# Patient Record
Sex: Male | Born: 1945 | Race: White | Hispanic: No | Marital: Married | Smoking: Never smoker
Health system: Southern US, Community
[De-identification: ages and names within clinical notes are randomized; demographics above are authoritative.]

## PROBLEM LIST (undated history)

## (undated) DIAGNOSIS — R351 Nocturia: Secondary | ICD-10-CM

## (undated) DIAGNOSIS — J302 Other seasonal allergic rhinitis: Secondary | ICD-10-CM

## (undated) DIAGNOSIS — K222 Esophageal obstruction: Secondary | ICD-10-CM

## (undated) DIAGNOSIS — M1990 Unspecified osteoarthritis, unspecified site: Secondary | ICD-10-CM

## (undated) DIAGNOSIS — G43909 Migraine, unspecified, not intractable, without status migrainosus: Secondary | ICD-10-CM

## (undated) DIAGNOSIS — Z91018 Allergy to other foods: Secondary | ICD-10-CM

## (undated) DIAGNOSIS — I10 Essential (primary) hypertension: Secondary | ICD-10-CM

## (undated) DIAGNOSIS — C61 Malignant neoplasm of prostate: Secondary | ICD-10-CM

## (undated) HISTORY — DX: Essential (primary) hypertension: I10

## (undated) HISTORY — PX: POLYPECTOMY: SHX149

## (undated) HISTORY — DX: Other seasonal allergic rhinitis: J30.2

## (undated) HISTORY — DX: Migraine, unspecified, not intractable, without status migrainosus: G43.909

## (undated) HISTORY — DX: Unspecified osteoarthritis, unspecified site: M19.90

## (undated) HISTORY — DX: Malignant neoplasm of prostate: C61

## (undated) HISTORY — DX: Esophageal obstruction: K22.2

---
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
## 2001-03-06 ENCOUNTER — Other Ambulatory Visit: Admission: RE | Admit: 2001-03-06 | Discharge: 2001-03-06 | Payer: Self-pay | Admitting: Gastroenterology

## 2001-03-06 ENCOUNTER — Encounter (INDEPENDENT_AMBULATORY_CARE_PROVIDER_SITE_OTHER): Payer: Self-pay

## 2002-01-31 ENCOUNTER — Encounter: Payer: Self-pay | Admitting: Internal Medicine

## 2003-03-27 ENCOUNTER — Ambulatory Visit (HOSPITAL_BASED_OUTPATIENT_CLINIC_OR_DEPARTMENT_OTHER): Admission: RE | Admit: 2003-03-27 | Discharge: 2003-03-27 | Payer: Self-pay | Admitting: Orthopedic Surgery

## 2003-03-27 HISTORY — PX: WRIST ARTHROSCOPY: SUR100

## 2004-11-26 ENCOUNTER — Ambulatory Visit: Payer: Self-pay | Admitting: Internal Medicine

## 2004-12-08 ENCOUNTER — Ambulatory Visit: Payer: Self-pay | Admitting: Internal Medicine

## 2005-01-13 ENCOUNTER — Ambulatory Visit: Payer: Self-pay | Admitting: Internal Medicine

## 2005-02-12 ENCOUNTER — Ambulatory Visit: Payer: Self-pay | Admitting: Internal Medicine

## 2005-02-16 ENCOUNTER — Ambulatory Visit: Payer: Self-pay | Admitting: Internal Medicine

## 2005-04-13 ENCOUNTER — Ambulatory Visit: Payer: Self-pay | Admitting: Gastroenterology

## 2005-05-03 ENCOUNTER — Ambulatory Visit: Payer: Self-pay | Admitting: Gastroenterology

## 2006-05-19 ENCOUNTER — Encounter (INDEPENDENT_AMBULATORY_CARE_PROVIDER_SITE_OTHER): Payer: Self-pay | Admitting: Specialist

## 2006-05-19 ENCOUNTER — Ambulatory Visit (HOSPITAL_COMMUNITY): Admission: RE | Admit: 2006-05-19 | Discharge: 2006-05-19 | Payer: Self-pay | Admitting: General Surgery

## 2006-05-19 HISTORY — PX: HEMORRHOID SURGERY: SHX153

## 2006-07-21 ENCOUNTER — Ambulatory Visit: Payer: Self-pay | Admitting: Internal Medicine

## 2006-12-05 ENCOUNTER — Ambulatory Visit: Payer: Self-pay | Admitting: Internal Medicine

## 2007-12-04 ENCOUNTER — Telehealth (INDEPENDENT_AMBULATORY_CARE_PROVIDER_SITE_OTHER): Payer: Self-pay | Admitting: *Deleted

## 2008-01-18 ENCOUNTER — Telehealth (INDEPENDENT_AMBULATORY_CARE_PROVIDER_SITE_OTHER): Payer: Self-pay | Admitting: *Deleted

## 2008-02-12 ENCOUNTER — Ambulatory Visit: Payer: Self-pay | Admitting: Internal Medicine

## 2008-02-12 DIAGNOSIS — E786 Lipoprotein deficiency: Secondary | ICD-10-CM

## 2008-02-12 DIAGNOSIS — R030 Elevated blood-pressure reading, without diagnosis of hypertension: Secondary | ICD-10-CM

## 2008-02-12 DIAGNOSIS — G43909 Migraine, unspecified, not intractable, without status migrainosus: Secondary | ICD-10-CM | POA: Insufficient documentation

## 2008-02-12 DIAGNOSIS — R51 Headache: Secondary | ICD-10-CM

## 2008-02-19 LAB — CONVERTED CEMR LAB
BUN: 12 mg/dL (ref 6–23)
PSA: 2.48 ng/mL (ref 0.10–4.00)

## 2008-02-20 ENCOUNTER — Encounter (INDEPENDENT_AMBULATORY_CARE_PROVIDER_SITE_OTHER): Payer: Self-pay | Admitting: *Deleted

## 2008-03-19 ENCOUNTER — Ambulatory Visit: Payer: Self-pay | Admitting: Internal Medicine

## 2008-03-19 ENCOUNTER — Encounter (INDEPENDENT_AMBULATORY_CARE_PROVIDER_SITE_OTHER): Payer: Self-pay | Admitting: *Deleted

## 2008-03-19 LAB — CONVERTED CEMR LAB
OCCULT 1: NEGATIVE
OCCULT 2: NEGATIVE
OCCULT 3: NEGATIVE

## 2008-12-02 ENCOUNTER — Ambulatory Visit: Payer: Self-pay | Admitting: Internal Medicine

## 2008-12-02 DIAGNOSIS — Z860100 Personal history of colon polyps, unspecified: Secondary | ICD-10-CM | POA: Insufficient documentation

## 2008-12-02 DIAGNOSIS — K5730 Diverticulosis of large intestine without perforation or abscess without bleeding: Secondary | ICD-10-CM

## 2008-12-03 ENCOUNTER — Ambulatory Visit: Payer: Self-pay | Admitting: Internal Medicine

## 2008-12-07 LAB — CONVERTED CEMR LAB
ALT: 17 units/L (ref 0–53)
Albumin: 3.9 g/dL (ref 3.5–5.2)
Alkaline Phosphatase: 50 units/L (ref 39–117)
Bilirubin, Direct: 0.1 mg/dL (ref 0.0–0.3)
Calcium: 9.1 mg/dL (ref 8.4–10.5)
Chloride: 110 meq/L (ref 96–112)
Cholesterol: 114 mg/dL (ref 0–200)
Creatinine, Ser: 1 mg/dL (ref 0.4–1.5)
Eosinophils Absolute: 0.2 10*3/uL (ref 0.0–0.7)
GFR calc non Af Amer: 81 mL/min
Glucose, Bld: 73 mg/dL (ref 70–99)
HDL: 30 mg/dL — ABNORMAL LOW (ref >39.0)
MCV: 91.2 fL (ref 78.0–100.0)
Monocytes Relative: 8.6 % (ref 3.0–12.0)
Neutro Abs: 3.2 10*3/uL (ref 1.4–7.7)
PSA: 2.07 ng/mL (ref 0.10–4.00)
Platelets: 167 10*3/uL (ref 150–400)
RBC: 4.47 M/uL (ref 4.22–5.81)
Total CHOL/HDL Ratio: 3.8
Triglycerides: 67 mg/dL (ref 0–149)

## 2008-12-09 ENCOUNTER — Encounter (INDEPENDENT_AMBULATORY_CARE_PROVIDER_SITE_OTHER): Payer: Self-pay | Admitting: *Deleted

## 2008-12-16 ENCOUNTER — Ambulatory Visit: Payer: Self-pay | Admitting: Internal Medicine

## 2008-12-16 LAB — CONVERTED CEMR LAB: OCCULT 3: NEGATIVE

## 2009-01-08 ENCOUNTER — Telehealth (INDEPENDENT_AMBULATORY_CARE_PROVIDER_SITE_OTHER): Payer: Self-pay | Admitting: *Deleted

## 2009-12-10 ENCOUNTER — Ambulatory Visit: Payer: Self-pay | Admitting: Internal Medicine

## 2009-12-22 ENCOUNTER — Telehealth (INDEPENDENT_AMBULATORY_CARE_PROVIDER_SITE_OTHER): Payer: Self-pay | Admitting: *Deleted

## 2009-12-27 DIAGNOSIS — D126 Benign neoplasm of colon, unspecified: Secondary | ICD-10-CM

## 2009-12-27 HISTORY — DX: Benign neoplasm of colon, unspecified: D12.6

## 2010-02-20 ENCOUNTER — Ambulatory Visit: Payer: Self-pay | Admitting: Family

## 2010-04-28 ENCOUNTER — Telehealth: Payer: Self-pay | Admitting: Gastroenterology

## 2010-05-14 ENCOUNTER — Encounter (INDEPENDENT_AMBULATORY_CARE_PROVIDER_SITE_OTHER): Payer: Self-pay | Admitting: *Deleted

## 2010-05-18 ENCOUNTER — Ambulatory Visit: Payer: Self-pay | Admitting: Gastroenterology

## 2010-05-29 ENCOUNTER — Ambulatory Visit: Payer: Self-pay | Admitting: Gastroenterology

## 2010-05-29 HISTORY — PX: COLONOSCOPY: SHX174

## 2010-06-01 ENCOUNTER — Encounter: Payer: Self-pay | Admitting: Gastroenterology

## 2010-06-15 ENCOUNTER — Telehealth (INDEPENDENT_AMBULATORY_CARE_PROVIDER_SITE_OTHER): Payer: Self-pay | Admitting: *Deleted

## 2010-09-11 ENCOUNTER — Encounter: Payer: Self-pay | Admitting: Internal Medicine

## 2010-10-19 ENCOUNTER — Telehealth (INDEPENDENT_AMBULATORY_CARE_PROVIDER_SITE_OTHER): Payer: Self-pay | Admitting: *Deleted

## 2010-11-27 ENCOUNTER — Telehealth: Payer: Self-pay | Admitting: Internal Medicine

## 2010-11-27 ENCOUNTER — Ambulatory Visit: Payer: Self-pay | Admitting: Internal Medicine

## 2010-11-27 LAB — CONVERTED CEMR LAB
Bilirubin Urine: NEGATIVE
Nitrite: NEGATIVE
Specific Gravity, Urine: <1.005
Urobilinogen, UA: 0.2
WBC Urine, dipstick: NEGATIVE

## 2010-11-30 ENCOUNTER — Encounter: Payer: Self-pay | Admitting: Internal Medicine

## 2010-12-01 ENCOUNTER — Ambulatory Visit: Payer: Self-pay | Admitting: Internal Medicine

## 2010-12-04 LAB — CONVERTED CEMR LAB
ALT: 22 units/L (ref 0–53)
Basophils Absolute: 0 10*3/uL (ref 0.0–0.1)
Basophils Relative: 0.8 % (ref 0.0–3.0)
Chloride: 109 meq/L (ref 96–112)
Cholesterol: 141 mg/dL (ref 0–200)
Eosinophils Relative: 2.3 % (ref 0.0–5.0)
GFR calc non Af Amer: 88.49 mL/min (ref >60.00)
Glucose, Bld: 87 mg/dL (ref 70–99)
HCT: 42.3 % (ref 39.0–52.0)
HDL: 28.8 mg/dL — ABNORMAL LOW (ref >39.00)
Hemoglobin: 14.6 g/dL (ref 13.0–17.0)
Lymphs Abs: 2 10*3/uL (ref 0.7–4.0)
MCV: 92.4 fL (ref 78.0–100.0)
PSA: 3.49 ng/mL (ref 0.10–4.00)
RDW: 13.4 % (ref 11.5–14.6)
Sodium: 141 meq/L (ref 135–145)
Total Bilirubin: 1 mg/dL (ref 0.3–1.2)
Total CHOL/HDL Ratio: 5
Triglycerides: 99 mg/dL (ref 0.0–149.0)
VLDL: 19.8 mg/dL (ref 0.0–40.0)
WBC: 5.7 10*3/uL (ref 4.5–10.5)

## 2010-12-10 ENCOUNTER — Telehealth (INDEPENDENT_AMBULATORY_CARE_PROVIDER_SITE_OTHER): Payer: Self-pay | Admitting: *Deleted

## 2010-12-24 ENCOUNTER — Telehealth (INDEPENDENT_AMBULATORY_CARE_PROVIDER_SITE_OTHER): Payer: Self-pay | Admitting: *Deleted

## 2010-12-29 ENCOUNTER — Ambulatory Visit
Admission: RE | Admit: 2010-12-29 | Discharge: 2010-12-29 | Payer: Self-pay | Source: Home / Self Care | Attending: Internal Medicine | Admitting: Internal Medicine

## 2011-01-24 LAB — CONVERTED CEMR LAB
Blood in Urine, dipstick: NEGATIVE
Glucose, Urine, Semiquant: NEGATIVE
Specific Gravity, Urine: <1.005
WBC Urine, dipstick: NEGATIVE

## 2011-01-28 NOTE — Progress Notes (Signed)
Summary: New RX request for travel   Phone Note Call from Patient Call back at [phone]   Caller: Patient Summary of Call: Message left on voicemail: [place] 13[place]14 [place], request rx for Dimox. Please call into [place] Initial call taken by: Shonna Chock CMA,  December 24, 2010 3:27 PM  Follow-up for Phone Call        Acetazolamide  XR 500 mg once daily as directed ( check to see how many he'll need for [place]. It should be same # as in 12/2008. Pharmacy should have on record # Rxed for him & his wife then. He'll obviously need only 1/2 that #)) Follow-up by: Marga Melnick MD,  December 25, 2010 5:11 AM  Additional Follow-up for Phone Call Additional follow up Details #1::        I called the patient's wife to inform her rx to be sent in and she indicated # 14 would be good for the dispense number although he may not need daily. Patient will be there for 2 weeks  Additional Follow-up by: Shonna Chock CMA,  December 25, 2010 12:31 PM    New/Updated Medications: ACETAZOLAMIDE 500 MG XR12H-CAP (ACETAZOLAMIDE) once daily as directed Prescriptions: ACETAZOLAMIDE 500 MG XR12H-CAP (ACETAZOLAMIDE) once daily as directed  #14 x 0   Entered by:   Shonna Chock CMA   Authorized by:   Marga Melnick MD   Signed by:   Shonna Chock CMA on 12/25/2010   Method used:   Printed then faxed to ...       [facility] (retail)       [address]       [place], [place]  [address]       Ph: [phone] or [phone]       Fax: [phone]   RxID:   [id number]

## 2011-01-28 NOTE — Progress Notes (Signed)
Summary: REFILL REQUEST  Phone Note Refill Request Call back at [phone] Message from:  Pharmacy on June 15, 2010 10:34 AM  Refills Requested: Medication #1:  TIAZAC 120 MG  CP24 1 by mouth qd   Dosage confirmed as above?Dosage Confirmed   Supply Requested: 3 months   Last Refilled: 02/27/2010 [facility]  Next Appointment Scheduled: NONE Initial call taken by: Lavell Islam,  June 15, 2010 10:34 AM    Prescriptions: TIAZAC 120 MG  CP24 (DILTIAZEM HCL ER BEADS) 1 by mouth qd  #90 x 1   Entered by:   Shonna Chock   Authorized by:   Marga Melnick MD   Signed by:   Shonna Chock on 06/15/2010   Method used:   Faxed to ...       [facility] (retail)       [address]       [place], [place]  [address]       Ph: [phone] or [phone]       Fax: [phone]   RxID:   [id number]

## 2011-01-28 NOTE — Procedures (Signed)
Summary: Colonoscopy  Patient: Brian [name] Note: All result statuses are Final unless otherwise noted.  Tests: (1) Colonoscopy (COL)   COL Colonoscopy           DONE (C)     [facility]     [address]     [place], [place]  [address]           COLONOSCOPY PROCEDURE REPORT           PATIENT:  [name], [name]  MR#:  [id number]     BIRTHDATE:  Jan 15, 1946, 63 yrs. old  GENDER:  male     ENDOSCOPIST:  Judie Petit T. Russella Dar, MD, [facility]           PROCEDURE DATE:  05/29/2010     PROCEDURE:  Colonoscopy with biopsy and snare polypectomy     ASA CLASS:  Class II     INDICATIONS:  1) Elevated Risk Screening, family Hx of     polyps-father with colon polyps at age 20.     MEDICATIONS:   Fentanyl 75 mcg IV, Versed 8 mg IV     DESCRIPTION OF PROCEDURE:   After the risks benefits and     alternatives of the procedure were thoroughly explained, informed     consent was obtained.  Digital rectal exam was performed and     revealed no abnormalities.   The LB PCF-H180AL [id number] endoscope     was introduced through the anus and advanced to the cecum, which     was identified by both the appendix and ileocecal valve, without     limitations.  The quality of the prep was excellent, using     MoviPrep.  The instrument was then slowly withdrawn as the colon     was fully examined.     <<PROCEDUREIMAGES>>     FINDINGS:  A sessile polyp was found in the ascending colon. It     was 5 mm in size. Polyp was snared without cautery. Retrieval was     successful. A sessile polyp was found in the distal transverse     colon. It was 3 mm in size. The polyp was removed using cold     biopsy forceps. Moderate diverticulosis was found in the sigmoid     colon. This was otherwise a normal examination of the colon.     Retroflexed views in the rectum revealed internal hemorrhoids,     small. The time to cecum =  2.33  minutes. The scope was then     withdrawn (time =  13  min) from the patient and the  procedure     completed.           COMPLICATIONS:  None           ENDOSCOPIC IMPRESSION:     1) 5 mm sessile polyp in the ascending colon     2) 3 mm sessile polyp in the distal transverse colon     3) Moderate diverticulosis in the sigmoid colon     4) Internal hemorrhoids           RECOMMENDATIONS:     1) Await pathology results     2) High fiber diet with liberal fluid intake.     3) Repeat Colonoscopy in 5 years pending pathology review.           Venita Lick. Russella Dar, MD, [facility]           CC: Pecola Lawless, MD  n.     REVISED:  05/29/2010 10:32 AM     eSIGNED:   Judie Petit T. Scout Guyett at 05/29/2010 10:32 AM           Consuello Closs, [id number]  Note: An exclamation mark (!) indicates a result that was not dispersed into the flowsheet. Document Creation Date: 05/29/2010 10:32 AM _______________________________________________________________________  (1) Order result status: Final Collection or observation date-time: 05/29/2010 10:16 Requested date-time:  Receipt date-time:  Reported date-time:  Referring Physician:   Ordering Physician: Claudette Head [id number]) Specimen Source:  Source: Launa Grill Order Number: [id number] Lab site:   Appended Document: Colonoscopy     Procedures Next Due Date:    Colonoscopy: 05/2015

## 2011-01-28 NOTE — Progress Notes (Signed)
Summary: Triage   Phone Note Call from Patient Call back at Work Phone [phone]   Caller: Patient Call For: Dr. Russella Dar Reason for Call: Talk to Nurse Summary of Call: pt.'s recall colon is not until 2016. He is sch'd for June 2011 and wants to know if he can keep his appt. and have his procedure this year. Initial call taken by: Karna Christmas,  Apr 28, 2010 12:51 PM  Follow-up for Phone Call        You recently changed his recall from this year to 2016 .  He had already scheduled a colon prior to your reccall review and would like to go ahead with it.  He has no current GI problems or complaints.  He has also discussed with Dr Alwyn Ren and he has recommended he go through with scheduled colon.  I have put the chart back in your office if you need to review further. Patient's wife was recently diagnosed with 2 seperate types of cancer and is very anxious for himself.  Please advise if this is a problem. Follow-up by: Darcey Nora RN, CGRN,  Apr 28, 2010 2:02 PM  Additional Follow-up for Phone Call Additional follow up Details #1::        After reviewing his colonoscopy reports and pathology reports from 04/2005, 04/2002 and 02/2001 and, no adenomatous (precancerous) polyps have been found so the guidelines would recommend a 10 year screening interval. A 5 year interval would be recommended if he had an adenomatous polyp. If he would like to go ahead with a screening colonoscopy sooner this time that is OK with me. Additional Follow-up by: Meryl Dare MD [facility],  Apr 28, 2010 3:32 PM    Additional Follow-up for Phone Call Additional follow up Details #2::    patient would like to proceed with colon as scheduled. Follow-up by: Darcey Nora RN, CGRN,  Apr 28, 2010 3:35 PM

## 2011-01-28 NOTE — Progress Notes (Signed)
Summary: Refill   Phone Note Refill Request Message from:  Fax from Pharmacy on December 10, 2010 12:02 PM  Refills Requested: Medication #1:  TIAZAC 120 MG  CP24 1 by mouth qd [facility] - fax [phone]  Initial call taken by: Okey Regal Spring,  December 10, 2010 12:03 PM    Prescriptions: TIAZAC 120 MG  CP24 (DILTIAZEM HCL ER BEADS) 1 by mouth qd  #90 x 2   Entered and Authorized by:   Shonna Chock CMA   Signed by:   Shonna Chock CMA on 12/10/2010   Method used:   Printed then faxed to ...       [facility] (retail)       [address]       [place], [place]  [address]       Ph: [phone] or [phone]       Fax: [phone]   RxID:   [id number]

## 2011-01-28 NOTE — Letter (Signed)
Summary: Cancer Screening/[facility] Personalized Risk Profile  Cancer Screening/[facility] Personalized Risk Profile   Imported By: Lanelle Bal 12/07/2010 12:07:34  _____________________________________________________________________  External Attachment:    Type:   Image     Comment:   External Document

## 2011-01-28 NOTE — Miscellaneous (Signed)
Summary: [facility] Previsit/prep  Clinical Lists Changes  Medications: Added new medication of MOVIPREP 100 GM  SOLR (PEG-KCL-NACL-NASULF-NA ASC-C) As per prep instructions. - Signed Rx of MOVIPREP 100 GM  SOLR (PEG-KCL-NACL-NASULF-NA ASC-C) As per prep instructions.;  #1 x 0;  Signed;  Entered by: Wyona Almas RN;  Authorized by: Meryl Dare MD [facility];  Method used: Print then Give to Patient Observations: Added new observation of NKA: T (05/18/2010 9:41)    Prescriptions: MOVIPREP 100 GM  SOLR (PEG-KCL-NACL-NASULF-NA ASC-C) As per prep instructions.  #1 x 0   Entered by:   Wyona Almas RN   Authorized by:   Meryl Dare MD [facility]   Signed by:   Wyona Almas RN on 05/18/2010   Method used:   Print then Give to Patient   RxID:   [id number]

## 2011-01-28 NOTE — Letter (Signed)
Summary: [facility] Instructions  Brian [name]  [address] [place], [place] [address]   Phone: [phone]  Fax: [phone]       Brian [name]    1946-10-07    MRN: [id number]        Procedure Day /Date:  05/29/10  Friday     Arrival Time:  9:00am     Procedure Time:  10:00am     Location of Procedure:                    _x _  [facility] (4th Floor)   PREPARATION FOR COLONOSCOPY WITH MOVIPREP   Starting 5 days prior to your procedure  05/24/10 do not eat nuts, seeds, popcorn, corn, beans, peas,  salads, or any raw vegetables.  Do not take any fiber supplements (e.g. Metamucil, Citrucel, and Benefiber).  THE DAY BEFORE YOUR PROCEDURE         DATE:   05/28/10  DAY:   Thursday  1.  Drink clear liquids the entire day-NO SOLID FOOD  2.  Do not drink anything colored red or purple.  Avoid juices with pulp.  No orange juice.  3.  Drink at least 64 oz. (8 glasses) of fluid/clear liquids during the day to prevent dehydration and help the prep work efficiently.  CLEAR LIQUIDS INCLUDE: Water Jello Ice Popsicles Tea (sugar ok, no milk/cream) Powdered fruit flavored drinks Coffee (sugar ok, no milk/cream) Gatorade Juice: apple, white grape, white cranberry  Lemonade Clear bullion, consomm, broth Carbonated beverages (any kind) Strained chicken noodle soup Hard Candy                             4.  In the morning, mix first dose of MoviPrep solution:    Empty 1 Pouch A and 1 Pouch B into the disposable container    Add lukewarm drinking water to the top line of the container. Mix to dissolve    Refrigerate (mixed solution should be used within 24 hrs)  5.  Begin drinking the prep at 5:00 p.m. The MoviPrep container is divided by 4 marks.   Every 15 minutes drink the solution down to the next mark (approximately 8 oz) until the full liter is complete.   6.  Follow completed prep with 16 oz of clear liquid of your choice (Nothing red or purple).   Continue to drink clear liquids until bedtime.  7.  Before going to bed, mix second dose of MoviPrep solution:    Empty 1 Pouch A and 1 Pouch B into the disposable container    Add lukewarm drinking water to the top line of the container. Mix to dissolve    Refrigerate  THE DAY OF YOUR PROCEDURE      DATE:   05/29/10  DAY:  Friday  Beginning at 5:00 a.m. (5 hours before procedure):         1. Every 15 minutes, drink the solution down to the next mark (approx 8 oz) until the full liter is complete.  2. Follow completed prep with 16 oz. of clear liquid of your choice.    3. You may drink clear liquids until 8:00am   (2 HOURS BEFORE PROCEDURE).   MEDICATION INSTRUCTIONS  Unless otherwise instructed, you should take regular prescription medications with a small sip of water   as early as possible the morning of your procedure.  OTHER INSTRUCTIONS  You will need a responsible adult at least 63 years of age to accompany you and drive you home.   This person must remain in the waiting room during your procedure.  Wear loose fitting clothing that is easily removed.  Leave jewelry and other valuables at home.  However, you may wish to bring a book to read or  an iPod/MP3 player to listen to music as you wait for your procedure to start.  Remove all body piercing jewelry and leave at home.  Total time from sign-in until discharge is approximately 2-3 hours.  You should go home directly after your procedure and rest.  You can resume normal activities the  day after your procedure.  The day of your procedure you should not:   Drive   Make legal decisions   Operate machinery   Drink alcohol   Return to work  You will receive specific instructions about eating, activities and medications before you leave.    The above instructions have been reviewed and explained to me by   Wyona Almas RN  May 18, 2010 10:04 AM     I fully understand and can verbalize  these instructions _____________________________ Date _________

## 2011-01-28 NOTE — Letter (Signed)
Summary: Patient Notice- Polyp Results  [facility]  [address] [place], [place] [address]   Phone: [phone]  Fax: [phone]        June 01, 2010 MRN: [id number]    Brian [name] [address] [place], [place]  [address]    Dear [name],  I am pleased to inform you that the colon polyp(s) removed during your recent colonoscopy was (were) found to be benign (no cancer detected) upon pathologic examination.  I recommend you have a repeat colonoscopy examination in 5 years to look for recurrent polyps, as having colon polyps increases your risk for having recurrent polyps or even colon cancer in the future.  Should you develop new or worsening symptoms of abdominal pain, bowel habit changes or bleeding from the rectum or bowels, please schedule an evaluation with either your primary care physician or with me.  Continue treatment plan as outlined the day of your exam.  Please call us if you are having persistent problems or have questions about your condition that have not been fully answered at this time.  Sincerely,  Meryl Dare MD [facility]  This letter has been electronically signed by your physician.  Appended Document: Patient Notice- Polyp Results letter mailed.

## 2011-01-28 NOTE — Letter (Signed)
[facility]   Imported By: Lanelle Bal 12/09/2010 14:37:57  _____________________________________________________________________  External Attachment:    Type:   Image     Comment:   External Document

## 2011-01-28 NOTE — Assessment & Plan Note (Signed)
Summary: SINUS INFECTION/KN   Vital Signs:  Patient profile:   65 year old male Weight:      158.8 pounds BMI:     24.06 Temp:     98.6 degrees F oral Pulse rate:   64 / minute Resp:     14 per minute BP sitting:   130 / 72  (left arm) Cuff size:   regular  Vitals Entered By: Shonna Chock CMA (December 29, 2010 9:23 AM) CC: URI symptoms   CC:  URI symptoms.  History of Present Illness:      This is a 65 year old man who presents with URI symptoms; onset 12/26 as ST.  The patient  now reports nasal congestion, purulent nasal discharge, and productive cough, but denies sore throat and earache.  The patient denies fever, dyspnea, and wheezing.  The patient denies headache & bilateral facial pain, tooth pain, and tender adenopathy.  Rx: Zicam, Neti  pot two times a day .He is on a 12 day course of steroids  from Dr Charlann Boxer.  Current Medications (verified): 1)  Levitra 20 Mg Tabs (Vardenafil Hcl) 2)  Tiazac 120 Mg  Cp24 (Diltiazem Hcl Er Beads) .Marland Kitchen.. 1 By Mouth Qd 3)  Topamax 100 Mg  Tabs (Topiramate) .Marland Kitchen.. 1 By Mouth Qd 4)  Imitrex 100 Mg  Tabs (Sumatriptan Succinate) .... Prn 5)  Vit C 1000mg  .... 1/2 Tablet Twice A Day 6)  Multivitamin .... Take 1 Tablet By Mouth Once A Day 7)  Fish Oil .Marland Kitchen.. 1 Capsule Twice A Day 8)  Glucosamine Chondriotin .... One Tablet Twice A Day 9)  Prednisone 5 Mg Tabs (Prednisone) .... As Directed Per Dr.olin  Allergies (verified): No Known Drug Allergies  Physical Exam  General:  well-nourished,in no acute distress; alert,appropriate and cooperative throughout examination Eyes:  No corneal or conjunctival inflammation noted. EOMI. Perrla.Vision grossly normal. Ears:  External ear exam shows no significant lesions or deformities.  Otoscopic examination reveals clear canals, tympanic membranes are intact bilaterally without bulging, retraction, inflammation or discharge. Hearing is grossly normal bilaterally. Nose:  External nasal examination shows no  deformity or inflammation. Nasal mucosa are  mildly erythema without lesions or exudates. Septal dislocation to R Mouth:  Oral mucosa and oropharynx without lesions or exudates.  Teeth in good repair. Two tiny aphthous lesions of uvula Lungs:  Normal respiratory effort, chest expands symmetrically. Lungs are clear to auscultation, no crackles or wheezes. Heart:  bradycardia.   S4 Cervical Nodes:  No lymphadenopathy noted Axillary Nodes:  No palpable lymphadenopathy   Impression & Recommendations:  Problem # 1:  SINUSITIS- ACUTE-NOS (ICD-461.9)  His updated medication list for this problem includes:    Amoxicillin 500 Mg Caps (Amoxicillin) .Marland Kitchen... 1 three times a day    Fluticasone Propionate 50 Mcg/act Susp (Fluticasone propionate) .Marland Kitchen... 1 spray two times a day as needed after neti pot  Problem # 2:  BRONCHITIS-ACUTE (ICD-466.0)  His updated medication list for this problem includes:    Amoxicillin 500 Mg Caps (Amoxicillin) .Marland Kitchen... 1 three times a day  Complete Medication List: 1)  Levitra 20 Mg Tabs (Vardenafil hcl) 2)  Tiazac 120 Mg Cp24 (Diltiazem hcl er beads) .Marland Kitchen.. 1 by mouth qd 3)  Topamax 100 Mg Tabs (Topiramate) .Marland Kitchen.. 1 by mouth qd 4)  Imitrex 100 Mg Tabs (Sumatriptan succinate) .... Prn 5)  Vitamin C 500 Mg Tabs (Ascorbic acid) .Marland Kitchen.. 1 by mouth two times a day 6)  Multivitamin  .... Take 1 tablet by  mouth once a day 7)  Fish Oil  .Marland Kitchen.. 1 capsule twice a day 8)  Glucosamine Chondriotin  .... One tablet twice a day 9)  Prednisone 5 Mg Tabs (Prednisone) .... As directed per dr.olin 10)  Amoxicillin 500 Mg Caps (Amoxicillin) .Marland Kitchen.. 1 three times a day 11)  Fluticasone Propionate 50 Mcg/act Susp (Fluticasone propionate) .Marland Kitchen.. 1 spray two times a day as needed after neti pot  Patient Instructions: 1)  Drink as much NON dairy  fluid as you can tolerate for the next few days. Saline gargle as needed for sore throat. Prescriptions: FLUTICASONE PROPIONATE 50 MCG/ACT SUSP (FLUTICASONE  PROPIONATE) 1 spray two times a day as needed after Neti pot  #1 x 5   Entered and Authorized by:   Marga Melnick MD   Signed by:   Marga Melnick MD on 12/29/2010   Method used:   Faxed to ...       [facility] (retail)       [address]       [place], [place]  [address]       Ph: [phone] or [phone]       Fax: [phone]   RxID:   [id number] AMOXICILLIN 500 MG CAPS (AMOXICILLIN) 1 three times a day  #30 x 0   Entered and Authorized by:   Marga Melnick MD   Signed by:   Marga Melnick MD on 12/29/2010   Method used:   Faxed to ...       [facility] (retail)       [address]       [place], [place]  [address]       Ph: [phone] or [phone]       Fax: [phone]   RxID:   [id number]    Orders Added: 1)  Est. Patient Level III [485[address]]

## 2011-01-28 NOTE — Letter (Signed)
Summary: Medical & Student Pilot Certificate/[employer]  Medical & Student Pilot Certificate/[employer]   Imported By: Lanelle Bal 12/08/2010 10:55:52  _____________________________________________________________________  External Attachment:    Type:   Image     Comment:   External Document

## 2011-01-28 NOTE — Assessment & Plan Note (Signed)
Summary: FLIGHT PHYSICAL//cpx/KN   Vital Signs:  Patient profile:   65 year old male Height:      68.25 inches (173.35 cm) Weight:      160.38 pounds (72.90 kg) BMI:     24.29 Temp:     98.3 degrees F (36.83 degrees C) oral Resp:     14 per minute BP sitting:   114 / 70  (left arm)  Vitals Entered By: Lucious Groves CMA (November 27, 2010 8:45 AM) CC: Flight CPX./kb Is Patient Diabetic? No Pain Assessment Patient in pain? no       Vision Screening:Left eye w/o correction: 20 / 70 Right Eye w/o correction: 20 / 70 Both eyes w/o correction:  20/ 50 Left eye with correction: 20 / 20 Right eye with correction: 20 / 25 Both eyes with correction: 20 / 20  Color vision testing: normal      Vision Entered By: Lucious Groves CMA (November 27, 2010 8:47 AM)  Hearing Screen  20db HL: Left  500 hz: 20db 1000 hz: 20db 2000 hz: 20db 4000 hz: 20db Right  500 hz: 20db 1000 hz: 20db 2000 hz: 20db 4000 hz: 20db   Hearing Testing Entered By: Lucious Groves CMA (November 27, 2010 9:44 AM) 25db HL: Left  500 hz: 20db 1000 hz: 20db 2000 hz: 20db 4000 hz: 20db Right  500 hz: 20db 1000 hz: 20db 2000 hz: 20db 4000 hz: 20db  40db HL: Left  500 hz: 20db 1000 hz: 20db 2000 hz: 20db 4000 hz: 20db Right  500 hz: 20db 1000 hz: 20db 2000 hz: 20db 4000 hz: 20db    CC:  Flight CPX./kb.  History of Present Illness:        Brian [name] is here for a physical; he is asymptomatic. Hypertension Follow-Up:      The patient reports headaches which are stable & controlled on present medical regimen, but he  denies lightheadedness, urinary frequency, edema, and fatigue.  The patient denies the following associated symptoms: chest pain, chest pressure, exercise intolerance, dyspnea, palpitations, and syncope.  Compliance with medications (by patient report) has been near 100%.  The patient reports that dietary compliance has been excellent.  The patient reports exercising occasionally.   Adjunctive measures currently used by the patient include salt restriction.   BP averages in 120s/70s.  Current Medications (verified): 1)  Levitra 20 Mg Tabs (Vardenafil Hcl) 2)  Tiazac 120 Mg  Cp24 (Diltiazem Hcl Er Beads) .Marland Kitchen.. 1 By Mouth Qd 3)  Topamax 100 Mg  Tabs (Topiramate) .Marland Kitchen.. 1 By Mouth Qd 4)  Imitrex 100 Mg  Tabs (Sumatriptan Succinate) .... Prn 5)  Vit C 1000mg  .... 1/2 Tablet Twice A Day 6)  Multivitamin .... Take 1 Tablet By Mouth Once A Day 7)  Fish Oil .Marland Kitchen.. 1 Capsule Twice A Day 8)  Glucosamine Chondriotin .... 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 Colonic polyps,PMH  of  Diverticulosis, colon 2006  Past Surgical History: Tonsillectomy Hand surgery 03/2003 colonoscopy tics 04/2005 hemorrhoid surgery 04/2006 Colon polypectomy 2002, 2011; Tics & hemorrhoids 2006;  Dr Russella Dar Vasectomy  Family History: mother: diabetes,htn,  CHF maternal grandmother: diabetes father : CHF, vauvular heart disease daughter: thyroid nodule brother :elevated  PSA with negative  biopsy first cousin: prostate cancer  Social History: Never Smoked Alcohol  use-no Heart healthy diet Occupation: Retail banker Married Recurrent mission trips to [place]  Review of Systems  The patient denies anorexia, fever, weight loss, weight gain, vision loss, decreased hearing, hoarseness, prolonged cough, hemoptysis, abdominal pain, melena, hematochezia, severe indigestion/heartburn, hematuria, incontinence, suspicious skin lesions, depression, unusual weight change, abnormal bleeding, enlarged lymph nodes, and angioedema.    Physical Exam  General:  Thin,well-nourished;alert,appropriate and cooperative throughout examination Head:  Normocephalic and atraumatic without obvious  abnormalities. No apparent alopecia  Eyes:  No corneal or conjunctival inflammation noted. EOMI. Perrla. Funduscopic exam benign, without hemorrhages, exudates or papilledema. Field of  Vision grossly normal. Ears:  External ear exam shows no significant lesions or deformities.  Otoscopic examination reveals clear canals, tympanic membranes are intact bilaterally without bulging, retraction, inflammation or discharge. Hearing is grossly normal bilaterally. Nose:  External nasal examination shows no deformity or inflammation. Nasal mucosa are pink and moist without lesions or exudates. septal dislocation Mouth:  Oral mucosa and oropharynx without lesions or exudates.  Teeth in good repair. Neck:  No deformities, masses, or tenderness noted. Lungs:  Normal respiratory effort, chest expands symmetrically. Lungs are clear to auscultation, no crackles or wheezes. Heart:  regular rhythm, no murmur, no gallop, no rub, no JVD, no HJR, and bradycardia.   Abdomen:  Bowel sounds positive,abdomen soft and non-tender without masses, organomegaly or hernias noted. Rectal:  No external abnormalities noted. Normal sphincter tone. No rectal masses or tenderness. Genitalia:  Testes bilaterally descended without nodularity, tenderness or masses. No scrotal masses or lesions. No penis lesions or urethral discharge. Prostate:  Prostate gland firm and smooth,mild   enlargement, w/o nodularity, tenderness, mass, asymmetry or induration. Msk:  No deformity or scoliosis noted of thoracic or lumbar spine.   Pulses:  R and L carotid,radial,dorsalis pedis and posterior tibial pulses are full and equal bilaterally Extremities:  No clubbing, cyanosis, edema, or deformity noted with normal full range of motion of all joints.   Neurologic:  alert & oriented X3, gait normal, DTRs symmetrical and normal, finger-to-nose normal, and Romberg negative.   Skin:  Intact without suspicious lesions or rashes.  Well healed  scars R elbow & L  forearm Cervical Nodes:  No lymphadenopathy noted Axillary Nodes:  No palpable lymphadenopathy Inguinal Nodes:  No significant adenopathy Psych:  memory intact for recent and remote, normally interactive, and good eye contact.     Impression & Recommendations:  Problem # 1:  ROUTINE GENERAL MEDICAL EXAM@HEALTH  CARE FACL (ICD-V70.0)  FAA exam  Orders: EKG w/ Interpretation (93000): stable EKG compared to 2003 UA Dipstick w/o Micro (manual) (81191) EKG w/ Interpretation (93000)  Problem # 2:  HYPERTENSION, ESSENTIAL NOS (ICD-401.9)  His updated medication list for this problem includes:    Tiazac 120 Mg Cp24 (Diltiazem hcl er beads) .Marland Kitchen... 1 by mouth qd  Problem # 3:  COLONIC POLYPS, HX OF (ICD-V12.72)  Problem # 4:  SPECIAL SCREENING MALIGNANT NEOPLASM OF PROSTATE (ICD-V76.44)  Problem # 5:  HEADACHE (ICD-784.0) controlled His updated medication list for this problem includes:    Imitrex 100 Mg Tabs (Sumatriptan succinate) .Marland Kitchen... Prn  Complete Medication List: 1)  Levitra 20 Mg Tabs (Vardenafil hcl) 2)  Tiazac 120 Mg Cp24 (Diltiazem hcl er beads) .Marland Kitchen.. 1 by mouth qd 3)  Topamax 100 Mg Tabs (Topiramate) .Marland Kitchen.. 1 by mouth qd 4)  Imitrex 100 Mg Tabs (Sumatriptan succinate) .... Prn 5)  Vit C 1000mg   .... 1/2 tablet twice a day 6)  Multivitamin  .... Take 1  tablet by mouth once a day 7)  Fish Oil  .Marland Kitchen.. 1 capsule twice a day 8)  Glucosamine Chondriotin  .... One tablet twice a day  Patient Instructions: 1)  Please schedule fasting labs: 2)  BMP ; 3)  Hepatic Panel; 4)  Lipid Panel; 5)  TSH ;: 6)  CBC w/ Diff; 7)  PSA . 8)  Check your Blood Pressure regularly. If it is [phone] ON AVERAGE : you should make an appointment. 9)  It is important that you exercise regularly at least 20 minutes 5 times a week. If you develop chest pain, have severe difficulty breathing, or feel very tired , stop exercising immediately and seek medical attention. 10)  Take an 81 mg coated  Aspirin  every day.   Orders Added: 1)  Est. Patient 40-64 years [99396] 2)  EKG w/ Interpretation [93000] 3)  UA Dipstick w/o Micro (manual) [81002] 4)  EKG w/ Interpretation [93000]     Laboratory Results   Urine Tests  Date/Time Received: Lucious Groves [facility]  November 27, 2010 10:26 AM Date/Time Reported: Lucious Groves CMA  November 27, 2010 10:26 AM  Routine Urinalysis   Color: lt. yellow Appearance: Clear Glucose: negative   (Normal Range: Negative) Bilirubin: negative   (Normal Range: Negative) Ketone: negative   (Normal Range: Negative) Spec. Gravity: <1.005   (Normal Range: 1.003-1.035) Blood: negative   (Normal Range: Negative) pH: 6.5   (Normal Range: 5.0-8.0) Protein: negative   (Normal Range: Negative) Urobilinogen: 0.2   (Normal Range: 0-1) Nitrite: negative   (Normal Range: Negative) Leukocyte Esterace: negative   (Normal Range: Negative)

## 2011-01-28 NOTE — Progress Notes (Signed)
Summary: records  Phone Note Call from Patient   Summary of Call: Patient would like to know how many years back you need of his HA history. Please advise. Initial call taken by: Lucious Groves CMA,  November 27, 2010 12:17 PM  Follow-up for Phone Call        just last appt  Follow-up by: Marga Melnick MD,  November 27, 2010 1:14 PM  Additional Follow-up for Phone Call Additional follow up Details #1::        Left message on machine to call back to office.  Additional Follow-up by: Lucious Groves CMA,  November 27, 2010 4:36 PM

## 2011-01-28 NOTE — Assessment & Plan Note (Signed)
Summary: bad cough,coughing thin yellow tinged sputum//lch   Vital Signs:  Patient profile:   65 year old male Weight:      158 pounds BMI:     23.93 Temp:     98.5 degrees F oral Pulse rate:   64 / minute Pulse rhythm:   regular Resp:     16 per minute BP sitting:   132 / 90  (right arm) Cuff size:   regular CC: room 5  Cough since Sunday night Comments Cough x 6 days wosening; now productive and has sinus drainage.   CC:  room 5  Cough since Sunday night.  History of Present Illness: Brian [name] is a 65 year old male who presents with c/o nasl drainage and cough.  Notes wife has had similar symptoms x 3 weeks.  Started with "ticklish" cough sunday,  increased nasal discharge last night. He slept upright in the recliner.  Notes he has pleuritic discomfort from all the coughing.  He id going on vacation in 11 days and hopes to be better by then.  Nasal discharge is generally clear.  Cough is productive of liquidy phlegm, but unable to expectorate phlegm.  Pt notes temp was 99 the afternoon.  Denies chills or feeling like he has a fever.  Throat a litte sore, denies ear pain.  Patient is a non-smoker.    Allergies (verified): No Known Drug Allergies  Physical Exam  General:  Well-developed,well-nourished,in no acute distress; alert,appropriate and cooperative throughout examination Head:  Normocephalic and atraumatic without obvious abnormalities. No apparent alopecia or balding. Eyes:  PERRLA Ears:  External ear exam shows no significant lesions or deformities.  Otoscopic examination reveals clear canals, tympanic membranes are intact bilaterally without bulging, retraction, inflammation or discharge. Hearing is grossly normal bilaterally. Neck:  No deformities, masses, or tenderness noted. Lungs:  Normal respiratory effort, chest expands symmetrically. Lungs are clear to auscultation, no crackles or wheezes. Heart:  Normal rate and regular rhythm. S1 and S2 normal without gallop,  murmur, click, rub or other extra sounds.   Impression & Recommendations:  Problem # 1:  BRONCHITIS (ICD-490) Will plan treatment with zithromax and tussionex as needed cough  The following medications were removed from the medication list:    Cefuroxime Axetil 500 Mg Tabs (Cefuroxime axetil) .Marland Kitchen... 1 two times a day His updated medication list for this problem includes:    Zithromax Z-pak 250 Mg Tabs (Azithromycin) .Marland Kitchen..Marland Kitchen Two tabs by mouth today, then one tab by mouth daily x 4 more days    Tussionex Pennkinetic Er 8-10 Mg/26ml Lqcr (Chlorpheniramine-hydrocodone) ..... One teaspoon by mouth every 12 hour as needed for cough  Complete Medication List: 1)  Levitra 20 Mg Tabs (Vardenafil hcl) 2)  Tiazac 120 Mg Cp24 (Diltiazem hcl er beads) .Marland Kitchen.. 1 by mouth qd 3)  Topamax 100 Mg Tabs (Topiramate) .Marland Kitchen.. 1 by mouth qd 4)  Imitrex 100 Mg Tabs (Sumatriptan succinate) .... Prn 5)  Vit C 1000mg   .... 1/2 tablet twice a day 6)  Multivitamin  .... Take 1 tablet by mouth once a day 7)  Fish Oil  .Marland Kitchen.. 1 capsule twice a day 8)  Glucosamine Chondriotin  .... One tablet twice a day 9)  Zithromax Z-pak 250 Mg Tabs (Azithromycin) .... Two tabs by mouth today, then one tab by mouth daily x 4 more days 10)  Tussionex Pennkinetic Er 8-10 Mg/74ml Lqcr (Chlorpheniramine-hydrocodone) .... One teaspoon by mouth every 12 hour as needed for cough  Patient Instructions: 1)  Call if fever over 101, worsening cough, or if your symptoms are not resolved in 1 week.   Prescriptions: TUSSIONEX PENNKINETIC ER 8-10 MG/5ML LQCR (CHLORPHENIRAMINE-HYDROCODONE) one teaspoon by mouth every 12 hour as needed for cough  #100 x 0   Entered and Authorized by:   Lemont Fillers FNP   Signed by:   Lemont Fillers FNP on 02/20/2010   Method used:   Print then Give to Patient   RxID:   [id number] ZITHROMAX Z-PAK 250 MG TABS (AZITHROMYCIN) two tabs by mouth today, then one tab by mouth daily x 4 more days  #1 pack x 0    Entered and Authorized by:   Lemont Fillers FNP   Signed by:   Lemont Fillers FNP on 02/20/2010   Method used:   Print then Give to Patient   RxID:   [id number]   Current Allergies (reviewed today): No known allergies

## 2011-01-28 NOTE — Progress Notes (Signed)
Summary: refill  Phone Note Refill Request Message from:  Fax from Pharmacy on October 19, 2010 8:28 AM  Refills Requested: Medication #1:  LEVITRA 20 MG TABS [facility] [address] - fax [phone]  Initial call taken by: Okey Regal Spring,  October 19, 2010 8:28 AM    Prescriptions: LEVITRA 20 MG TABS (VARDENAFIL HCL)   #9 x 3   Entered by:   Shonna Chock CMA   Authorized by:   Marga Melnick MD   Signed by:   Shonna Chock CMA on 10/19/2010   Method used:   Faxed to ...       [facility]  [address] [phone][facility] (retail)       [address]       [place], [place]  [id number]       Ph: [phone] or [phone]       Fax: [phone]   RxID:   [id number]

## 2011-02-18 ENCOUNTER — Ambulatory Visit (INDEPENDENT_AMBULATORY_CARE_PROVIDER_SITE_OTHER): Payer: BC Managed Care – PPO | Admitting: Internal Medicine

## 2011-02-18 ENCOUNTER — Encounter: Payer: Self-pay | Admitting: Internal Medicine

## 2011-02-18 DIAGNOSIS — J321 Chronic frontal sinusitis: Secondary | ICD-10-CM | POA: Insufficient documentation

## 2011-02-23 NOTE — Assessment & Plan Note (Signed)
Summary: congested/cbs   Vital Signs:  Patient profile:   65 year old male Weight:      154 pounds BMI:     23.33 Temp:     98.4 degrees F oral Pulse rate:   64 / minute Resp:     14 per minute BP sitting:   118 / 76  (left arm) Cuff size:   large  Vitals Entered By: Shonna Chock CMA (February 18, 2011 11:42 AM) CC: Congestion(discolored-yellow), cough, and runny nose since last Friday , URI symptoms   CC:  Congestion(discolored-yellow), cough, and runny nose since last Friday , and URI symptoms.  History of Present Illness:    Onset as ST 02/18  upon awakening ; he now purulent nasal discharge from L nostril  and dry cough, but denies earache.  The patient denies fever, dyspnea, and wheezing.  The patient also reports L frontal headache.  The patient denies the following risk factors for Strep sinusitis: unilateral facial pain, tooth pain, and tender adenopathy.  Rx: Neti pot , Zicam  Current Medications (verified): 1)  Levitra 20 Mg Tabs (Vardenafil Hcl) 2)  Tiazac 120 Mg  Cp24 (Diltiazem Hcl Er Beads) .Marland Kitchen.. 1 By Mouth Qd 3)  Topamax 100 Mg  Tabs (Topiramate) .Marland Kitchen.. 1 By Mouth Qd 4)  Imitrex 100 Mg  Tabs (Sumatriptan Succinate) .... Prn 5)  Vitamin C 500 Mg Tabs (Ascorbic Acid) .Marland Kitchen.. 1 By Mouth Two Times A Day 6)  Multivitamin .... Take 1 Tablet By Mouth Once A Day 7)  Fish Oil .Marland Kitchen.. 1 Capsule Twice A Day 8)  Glucosamine Chondriotin .... One Tablet Twice A Day 9)  Fluticasone Propionate 50 Mcg/act Susp (Fluticasone Propionate) .Marland Kitchen.. 1 Spray Two Times A Day As Needed After Neti Pot  Allergies (verified): No Known Drug Allergies  Physical Exam  General:  Thin,well-nourished,in no acute distress; alert,appropriate and cooperative throughout examination Ears:  External ear exam shows no significant lesions or deformities.  Otoscopic examination reveals clear canals, tympanic membranes are intact bilaterally without bulging, retraction, inflammation or discharge. Hearing is grossly  normal bilaterally. Nose:  External nasal examination shows no deformity or inflammation. Nasal mucosa are  dry without lesions or exudates. Septal dislocation Mouth:  Oral mucosa and oropharynx without lesions or exudates.  Teeth in good repair. Lungs:  Normal respiratory effort, chest expands symmetrically. Lungs are clear to auscultation, no crackles or wheezes. Cervical Nodes:  No lymphadenopathy noted Axillary Nodes:  No palpable lymphadenopathy   Impression & Recommendations:  Problem # 1:  SINUSITIS, FRONTAL (ICD-473.1)  The following medications were removed from the medication list:    Amoxicillin 500 Mg Caps (Amoxicillin) .Marland Kitchen... 1 three times a day His updated medication list for this problem includes:    Fluticasone Propionate 50 Mcg/act Susp (Fluticasone propionate) .Marland Kitchen... 1 spray two times a day as needed after neti pot    Smz-tmp Ds 800-160 Mg Tabs (Sulfamethoxazole-trimethoprim) .Marland Kitchen... 1 two times a day with 8 oz of water  Complete Medication List: 1)  Levitra 20 Mg Tabs (Vardenafil hcl) 2)  Tiazac 120 Mg Cp24 (Diltiazem hcl er beads) .Marland Kitchen.. 1 by mouth qd 3)  Topamax 100 Mg Tabs (Topiramate) .Marland Kitchen.. 1 by mouth qd 4)  Imitrex 100 Mg Tabs (Sumatriptan succinate) .... Prn 5)  Vitamin C 500 Mg Tabs (Ascorbic acid) .Marland Kitchen.. 1 by mouth two times a day 6)  Multivitamin  .... Take 1 tablet by mouth once a day 7)  Fish Oil  .Marland Kitchen.. 1 capsule twice a day  8)  Glucosamine Chondriotin  .... One tablet twice a day 9)  Fluticasone Propionate 50 Mcg/act Susp (Fluticasone propionate) .Marland Kitchen.. 1 spray two times a day as needed after neti pot 10)  Smz-tmp Ds 800-160 Mg Tabs (Sulfamethoxazole-trimethoprim) .Marland Kitchen.. 1 two times a day with 8 oz of water  Patient Instructions: 1)  Neti pot once daily - two times a day as needed  for congestion. Prescriptions: SMZ-TMP DS 800-160 MG TABS (SULFAMETHOXAZOLE-TRIMETHOPRIM) 1 two times a day with 8 oz of water  #20 x 0   Entered and Authorized by:   Marga Melnick MD    Signed by:   Marga Melnick MD on 02/18/2011   Method used:   Electronically to        [facility]* (retail)       [address]       [place], [place]  [address]       Ph: [phone] or [phone]       Fax: [phone]   RxID:   [id number]    Orders Added: 1)  Est. Patient Level III [27253]

## 2011-05-04 ENCOUNTER — Ambulatory Visit (INDEPENDENT_AMBULATORY_CARE_PROVIDER_SITE_OTHER): Payer: BC Managed Care – PPO | Admitting: *Deleted

## 2011-05-04 DIAGNOSIS — Z23 Encounter for immunization: Secondary | ICD-10-CM

## 2011-05-14 NOTE — Op Note (Signed)
NAME:  CHIDI, SHIRER NO.:  [id number]   MEDICAL RECORD NO.:  [id number]                   PATIENT TYPE:  AMB   LOCATION:  [facility]                                  FACILITY:  [facility]   PHYSICIAN:  Cindee Salt, M.D.                    DATE OF BIRTH:  1946/12/24   DATE OF PROCEDURE:  03/27/2003  DATE OF DISCHARGE:                                 OPERATIVE REPORT   PREOPERATIVE DIAGNOSIS:  Scapholunate ligament tear, right wrist.   POSTOPERATIVE DIAGNOSIS:  Scapholunate ligament tear, right wrist.   PROCEDURE:  Arthroscopic inspection and debridement of partial tear volar  ulnar ligaments.  Complete scapholunate ligament tear, right wrist.   SURGEON:  Cindee Salt, M.D.   ASSISTANT:  Carolyne Fiscal, RN.   ANESTHESIA:  Axillary block.   INDICATIONS FOR PROCEDURE:  The patient is a 65 year old male who has had  wrist pain and a fall approximately 3-1/2 months ago injuring the right  wrist. He has an opening on his scapholunate ligament on x-ray.  MRI is  positive. He is admitted to inspect the proximal capitate. The scapholunate  ligament to see if partial tear is present and shrinkage can be performed.   DESCRIPTION OF PROCEDURE:  The patient is brought to the operating room and  axillary block carried out without difficulty.  He was prepped and draped  using Duraprep in the supine position with the right arm free. He was placed  in the arthroscopy tower with 10 pounds of traction applied. The joint  inflated through the 3/4 portal taking care to protect the EPL tendon. A  transverse incision was made, deepened with the hemostat, and blunt trocar  was used to enter the joint. The scope was introduced. Volar radial wrist  ligaments were intact. The scapholunate ligament tear was immediately  apparent.  The ulnar ligaments showed some fraying and stretching. A very  significant synovitis was present about the entire wrist. An irrigation  catheter was placed in 6U.  The 4/5 portal was identified using a 22 gauge  needle. This was opened.  A probe was placed. The TFCC was intact with a  good trampoline effect.  An Arthrowand was inserted. A shrinkage of the  ulnar carpal ligaments was performed volarly and a synovectomy performed.  The scope was introduced into the 4/5 portal. The distal radial ulnar joint  was entered palmar to the TFCC and no arthritic changes were identified.  The scope was then reintroduced into the proximal carpal row. The  lunotriquetral joint was intact.  The scapholunate ligament was then probed.  The wrist placed in dorsiflexion under traction, and a complete tear of the  scapholunate ligament was appreciated. The mid carpal joint was then  inspected. The STT joint showed no changes. The proximal capitate and hamate  showed no changes. There was no instability of the lunotriquetral joint. A  complete tear  with a drive through of a probe from the proximal carpal  portal to the mid carpal portal was easily apparent and the scope was able  to be used to visualize the distal radius. It was decided that no shrinkage  was going to be of significant benefit for him. The portals were  closed. A sterile compressive dressing and splint was applied. The patient  tolerated the procedure well and was taken to the recovery room for  observation in satisfactory condition.  He is discharged home to return to  the [facility] [place] in one week on Vicodin and Keflex for further  discussions and potential treatment.                                               Cindee Salt, M.D.    Angelique Blonder  D:  03/27/2003  T:  03/27/2003  Job:  [id number]

## 2011-05-14 NOTE — Op Note (Signed)
Brian [name], Brian [name]              ACCOUNT NO.:  [id number]   MEDICAL RECORD NO.:  [id number]          PATIENT TYPE:  AMB   LOCATION:  DAY                          FACILITY:  [facility]   PHYSICIAN:  Timothy E. Earlene Plater, M.D. DATE OF BIRTH:  [birth year]   DATE OF PROCEDURE:  05/19/2006  DATE OF DISCHARGE:                                 OPERATIVE REPORT   PREOPERATIVE DIAGNOSIS:  Internal hemorrhoids with prolapse.   POSTOPERATIVE DIAGNOSIS:  Internal hemorrhoids with prolapse.   PROCEDURE:  PPH (procedure for prolapsed hemorrhoids).   SURGEON:  Timothy E. Earlene Plater, M.D.   ANESTHESIA:  General.   [name] is otherwise healthy, perfectly fit, normal bowel function.  He  has prolapsing hemorrhoids for years, which have failed conservative  management and, after careful consideration, he wishes to proceed with a  PPH.  He was seen, identified and the permit signed.   He was taken to the operating room and placed supine.  LMA anesthesia  provided.  He was carefully placed in lithotomy and positioned.  Perineal  area was inspected, prepped and draped in the usual fashion.  A total of 20  cc of 0.25% Marcaine with epinephrine mixed 1:25 with Wydase was injected  round about the anal orifice for prolonged anesthesia.  This was massaged in  well.  The anus was dilated and the large operating anoscope inserted.  There were no other complicating factors except for mucosal prolapse  syndrome and redundant mucosa and hemorrhoids.  A pursestring suture of 2-0  Prolene was placed 4 cm proximal to the dentate line, and this was done  carefully, and it appeared good and felt good about the examining finger.  The large operating scope was removed.  The small plastic Ethicon anoscope  was inserted.  The strings were drawn through the center and, again, the  pursestring suture was checked.  Then the PTH stapling device, fully  unwound, was placed in the plastic anoscope.  The head was placed just  proximal to the 2-0 pursestring suture.  It was seated nicely.  The  pursestring suture tied.  The ends of the pursestring suture brought out  alongside the stapler.  The stapler was then positioned carefully and then  gently closed fully, locked, held for 1 minute, fired, held for 1 minute,  and removed.  The area was simply packed with gauze for 3 minutes and then  reinspected.  A tiny bit of blood was seen.  It was packed another 3 minutes  and re-observed.  One small area was oozing and that was sutured with a 4-0  Vicryl.  No other areas were problematic or  bleeding.  The wound was dry.  A packing of Gelfoam and Surgicel was placed,  and the procedure was complete.  Counts were correct.  He tolerated it well  and was removed to recovery room in good condition.   Written and verbal instructions given, including Percocet.  He will be seen  and followed in the office.      Timothy E. Earlene Plater, M.D.  Electronically Signed     TED/MEDQ  D:  05/19/2006  T:  05/19/2006  Job:  [id number]   cc:   Titus Dubin. Alwyn Ren, M.D. [facility]  [id number] [place]  [place] [address]

## 2011-07-30 ENCOUNTER — Other Ambulatory Visit: Payer: Self-pay | Admitting: Internal Medicine

## 2011-07-30 MED ORDER — DILTIAZEM HCL ER BEADS 120 MG PO CP24: 120.0000 mg | ORAL_CAPSULE | Freq: Every day | ORAL | Status: DC

## 2011-07-30 NOTE — Telephone Encounter (Signed)
RX sent to the pharmacy  

## 2011-10-01 ENCOUNTER — Ambulatory Visit (INDEPENDENT_AMBULATORY_CARE_PROVIDER_SITE_OTHER): Payer: BC Managed Care – PPO

## 2011-10-01 DIAGNOSIS — Z23 Encounter for immunization: Secondary | ICD-10-CM

## 2011-10-01 MED ORDER — TETANUS-DIPHTH-ACELL PERTUSSIS 5-2.5-18.5 LF-MCG/0.5 IM SUSP
0.5000 mL | Freq: Once | INTRAMUSCULAR | Status: AC
  Administered 2011-10-01: 0.5 mL via INTRAMUSCULAR

## 2012-02-07 ENCOUNTER — Telehealth: Payer: Self-pay | Admitting: Internal Medicine

## 2012-02-07 NOTE — Telephone Encounter (Signed)
Dr.Hopper please advise 

## 2012-02-07 NOTE — Telephone Encounter (Signed)
[facility] Triage Call Report Triage Record Num: [id number] Operator: Arline Asp Loftin Patient Name: Brian [name] Call Date & Time: 02/07/2012 10:19:24AM Patient Phone: [phone] PCP: Marga Melnick Caller Name: Dannielle Burn Relationship to Patient: Unknown Patient Gender: Male PCP Fax : [phone] Patient DOB: 1946-09-26 Practice Name: [facility] Reason for Call: Caller: Pamela/spouse [phone], PCP is Marga Melnick. Pt has yearly mission trip to [place], [place]. Receives RX for Cipro & Diflucan (from health dept). Needs to know if Dr Alwyn Ren will call in Diamox and Lomotil to take with him. States he does need a "full prescription" for Diamox, just for the first few days of trip, Lomotil to be used if needed. Generic okay. OFFICE PLEASE CALL PAMELA AT [phone] AND ADVISE IF RX FOR DIAMOX AND LOMOTIL HAS BEEN CALLED IN. [facility] AT [phone]. (WILL BE LEAVING FOR TRIP 03/01). Protocol(s) Used: Office Note Recommended Outcome per Protocol: Information Noted and Sent to Office Reason for Outcome: Caller information to office Care Advice:

## 2012-02-08 MED ORDER — ACETAZOLAMIDE 250 MG PO TABS: ORAL_TABLET | ORAL | Status: DC

## 2012-02-08 MED ORDER — DIPHENOXYLATE-ATROPINE 2.5-0.025 MG PO TABS: 1.0000 | ORAL_TABLET | ORAL | Status: AC | PRN

## 2012-02-08 NOTE — Telephone Encounter (Signed)
Addended by: Candie Echevaria L on: 02/08/2012 10:24 AM   Modules accepted: Orders

## 2012-02-08 NOTE — Telephone Encounter (Signed)
Pt wife aware. Rx sent 

## 2012-02-08 NOTE — Telephone Encounter (Signed)
Generic Diamox 250 twice a day started at least one day prior to theascent and continued for 5 days ;dispense 12. Generic Lomotil 2.5 mg ,one as needed for frank diarrhea dispense 12. Same for his wife.

## 2012-02-14 ENCOUNTER — Telehealth: Payer: Self-pay

## 2012-02-14 NOTE — Telephone Encounter (Signed)
Dr.Hopper please advise 

## 2012-02-14 NOTE — Telephone Encounter (Signed)
Msg from patient and he stated he is currently having his headaches managed by [facility] and the headaches are currently under control, but patient is unhappy with the care and would like Dr.Hopper to managed his medication. Patient can be reached at work [phone]. Please advise      KP

## 2012-02-14 NOTE — Telephone Encounter (Signed)
Discuss with patient  

## 2012-02-14 NOTE — Telephone Encounter (Signed)
I would need documentation from the [facility] clinic that  headaches are well controlled and they do not anticipate significant changes in therapy. If the headaches are variable or unstable he should continue at that clinic or seek a second opinion from another neurologic facility.

## 2012-03-08 ENCOUNTER — Telehealth: Payer: Self-pay | Admitting: Internal Medicine

## 2012-03-08 MED ORDER — DILTIAZEM HCL ER BEADS 120 MG PO CP24: 120.0000 mg | ORAL_CAPSULE | Freq: Every day | ORAL | Status: DC

## 2012-03-08 NOTE — Telephone Encounter (Signed)
Refill for:  Diltiazem ER 120Mg  Capsule  Qty 90  Last filled 12.12.2012  Take 1-capsule each day generic for Tiazac 120MG /24HR

## 2012-03-08 NOTE — Telephone Encounter (Signed)
Patient needs to schedule a CPX, med filled for 3 month supply with no refills

## 2012-03-10 NOTE — Telephone Encounter (Signed)
Called patient back & advised medication status & that he would need a physical before any more refills were done. Also advised he needed to make Appt as soon as possible to be sure we can get him in before his meds run out. He said he would call back

## 2012-03-20 ENCOUNTER — Other Ambulatory Visit: Payer: BC Managed Care – PPO

## 2012-03-20 ENCOUNTER — Encounter: Payer: Self-pay | Admitting: Internal Medicine

## 2012-03-20 ENCOUNTER — Telehealth: Payer: Self-pay | Admitting: Internal Medicine

## 2012-03-20 ENCOUNTER — Ambulatory Visit (INDEPENDENT_AMBULATORY_CARE_PROVIDER_SITE_OTHER): Payer: BC Managed Care – PPO | Admitting: Internal Medicine

## 2012-03-20 VITALS — BP 112/78 | HR 51 | Wt 149.6 lb

## 2012-03-20 DIAGNOSIS — Z8546 Personal history of malignant neoplasm of prostate: Secondary | ICD-10-CM | POA: Insufficient documentation

## 2012-03-20 DIAGNOSIS — I10 Essential (primary) hypertension: Secondary | ICD-10-CM

## 2012-03-20 DIAGNOSIS — N400 Enlarged prostate without lower urinary tract symptoms: Secondary | ICD-10-CM

## 2012-03-20 MED ORDER — DILTIAZEM HCL ER BEADS 120 MG PO CP24: 120.0000 mg | ORAL_CAPSULE | Freq: Every day | ORAL | Status: DC

## 2012-03-20 MED ORDER — VARDENAFIL HCL 20 MG PO TABS: 20.0000 mg | ORAL_TABLET | Freq: Every day | ORAL | Status: DC | PRN

## 2012-03-20 NOTE — Telephone Encounter (Signed)
Pt was seen today and states he discussed with Dr. Alwyn Ren having labs done and saw on his paperwork that orders were placed. Pt would like to know if he is supposed to schedule these labs (bmp, psa). Papers do not indicate that he needs to schedule these labs. Please advise.

## 2012-03-20 NOTE — Progress Notes (Signed)
  Subjective:    Patient ID: Brian [name], male    DOB: November 24, 1946, 67 y.o.   MRN: [id number]  HPI CHRONIC HYPERTENSION: Disease Monitoring  Blood pressure range: 103/60-132/74  Chest pain:no   Dyspnea: only @ high level CVE   Claudication:no Medication compliance: yes Medication Side Effects  Lightheadedness: no  Urinary frequency:no   Edema: no    Preventitive Healthcare:  Exercise: gym 2 X/ week  Diet Pattern: no plan  Salt Restriction: no added salt      Review of Systems He denies hematuria,pyuria, dysuria, hesitancy .His PSA has ranged from a low of 2.07 in 12/09 to high of 3.49 and 11/2010. Velocity of rise was 0.71  Over 2009 -11. FH negative for prostate disease Renal function has been consistently normal.     Objective:   Physical Exam Gen.: Healthy and well-nourished in appearance. Alert, appropriate and cooperative throughout exam. Eyes: No corneal or conjunctival inflammation noted. Pupils equal round reactive to light and accommodation. Fundal exam is benign without hemorrhages, exudate, papilledema. Neck: No deformities, masses, or tenderness noted. Thyroid SS. Lungs: Normal respiratory effort; chest expands symmetrically. Lungs are clear to auscultation without rales, wheezes, or increased work of breathing. Heart: Slow rate and regular rhythm. Normal S1 and S2. No gallop, click, or rub. No murmur. Abdomen: Bowel sounds normal; abdomen soft and nontender. No masses, organomegaly or hernias noted. Genitalia/DRE:  Small L varices. Prostate 1.5- 2 + w/o nodules.                                                     Musculoskeletal/extremities: No deformity or scoliosis noted of  the thoracic or lumbar spine. No clubbing, cyanosis, edema, or deformity noted. Tone & strength  normal.Joints normal. Nail health  good. Vascular: Carotid, radial artery, dorsalis pedis and  posterior tibial pulses are full and equal. No bruits present. Neurologic: Alert and oriented x3.  Deep tendon reflexes symmetrical but 1/2 + @ knees.          Skin: Intact without suspicious lesions or rashes. Lymph: No cervical, axillary, or inguinal lymphadenopathy present. Psych: Mood and affect are normal. Normally interactive                                                                                         Assessment & Plan:

## 2012-03-20 NOTE — Assessment & Plan Note (Addendum)
Blood pressure control is excellent. He does have a bradycardia but he exercises regularly and is asymptomatic. No change will be made in medications. BMET

## 2012-03-20 NOTE — Telephone Encounter (Signed)
Patient was suppose to go to the lab after his visit today. Patient to come back in for labs today if possible

## 2012-03-20 NOTE — Patient Instructions (Signed)
Blood Pressure Goal  Ideally is an AVERAGE < 135/85. This AVERAGE should be calculated from @ least 5-7 BP readings taken @ different times of day on different days of week. You should not respond to isolated BP readings , but rather the AVERAGE for that week  

## 2012-03-21 LAB — PSA: PSA: 4.03 ng/mL — ABNORMAL HIGH (ref 0.10–4.00)

## 2012-03-21 LAB — BASIC METABOLIC PANEL
BUN: 17 mg/dL (ref 6–23)
GFR: 96.55 mL/min (ref >60.00)
Potassium: 3.8 mEq/L (ref 3.5–5.1)
Sodium: 139 mEq/L (ref 135–145)

## 2012-06-14 DIAGNOSIS — C61 Malignant neoplasm of prostate: Secondary | ICD-10-CM | POA: Insufficient documentation

## 2012-06-14 HISTORY — PX: BIOPSY PROSTATE: PRO28

## 2012-06-14 HISTORY — DX: Malignant neoplasm of prostate: C61

## 2012-07-03 ENCOUNTER — Encounter: Payer: Self-pay | Admitting: Internal Medicine

## 2012-07-21 ENCOUNTER — Encounter: Payer: Self-pay | Admitting: Radiation Oncology

## 2012-07-21 ENCOUNTER — Encounter: Payer: Self-pay | Admitting: *Deleted

## 2012-07-21 NOTE — Progress Notes (Signed)
Married  2009 PSA 2.07 11/12 PSA 3.49 03/20/12 PSA 4.03  Brother w/elevated PSA, benign biopsy

## 2012-07-24 ENCOUNTER — Ambulatory Visit
Admission: RE | Admit: 2012-07-24 | Discharge: 2012-07-24 | Disposition: A | Discharge status: Home / Self Care | Payer: BC Managed Care – PPO | Source: Ambulatory Visit | Attending: Radiation Oncology | Admitting: Radiation Oncology

## 2012-07-24 ENCOUNTER — Encounter: Payer: Self-pay | Admitting: Radiation Oncology

## 2012-07-24 VITALS — BP 125/81 | HR 55 | Temp 98.1°F | Wt 150.4 lb

## 2012-07-24 DIAGNOSIS — C61 Malignant neoplasm of prostate: Secondary | ICD-10-CM

## 2012-07-24 DIAGNOSIS — Z79899 Other long term (current) drug therapy: Secondary | ICD-10-CM | POA: Insufficient documentation

## 2012-07-24 DIAGNOSIS — I10 Essential (primary) hypertension: Secondary | ICD-10-CM | POA: Insufficient documentation

## 2012-07-24 NOTE — Progress Notes (Signed)
Please see the Nurse Progress Note in the MD Initial Consult Encounter for this patient. 

## 2012-07-24 NOTE — Progress Notes (Signed)
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 gentleman.  He was noted to have an elevated PSA of 4.03 by his primary care physician, Dr. Alwyn [name].  Accordingly, he was referred for evaluation in urology by Dr. Annabell [name] on 05/03/2012,  digital rectal examination was performed at that time revealing induration in the right base medially.  The patient proceeded to transrectal ultrasound with 12 biopsies of the prostate on 06/14/2012.  The prostate volume measured 33.84 cc.  Out of 12 core biopsies, 3 were positive.  The maximum Gleason score was 3+3, and this was seen in 5% of the left lateral base, 10% of the left lateral apex, and 5% of the left apex..  The patient reviewed the biopsy results with his urologist and he has kindly been referred today for discussion of potential radiation treatment options.  PREVIOUS RADIATION THERAPY: No  PAST MEDICAL HISTORY:  has a past medical history of Hypertension; Prostate cancer (06/14/12); Arthritis; Asthma; and Migraine headache.    PAST SURGICAL HISTORY: Past Surgical History  Procedure Date  . Vasectomy   . Tonsillectomy 414    66 years old  . Hemorrhoid surgery   . Biopsy prostate 06/14/2012  . Colonoscopy     polyps removed    FAMILY HISTORY: family history includes Bone cancer in his paternal grandfather; Diabetes in his maternal grandmother and mother; Heart disease in his paternal grandmother; Heart failure in his father; Hypertension in his father and maternal grandmother; and Stroke in his maternal grandfather.  SOCIAL  HISTORY:  reports that he has never smoked. He does not have any smokeless tobacco history on file. He reports that he does not drink alcohol or use illicit drugs.  ALLERGIES: Review of patient's allergies indicates no known allergies.  MEDICATIONS:  Current Outpatient Prescriptions  Medication Sig Dispense Refill  . acetaZOLAMIDE (DIAMOX) 250 MG tablet Take 1 tab twice a day started at least one day prior to theascent and continued for 5 days  12 tablet  0  . diltiazem (TIAZAC) 120 MG 24 hr capsule Take 1 capsule (120 mg total) by mouth daily.  90 capsule  3  . fish oil-omega-3 fatty acids 1000 MG capsule Take 2 g by mouth daily. Takes 500 mg daily      . glucosamine-chondroitin 500-400 MG tablet Take 1 tablet by mouth 2 (two) times daily.      . Multiple Vitamins-Minerals (MACUVITE PO) Take by mouth.      . SUMAtriptan (IMITREX) 100 MG tablet Take 100 mg by mouth every 2 (two) hours as needed.      Marland Kitchen tiZANidine (ZANAFLEX) 4 MG tablet Take 4 mg by mouth every 6 (six) hours as needed.      . topiramate (TOPAMAX) 200 MG tablet Take 200 mg by mouth daily.       . vardenafil (LEVITRA) 20 MG tablet Take 20 mg by mouth daily as needed. Takes 5 mg daily (cuts tablet in 1/4)      . vitamin C (ASCORBIC ACID) 500 MG tablet Take 500 mg by mouth 2 (two) times  daily.      Marland Kitchen DISCONTD: vardenafil (LEVITRA) 20 MG tablet Take 1 tablet (20 mg total) by mouth daily as needed.  10 tablet  3    REVIEW OF SYSTEMS:  A 15 point review of systems is documented in the electronic medical record. This was obtained by the nursing staff. However, I reviewed this with the patient to discuss relevant findings and make appropriate changes.  A comprehensive review of systems was negative.  the patient filled out a urinary function questionnaire providing overall score of 8 suggesting moderate urinary outflow chart of symptoms. He also he filled out in erectile function questionnaire indicating that he is almost always capable of  achieving an erection capable of completion of intercourse   PHYSICAL EXAM:  weight is 150 lb 6.4 oz (68.221 kg). His temperature is 98.1 F (36.7 C). His blood pressure is 125/81 and his pulse is 55.   The patient is in no acute distress today. Is alert and oriented. Please note the digital rectal exam findings described above. He was originally noted to have a nodule the prostate on the contralateral side from his positive biopsies.  LABORATORY DATA:  Lab Results  Component Value Date   WBC 5.7 12/01/2010   HGB 14.6 12/01/2010   HCT 42.3 12/01/2010   MCV 92.4 12/01/2010   PLT 183.0 12/01/2010   Lab Results  Component Value Date   NA 139 03/20/2012   K 3.8 03/20/2012   CL 108 03/20/2012   CO2 25 03/20/2012   Lab Results  Component Value Date   ALT 22 12/01/2010   AST 21 12/01/2010   ALKPHOS 68 12/01/2010   BILITOT 1.0 12/01/2010     RADIOGRAPHY: No results found.    IMPRESSION:  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 He falls into a favorable risk group and he is eligible for a variety of potential treatment options. His options include watchful waiting, radical prostatectomy, external beam radiation treatment, and prostate seed implant.   PLAN:Today I reviewed the findings and workup thus far.  We discussed the natural history of prostate cancer.  We reviewed the the implications of T-stage, Gleason's Score, and PSA on decision-making and outcomes in prostate cancer.  We discussed radiation treatment in the management of prostate cancer with regard to the logistics and delivery of external beam radiation treatment as well as the logistics and delivery of prostate brachytherapy.  We compared and contrasted each of these approaches and also compared these against prostatectomy.  The patient expressed significant interest in prostate brachytherapy.  I filled out a patient counseling form for him with  relevant treatment diagrams and we retained a copy for our records.   The patient would like to proceed with prostate brachytherapy.  However, he would like to delay his seed implant until early November after his daughter's upcoming wedding.  I will share my findings with Dr. Annabell [name] and move forward with scheduling the procedure in the near future.     I enjoyed meeting with him today, and will look forward to participating in the care of this very nice gentleman.   I spent 60 minutes minutes face to face with the patient and more than 50% of that time was spent in counseling and/or coordination of care.   ------------------------------------------------  Artist Pais. Kathrynn Running, M.D.

## 2012-07-24 NOTE — Progress Notes (Signed)
New consultation for prostate cancer. Gleason 6 Prostate volume 33 cc's. Leaning towards seed implant. IPSS 8

## 2012-09-04 ENCOUNTER — Telehealth: Payer: Self-pay | Admitting: *Deleted

## 2012-09-04 NOTE — Telephone Encounter (Signed)
CALLED PATIENT TO ASK QUESTION, LVM FOR A RETURN CALL 

## 2012-09-05 ENCOUNTER — Telehealth: Payer: Self-pay | Admitting: Internal Medicine

## 2012-09-05 NOTE — Telephone Encounter (Signed)
Left message on voicemail for patient to return call when available, unable to release records through [facility]

## 2012-09-05 NOTE — Telephone Encounter (Signed)
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

## 2012-09-06 ENCOUNTER — Telehealth: Payer: Self-pay | Admitting: *Deleted

## 2012-09-06 NOTE — Telephone Encounter (Signed)
Spoke with patient's wife, verified fax. Papers sent

## 2012-09-06 NOTE — Telephone Encounter (Signed)
Called patient to inform of pre-seed appt. For 09-08-12 at 1:00 pm, spoke with patient's wife and they are aware of this appt.

## 2012-09-07 ENCOUNTER — Other Ambulatory Visit: Payer: Self-pay | Admitting: Urology

## 2012-09-08 ENCOUNTER — Ambulatory Visit (HOSPITAL_BASED_OUTPATIENT_CLINIC_OR_DEPARTMENT_OTHER)
Admission: RE | Admit: 2012-09-08 | Discharge: 2012-09-08 | Disposition: A | Discharge status: Home / Self Care | Payer: BC Managed Care – PPO | Source: Ambulatory Visit | Attending: Urology | Admitting: Urology

## 2012-09-08 ENCOUNTER — Other Ambulatory Visit: Payer: Self-pay

## 2012-09-08 ENCOUNTER — Ambulatory Visit
Admission: RE | Admit: 2012-09-08 | Discharge: 2012-09-08 | Disposition: A | Discharge status: Home / Self Care | Payer: BC Managed Care – PPO | Source: Ambulatory Visit | Attending: Radiation Oncology | Admitting: Radiation Oncology

## 2012-09-08 ENCOUNTER — Encounter (HOSPITAL_BASED_OUTPATIENT_CLINIC_OR_DEPARTMENT_OTHER)
Admission: RE | Admit: 2012-09-08 | Discharge: 2012-09-08 | Disposition: A | Discharge status: Home / Self Care | Payer: BC Managed Care – PPO | Source: Ambulatory Visit | Attending: Urology | Admitting: Urology

## 2012-09-08 ENCOUNTER — Encounter: Payer: Self-pay | Admitting: Radiation Oncology

## 2012-09-08 DIAGNOSIS — I10 Essential (primary) hypertension: Secondary | ICD-10-CM

## 2012-09-08 DIAGNOSIS — C61 Malignant neoplasm of prostate: Secondary | ICD-10-CM

## 2012-09-08 DIAGNOSIS — N400 Enlarged prostate without lower urinary tract symptoms: Secondary | ICD-10-CM

## 2012-09-08 NOTE — Assessment & Plan Note (Signed)
Planning for prostate seed implant

## 2012-09-08 NOTE — Progress Notes (Signed)
  Radiation Oncology         [phone] ________________________________  Name: ULYSSE SIEMEN MRN: [id number]  Date: 09/08/2012  DOB: 12-13-1946  SIMULATION AND TREATMENT PLANNING NOTE PUBIC ARCH STUDY  [id number] Alwyn Ren, MD  Anner Crete, MD  DIAGNOSIS: 66 year old gentleman with stage T1c vs. T2a adenocarcinoma prostate with Gleason score 3+3 PSA of 4.03 - Favorable Risk Planning for prostate seed implant   COMPLEX SIMULATION:  The patient presented today for evaluation for possible prostate seed implant. He was brought to the radiation planning suite and placed supine on the CT couch. A 3-dimensional image study set was obtained in upload to the planning computer. There, on each axial slice, I contoured the prostate gland. Then, using three-dimensional radiation planning tools I reconstructed the prostate in view of the structures from the transperineal needle pathway to assess for possible pubic arch interference. In doing so, I did not appreciate any pubic arch interference. Also, the patient's prostate volume was estimated based on the drawn structure. The volume was 38 cc.  Given the pubic arch appearance and prostate volume, patient remains a good candidate to proceed with prostate seed implant. Today, he freely provided informed written consent to proceed.    PLAN: The patient will undergo prostate seed implant to 145 Gy with I-125 seeds   ________________________________  Artist Pais. Kathrynn Running, M.D.

## 2012-10-18 ENCOUNTER — Telehealth: Payer: Self-pay | Admitting: *Deleted

## 2012-10-18 NOTE — Telephone Encounter (Signed)
Called patient to remind of appt. For 10-19-12, lvm for a return call 

## 2012-10-19 LAB — COMPREHENSIVE METABOLIC PANEL
ALT: 12 U/L (ref 0–53)
Albumin: 4 g/dL (ref 3.5–5.2)
Alkaline Phosphatase: 71 U/L (ref 39–117)
BUN: 12 mg/dL (ref 6–23)
Chloride: 110 mEq/L (ref 96–112)
GFR calc Af Amer: >90 mL/min (ref >90)
Glucose, Bld: 85 mg/dL (ref 70–99)
Potassium: 3.9 mEq/L (ref 3.5–5.1)
Sodium: 142 mEq/L (ref 135–145)
Total Bilirubin: 0.6 mg/dL (ref 0.3–1.2)

## 2012-10-19 LAB — CBC
HCT: 39.5 % (ref 39.0–52.0)
Hemoglobin: 13.5 g/dL (ref 13.0–17.0)
WBC: 4.6 10*3/uL (ref 4.0–10.5)

## 2012-10-19 LAB — PROTIME-INR: Prothrombin Time: 13.8 seconds (ref 11.6–15.2)

## 2012-10-19 LAB — APTT: aPTT: 30 seconds (ref 24–37)

## 2012-10-23 ENCOUNTER — Encounter (HOSPITAL_BASED_OUTPATIENT_CLINIC_OR_DEPARTMENT_OTHER): Payer: Self-pay | Admitting: *Deleted

## 2012-10-23 NOTE — Progress Notes (Signed)
NPO AFTER MN. ARRIVES AT [id number]. CURRENT LAB WORK, EKG  AND CXR IN EPIC AND CHART. WILL TAKE TIAZ AM OF SURG W/ SIP OF WATER AND DO FLEET ENEMA.

## 2012-10-25 ENCOUNTER — Telehealth: Payer: Self-pay | Admitting: *Deleted

## 2012-10-25 NOTE — Telephone Encounter (Signed)
CALLED PATIENT TO REMIND OF PROCEDURE FOR 10-26-12, LVM FOR A RETURN CALL

## 2012-10-25 NOTE — H&P (Signed)
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 Prostate Cancer 185 3. Prostate Hard Area Or Nodule 600.10 4. PSA,Elevated 790.93  History of Present Illness  [name] returns today to discuss his prostate biopsy results.  He has a very low risk prostate cancer with 3 cores in the left prostate with 5-10% Gleason 6 disease.   He had induration at the right base but that area only had HGPIN.  His prostate volume is 33cc.  An IPSS today is 9 and  a SHIM is 19.   A Kattan nomogram predicts a 22% chance of indolent disease, 85% chance of organ confined disease, a 17% chance of ECE, a 1% chance of SVI and a 1.3% chance of LNI.   Past Medical History Problems  1. History of  Arthritis V13.4 2. History of  Asthma 493.90 3. History of  Migraine Headache 346.90  Surgical History Problems  1. History of  Surgery Of Male Genitalia Vasectomy V25.2 2. History of  Tonsillectomy  Current Meds 1. Cosamin DS TABS; Therapy: (Recorded:10Jul2013) to 2. Diltiazem HCl ER Coated Beads 120 MG Oral Capsule Extended Release 24 Hour; Therapy:  25Mar2013 to 3. Fish Oil CAPS; Therapy: (Recorded:08May2013) to 4. Levitra 20 MG Oral Tablet; Therapy: (Recorded:08May2013) to 5. Macuvite TABS; Therapy: (Recorded:08May2013) to 6. SUMAtriptan Succinate 100 MG Oral Tablet; Therapy: 15Oct2012 to 7. TiZANidine HCl 4 MG Oral Tablet; Therapy: 20May2013 to 8. Topiramate 200 MG Oral Tablet; Therapy: 18Feb2013 to 9. Vivotif Berna Vaccine CPDR; Therapy: 10Jan2013 to  Allergies Medication  1. No Known Drug Allergies  Family History Problems  1. Paternal grandfather's history of  Bone Cancer 2. Paternal grandfather's history of  Death In The Family Father Died age 50-Complications due to surgery 3. Maternal history of  Diabetes Mellitus V18.0 4. Maternal grandmother's history of  Diabetes Mellitus V18.0 5. Paternal grandfather's history of  Family Health Status Number Of Children 2 daughters 6. Paternal  history of  Heart Disease V17.49 7. Paternal grandmother's history of  Heart Disease V17.49  Social History Problems  1. Marital History - Currently Married 2. Occupation: Self employed Denied  3. History of  Alcohol Use 4. History of  Tobacco Use 305.1  Results/Data  The following images/tracing/specimen were independently visualized:  I calculated a Kattan nomogram as noted above.  The following clinical lab reports were reviewed:  Path report reviewed.  IPSS: The IPSS today is 9   SHIM: The SHIM score today is 19 .    Assessment Assessed  1. Prostate Cancer 185   He has very low risk Gleason 6, T2a Nx Mx prostate cancer.   Plan Prostate Cancer (185)  1. Radiation Oncology Referral Referral  Referral  Requested for: 10Jul2013   I discussed his options including active surveillance, radical prostatectomy, EXRT, seeds, cryo and HIFU. With his disease he is a good candidate for most of the options but I feel that AS, RALP and Seeds are his best options. He is most interested in a seed implant and I will set him up to see Dr. Margaretmary Bayley for further consideration. He will let me know how he would like to proceed.  I have given him his path report and copies of my handout on prostate cancer and post treatment ED treatment options.   Discussion/Summary  The patient was counseled about the natural history of prostate cancer and the standard treatment options that are available for prostate cancer. It was explained to him how his age and life expectancy, clinical  stage, Gleason score, and PSA affect his prognosis, the decision to proceed with additional staging studies, as well as how that information influences recommended treatment strategies. We discussed the roles for active surveillance, radiation therapy, surgical therapy, androgen deprivation, as well as ablative therapy options for the treatment of prostate cancer as appropriate to his individual cancer situation. We discussed  the risks and benefits of these options with regard to their impact on cancer control and also in terms of potential adverse events, complications, and impact on quiality of life particularly related to urinary, bowel, and sexual function. The patient was encouraged to ask questions throughout the discussion today and all questions were answered to his stated satisfaction. In addition, the patient was provided with and/or directed to appropriate resources and literature for further education about prostate cancer and treatment options.   We discussed surgical therapy for prostate cancer including the different available surgical approaches. We discussed, in detail, the risks and expectations of surgery with regard to cancer control, urinary control, and erectile function as well as the expected postoperative recovery process. Additional risks of surgery including but not limited to bleeding, infection, hernia formation, nerve damage, lymphocele formation, bowel/rectal injury potentially necessitating colostomy, damage to the urinary tract resulting in urine leakage, urethral stricture, and the cardiopulmonary risks such as myocardial infarction, stroke, death, venothromboembolism, etc. were explained. The risk of open surgical conversion for robotic/laparoscopic prostatectomy was also discussed.  A total of 50 minutes were spent in the overall care of the patient today with 50 minutes in direct face to face consultation.   CC: Dr. Marga Melnick.

## 2012-10-26 ENCOUNTER — Encounter (HOSPITAL_BASED_OUTPATIENT_CLINIC_OR_DEPARTMENT_OTHER): Payer: Self-pay | Admitting: Anesthesiology

## 2012-10-26 ENCOUNTER — Encounter (HOSPITAL_BASED_OUTPATIENT_CLINIC_OR_DEPARTMENT_OTHER): Payer: Self-pay | Admitting: *Deleted

## 2012-10-26 ENCOUNTER — Ambulatory Visit (HOSPITAL_BASED_OUTPATIENT_CLINIC_OR_DEPARTMENT_OTHER): Payer: BC Managed Care – PPO | Admitting: Anesthesiology

## 2012-10-26 ENCOUNTER — Ambulatory Visit (HOSPITAL_BASED_OUTPATIENT_CLINIC_OR_DEPARTMENT_OTHER)
Admission: RE | Admit: 2012-10-26 | Discharge: 2012-10-26 | Disposition: A | Discharge status: Home / Self Care | Payer: BC Managed Care – PPO | Source: Ambulatory Visit | Attending: Urology | Admitting: Urology

## 2012-10-26 ENCOUNTER — Ambulatory Visit (HOSPITAL_COMMUNITY): Payer: BC Managed Care – PPO

## 2012-10-26 ENCOUNTER — Encounter (HOSPITAL_BASED_OUTPATIENT_CLINIC_OR_DEPARTMENT_OTHER)
Admission: RE | Disposition: A | Discharge status: Home / Self Care | Payer: Self-pay | Source: Ambulatory Visit | Attending: Urology

## 2012-10-26 DIAGNOSIS — Z79899 Other long term (current) drug therapy: Secondary | ICD-10-CM | POA: Insufficient documentation

## 2012-10-26 DIAGNOSIS — C61 Malignant neoplasm of prostate: Secondary | ICD-10-CM | POA: Insufficient documentation

## 2012-10-26 DIAGNOSIS — N529 Male erectile dysfunction, unspecified: Secondary | ICD-10-CM | POA: Insufficient documentation

## 2012-10-26 HISTORY — DX: Allergy to other foods: Z91.018

## 2012-10-26 HISTORY — PX: RADIOACTIVE SEED IMPLANT: SHX5150

## 2012-10-26 HISTORY — DX: Nocturia: R35.1

## 2012-10-26 HISTORY — PX: CYSTOSCOPY: SHX5120

## 2012-10-26 SURGERY — INSERTION, RADIATION SOURCE, PROSTATE
Anesthesia: General | Site: Prostate | Wound class: Clean Contaminated

## 2012-10-26 MED ORDER — IOHEXOL 350 MG/ML SOLN
INTRAVENOUS | Status: DC | PRN
  Administered 2012-10-26: 3 mL

## 2012-10-26 MED ORDER — HYDROCODONE-ACETAMINOPHEN 5-325 MG PO TABS
1.0000 | ORAL_TABLET | Freq: Four times a day (QID) | ORAL | Status: AC | PRN
  Administered 2012-10-26: 1 via ORAL
  Filled 2012-10-26: qty 1

## 2012-10-26 MED ORDER — MEPERIDINE HCL 25 MG/ML IJ SOLN
6.2500 mg | INTRAMUSCULAR | Status: DC | PRN
  Filled 2012-10-26: qty 1

## 2012-10-26 MED ORDER — FLEET ENEMA 7-19 GM/118ML RE ENEM
1.0000 | ENEMA | Freq: Once | RECTAL | Status: DC
  Filled 2012-10-26: qty 1

## 2012-10-26 MED ORDER — PROMETHAZINE HCL 25 MG/ML IJ SOLN
6.2500 mg | INTRAMUSCULAR | Status: DC | PRN
  Filled 2012-10-26: qty 1

## 2012-10-26 MED ORDER — LACTATED RINGERS IV SOLN
INTRAVENOUS | Status: DC
  Filled 2012-10-26: qty 1000

## 2012-10-26 MED ORDER — FENTANYL CITRATE 0.05 MG/ML IJ SOLN
25.0000 ug | INTRAMUSCULAR | Status: DC | PRN
  Filled 2012-10-26: qty 1

## 2012-10-26 MED ORDER — ACETAMINOPHEN 325 MG PO TABS
650.0000 mg | ORAL_TABLET | ORAL | Status: DC | PRN
  Filled 2012-10-26: qty 2

## 2012-10-26 MED ORDER — FENTANYL CITRATE 0.05 MG/ML IJ SOLN
INTRAMUSCULAR | Status: DC | PRN
  Administered 2012-10-26 (×3): 25 ug via INTRAVENOUS
  Administered 2012-10-26: 50 ug via INTRAVENOUS
  Administered 2012-10-26: 25 ug via INTRAVENOUS

## 2012-10-26 MED ORDER — GLYCOPYRROLATE 0.2 MG/ML IJ SOLN
INTRAMUSCULAR | Status: DC | PRN
  Administered 2012-10-26: 0.2 mg via INTRAVENOUS

## 2012-10-26 MED ORDER — LACTATED RINGERS IV SOLN
INTRAVENOUS | Status: DC
  Administered 2012-10-26 (×3): via INTRAVENOUS
  Filled 2012-10-26: qty 1000

## 2012-10-26 MED ORDER — OXYCODONE HCL 5 MG PO TABS
5.0000 mg | ORAL_TABLET | ORAL | Status: DC | PRN
  Filled 2012-10-26: qty 2

## 2012-10-26 MED ORDER — CIPROFLOXACIN IN D5W 400 MG/200ML IV SOLN
400.0000 mg | INTRAVENOUS | Status: AC
  Administered 2012-10-26: 400 mg via INTRAVENOUS
  Filled 2012-10-26: qty 200

## 2012-10-26 MED ORDER — PROPOFOL 10 MG/ML IV BOLUS
INTRAVENOUS | Status: DC | PRN
  Administered 2012-10-26: 220 mg via INTRAVENOUS

## 2012-10-26 MED ORDER — SODIUM CHLORIDE 0.9 % IJ SOLN
3.0000 mL | Freq: Two times a day (BID) | INTRAMUSCULAR | Status: DC
  Filled 2012-10-26: qty 3

## 2012-10-26 MED ORDER — DEXAMETHASONE SODIUM PHOSPHATE 4 MG/ML IJ SOLN
INTRAMUSCULAR | Status: DC | PRN
  Administered 2012-10-26: 10 mg via INTRAVENOUS

## 2012-10-26 MED ORDER — CIPROFLOXACIN HCL 500 MG PO TABS: 500.0000 mg | ORAL_TABLET | Freq: Two times a day (BID) | ORAL | Status: DC

## 2012-10-26 MED ORDER — LIDOCAINE HCL (CARDIAC) 20 MG/ML IV SOLN
INTRAVENOUS | Status: DC | PRN
  Administered 2012-10-26: 85 mg via INTRAVENOUS

## 2012-10-26 MED ORDER — ONDANSETRON HCL 4 MG/2ML IJ SOLN
INTRAMUSCULAR | Status: DC | PRN
  Administered 2012-10-26: 4 mg via INTRAVENOUS

## 2012-10-26 MED ORDER — HYDROCODONE-ACETAMINOPHEN 5-325 MG PO TABS
1.0000 | ORAL_TABLET | Freq: Four times a day (QID) | ORAL | Status: DC | PRN
Start: 2012-10-26 — End: 2012-12-04

## 2012-10-26 MED ORDER — ACETAMINOPHEN 650 MG RE SUPP
650.0000 mg | RECTAL | Status: DC | PRN
  Filled 2012-10-26: qty 1

## 2012-10-26 MED ORDER — SODIUM CHLORIDE 0.9 % IJ SOLN
3.0000 mL | INTRAMUSCULAR | Status: DC | PRN
  Filled 2012-10-26: qty 3

## 2012-10-26 MED ORDER — SODIUM CHLORIDE 0.9 % IV SOLN
250.0000 mL | INTRAVENOUS | Status: DC | PRN
  Filled 2012-10-26: qty 250

## 2012-10-26 MED ORDER — ONDANSETRON HCL 4 MG/2ML IJ SOLN
4.0000 mg | Freq: Four times a day (QID) | INTRAMUSCULAR | Status: DC | PRN
  Filled 2012-10-26: qty 2

## 2012-10-26 SURGICAL SUPPLY — 23 items
BAG URINE DRAINAGE (UROLOGICAL SUPPLIES) ×3 IMPLANT
CATH FOLEY 2WAY SLVR  5CC 16FR (CATHETERS) ×2
CATH FOLEY 2WAY SLVR 5CC 16FR (CATHETERS) ×4 IMPLANT
CATH ROBINSON RED A/P 20FR (CATHETERS) ×3 IMPLANT
CLOTH BEACON ORANGE TIMEOUT ST (SAFETY) ×3 IMPLANT
COVER MAYO STAND STRL (DRAPES) ×3 IMPLANT
COVER TABLE BACK 60X90 (DRAPES) ×3 IMPLANT
DRSG TEGADERM 4X4.75 (GAUZE/BANDAGES/DRESSINGS) ×3 IMPLANT
DRSG TEGADERM 8X12 (GAUZE/BANDAGES/DRESSINGS) ×3 IMPLANT
GLOVE BIO SURGEON STRL SZ7.5 (GLOVE) ×12 IMPLANT
GLOVE BIOGEL M STRL SZ7.5 (GLOVE) ×6 IMPLANT
GLOVE ECLIPSE 8.0 STRL XLNG CF (GLOVE) IMPLANT
GLOVE SURG SS PI 8.0 STRL IVOR (GLOVE) ×6 IMPLANT
GOWN STRL REIN XL XLG (GOWN DISPOSABLE) ×6 IMPLANT
GOWN XL W/COTTON TOWEL STD (GOWNS) ×3 IMPLANT
HOLDER FOLEY CATH W/STRAP (MISCELLANEOUS) ×3 IMPLANT
IV WATER IRR. 1000ML (IV SOLUTION) ×3 IMPLANT
PACK CYSTOSCOPY (CUSTOM PROCEDURE TRAY) ×3 IMPLANT
Radioactive seeds implanted to prostate ×237 IMPLANT
SYRINGE 10CC LL (SYRINGE) ×3 IMPLANT
UNDERPAD 30X30 INCONTINENT (UNDERPADS AND DIAPERS) ×9 IMPLANT
WATER STERILE IRR 3000ML UROMA (IV SOLUTION) ×3 IMPLANT
WATER STERILE IRR 500ML POUR (IV SOLUTION) ×3 IMPLANT

## 2012-10-26 NOTE — Anesthesia Preprocedure Evaluation (Signed)
Anesthesia Evaluation  Patient identified by MRN, date of birth, ID band Patient awake    Reviewed: Allergy & Precautions, H&P , NPO status , Patient's Chart, lab work & pertinent test results  Airway Mallampati: II TM Distance: >3 FB Neck ROM: full    Dental No notable dental hx.    Pulmonary neg pulmonary ROS,  breath sounds clear to auscultation  Pulmonary exam normal       Cardiovascular Exercise Tolerance: Good hypertension, Pt. on medications negative cardio ROS  Rhythm:regular Rate:Normal     Neuro/Psych negative neurological ROS  negative psych ROS   GI/Hepatic negative GI ROS, Neg liver ROS,   Endo/Other  negative endocrine ROS  Renal/GU negative Renal ROS  negative genitourinary   Musculoskeletal   Abdominal   Peds  Hematology negative hematology ROS (+)   Anesthesia Other Findings   Reproductive/Obstetrics negative OB ROS                           Anesthesia Physical Anesthesia Plan  ASA: II  Anesthesia Plan: General LMA   Post-op Pain Management:    Induction:   Airway Management Planned:   Additional Equipment:   Intra-op Plan:   Post-operative Plan:   Informed Consent: I have reviewed the patients History and Physical, chart, labs and discussed the procedure including the risks, benefits and alternatives for the proposed anesthesia with the patient or authorized representative who has indicated his/her understanding and acceptance.   Dental Advisory Given  Plan Discussed with: CRNA  Anesthesia Plan Comments:         Anesthesia Quick Evaluation

## 2012-10-26 NOTE — Interval H&P Note (Signed)
History and Physical Interval Note:  10/26/2012 7:21 AM  Brian [name]  has presented today for surgery, with the diagnosis of PROSTATE CANCER  The various methods of treatment have been discussed with the patient and family. After consideration of risks, benefits and other options for treatment, the patient has consented to  Procedure(s) (LRB) with comments: RADIOACTIVE SEED IMPLANT (N/A) - RAD TECH OK PER ANNE DR PORTABLE  as a surgical intervention .  The patient's history has been reviewed, patient examined, no change in status, stable for surgery.  I have reviewed the patient's chart and labs.  Questions were answered to the patient's satisfaction.     Brian [name] J  He has elected to proceed with brachytherapy.  He has no complaints today.  PE: Gen WD, WN in NAD Lungs: CTA CV: RRR

## 2012-10-26 NOTE — Anesthesia Postprocedure Evaluation (Signed)
  Anesthesia Post-op Note  Patient: Brian [name]  Procedure(s) Performed: Procedure(s) (LRB): RADIOACTIVE SEED IMPLANT (N/A) CYSTOSCOPY FLEXIBLE (N/A)  Patient Location: PACU  Anesthesia Type: General  Level of Consciousness: awake and alert   Airway and Oxygen Therapy: Patient Spontanous Breathing  Post-op Pain: mild  Post-op Assessment: Post-op Vital signs reviewed, Patient's Cardiovascular Status Stable, Respiratory Function Stable, Patent Airway and No signs of Nausea or vomiting  Post-op Vital Signs: stable  Complications: No apparent anesthesia complications

## 2012-10-26 NOTE — Anesthesia Procedure Notes (Signed)
Procedure Name: LMA Insertion Date/Time: 10/26/2012 7:53 AM Performed by: Fran Lowes Pre-anesthesia Checklist: Patient identified, Emergency Drugs available, Suction available and Patient being monitored Patient Re-evaluated:Patient Re-evaluated prior to inductionOxygen Delivery Method: Circle System Utilized Preoxygenation: Pre-oxygenation with 100% oxygen Intubation Type: IV induction Ventilation: Mask ventilation without difficulty LMA: LMA inserted LMA Size: 4.0 Number of attempts: 1 Airway Equipment and Method: bite block Placement Confirmation: positive ETCO2 Tube secured with: Tape Dental Injury: Teeth and Oropharynx as per pre-operative assessment

## 2012-10-26 NOTE — Brief Op Note (Signed)
10/26/2012  9:28 AM  PATIENT:  [name]  66 y.o. male  PRE-OPERATIVE DIAGNOSIS:  PROSTATE CANCER  POST-OPERATIVE DIAGNOSIS:  PROSTATE CANCER  PROCEDURE:  Procedure(s) (LRB) with comments: RADIOACTIVE SEED IMPLANT (N/A) CYSTOSCOPY FLEXIBLE (N/A)  SURGEON:  Surgeon(s) and Role:    * Anner Crete, MD - Primary    * Oneita Hurt, MD - Assisting  PHYSICIAN ASSISTANT:   ASSISTANTS: none   ANESTHESIA:   general  EBL:  Total I/O In: 1000 [I.V.:1000] Out: -   BLOOD ADMINISTERED:none  DRAINS: Urinary Catheter (Foley)   LOCAL MEDICATIONS USED:  NONE  SPECIMEN:  Source of Specimen:  iodine seeds x 4  DISPOSITION OF SPECIMEN:  to radiation oncology for disposal  COUNTS:  YES  TOURNIQUET:  * No tourniquets in log *  DICTATION: .Other Dictation: Dictation Number [id number]  PLAN OF CARE: Discharge to home after PACU  PATIENT DISPOSITION:  PACU - hemodynamically stable.   Delay start of Pharmacological VTE agent (>24hrs) due to surgical blood loss or risk of bleeding: not applicable

## 2012-10-26 NOTE — Transfer of Care (Signed)
Immediate Anesthesia Transfer of Care Note  Patient: Brian [name]  Procedure(s) Performed: Procedure(s) (LRB): RADIOACTIVE SEED IMPLANT (N/A) CYSTOSCOPY FLEXIBLE (N/A)  Patient Location: Patient transported to PACU with oxygen via face mask at 4 Liters / Min  Anesthesia Type: General  Level of Consciousness: awake and alert   Airway & Oxygen Therapy: Patient Spontanous Breathing and Patient connected to face mask oxygen  Post-op Assessment: Report given to PACU RN and Post -op Vital signs reviewed and stable  Post vital signs: Reviewed and stable  Dentition: Teeth and oropharynx remain in pre-op condition  Complications: No apparent anesthesia complications

## 2012-10-27 ENCOUNTER — Encounter (HOSPITAL_BASED_OUTPATIENT_CLINIC_OR_DEPARTMENT_OTHER): Payer: Self-pay | Admitting: Urology

## 2012-10-27 NOTE — Op Note (Signed)
NAMEVENNIE, Brian NO.:  [id number]  MEDICAL RECORD NO.:  [id number]  LOCATION:                               FACILITY:  [facility]  PHYSICIAN:  Excell Seltzer. Annabell Howells, M.D.    DATE OF BIRTH:  Jan 16, 1946  DATE OF PROCEDURE:  10/26/2012 DATE OF DISCHARGE:                              OPERATIVE REPORT   PROCEDURE:  Prostate brachytherapy and cystoscopy with removal of retained seeds.  PREOPERATIVE DIAGNOSIS:  T1c Gleason 6 adenocarcinoma of the prostate.  POSTOPERATIVE DIAGNOSIS:  T1c Gleason 6 adenocarcinoma of the prostate.  SURGEON:  Excell Seltzer. Annabell Howells, M.D.  RADIATION ONCOLOGIST:  Oneita Hurt, M.D.  ANESTHESIA:  General.  DRAINS:  A 16-French Foley catheter.  Specimen for seeds that had gone into the bladder.  BLOOD LOSS:  Minimal.  COMPLICATIONS:  None.  INDICATIONS:  [name] is a 66 year old white male who was evaluated for prostate induration and elevated PSA.  He was found to have a Gleason 6 adenocarcinoma in 3 cores on the side contralateral to the area of induration.  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  He has elected seed implantation for therapy.  His prostate volume is 33 mL.  FINDINGS OF PROCEDURE:  He was given Cipro.  He was taken to the operating room where general anesthetic was induced.  He was placed in lithotomy position.  The genitalia was prepped with Betadine solution. A Foley catheter was inserted.  Balloon was filled with 10 mL of dilute contrast and the scrotum was then reflected cephalad with an OpSite.  A red rubber rectal catheter was placed and secured.  The ultrasound probe was assembled and inserted, and secured to the fixed base.  The perineum was prepped and the sterile needle grid was placed on the ultrasound carrier.  Stabilization needles were placed and secured.  The nuclear trying device was then used to create an ultrasound based 3D model of the prostate.   This was used for intraoperative real-time radiation planning.  Once the radiation plan was complete, I returned to the procedure and placed the needles according to the plan.  The nuclear trying device then deployed the seeds.  A total of 24 active needles were used for 79 seeds.  Once all the seeds were in position, the ultrasound probe was removed and fluoroscopy was obtained per routine.  The Foley catheter was removed and the genitalia was reprepped with Betadine solution. Cystoscopy was initially performed with a 16-French flexible scope. Examination revealed a submucosal seed in the mid-prostatic urethra with some bleeding from the prostatic urethra.  Inspection of the bladder revealed a few clots, but also some seeds within the bladder.  The ureteral orifices were unremarkable.  The bladder wall was otherwise unremarkable.  At this point, a 22-French rigid cystoscope was passed with a 12-degree lens.  The small clots were evacuated and the four seeds were retrieved and returned to Radiation Oncology.  At this point, a 16-French Foley catheter was inserted.  The balloon was filled with sterile fluid.  The perineum was cleansed.  The dressing was applied after removal of the red rubber rectal catheter.  The patient was taken down from lithotomy position.  His anesthetic was reversed. He was moved to recovery room in stable condition.  There were no complications.     Excell Seltzer. Annabell Howells, M.D.     [name]/MEDQ  D:  10/26/2012  T:  10/27/2012  Job:  [id number]

## 2012-10-29 NOTE — Op Note (Signed)
  Radiation Oncology         [phone] ________________________________  Name: RAYDIN BIELINSKI MRN: [id number]  Date: 09/07/2012  DOB: [birth year]       Prostate Seed Implant  CC:Marga Melnick, MD  No ref. provider found  DIAGNOSIS: 66 year old gentleman with stage T1c adenocarcinoma prostate with Gleason score 3+3 PSA of 4.03 - Favorable Risk  PROCEDURE: Insertion of radioactive I-125 seeds into the prostate gland.  RADIATION DOSE: 145 Gy, definitive therapy.  TECHNIQUE: CHESNEY KLIMASZEWSKI was brought to the operating room with the urologist. He was placed in the dorsolithotomy position. He was catheterized and a rectal tube was inserted. The perineum was shaved, prepped and draped. The ultrasound probe was then introduced into the rectum to see the prostate gland.  TREATMENT DEVICE: A needle grid was attached to the ultrasound probe stand and anchor needles were placed.  COMPLEX ISODOSE CALCULATION: The prostate was imaged in 3D using a sagittal sweep of the prostate probe. These images were transferred to the planning computer. There, the prostate, urethra and rectum were defined on each axial reconstructed image. Then, the software created an optimized plan and a few seed positions were adjusted. Then the accepted plan was uploaded to the seed Selectron afterloading unit.  SPECIAL TREATMENT PROCEDURE/SUPERVISION AND HANDLING: The Nucletron FIRST system was used to place the needles under sagittal guidance. A total of 24 needles were used to deposit 79 seeds in the prostate gland. The individual seed activity was 0.464 mCi for a total implant activity of 36.656 mCi.  COMPLEX SIMULATION: At the end of the procedure, an anterior radiograph of the pelvis was obtained to document seed positioning and count.   Cystoscopy was performed to check the urethra and bladder.  5 Seeds were subsequently found and removed from the bladder leading to a final count of 74 implanted  seeds  MICRODOSIMETRY: At the end of the procedure, the patient was emitting 0.53 mrem/hr at 1 meter. Accordingly, he was considered safe for hospital discharge.  PLAN: The patient will return to the radiation oncology clinic for post implant CT dosimetry in three weeks.   ________________________________  Artist Pais Kathrynn Running, M.D.

## 2012-11-15 ENCOUNTER — Telehealth: Payer: Self-pay | Admitting: *Deleted

## 2012-11-15 DIAGNOSIS — M1990 Unspecified osteoarthritis, unspecified site: Secondary | ICD-10-CM | POA: Insufficient documentation

## 2012-11-15 DIAGNOSIS — Z91018 Allergy to other foods: Secondary | ICD-10-CM | POA: Insufficient documentation

## 2012-11-15 DIAGNOSIS — I10 Essential (primary) hypertension: Secondary | ICD-10-CM | POA: Insufficient documentation

## 2012-11-15 DIAGNOSIS — G43909 Migraine, unspecified, not intractable, without status migrainosus: Secondary | ICD-10-CM | POA: Insufficient documentation

## 2012-11-15 NOTE — Telephone Encounter (Signed)
CALLED PATIENT TO REMIND OF APPTS. FOR 11-16-12, LVM FOR A RETURN CALL 

## 2012-11-16 ENCOUNTER — Ambulatory Visit
Admission: RE | Admit: 2012-11-16 | Discharge: 2012-11-16 | Disposition: A | Discharge status: Home / Self Care | Payer: BC Managed Care – PPO | Source: Ambulatory Visit | Attending: Radiation Oncology | Admitting: Radiation Oncology

## 2012-11-16 ENCOUNTER — Encounter: Payer: Self-pay | Admitting: Radiation Oncology

## 2012-11-16 VITALS — BP 123/72 | HR 55 | Temp 98.1°F | Resp 20 | Wt 151.0 lb

## 2012-11-16 DIAGNOSIS — C61 Malignant neoplasm of prostate: Secondary | ICD-10-CM

## 2012-11-16 DIAGNOSIS — Z79899 Other long term (current) drug therapy: Secondary | ICD-10-CM | POA: Insufficient documentation

## 2012-11-16 DIAGNOSIS — I10 Essential (primary) hypertension: Secondary | ICD-10-CM | POA: Insufficient documentation

## 2012-11-16 NOTE — Progress Notes (Signed)
  Radiation Oncology         [phone] ________________________________  Name: Brian [name] MRN: [id number]  Date: 11/16/2012  DOB: June 25, 1946  COMPLEX SIMULATION NOTE  NARRATIVE:  The patient was brought to the CT Simulation planning suite today following prostate seed implantation approximately one month ago.  Identity was confirmed.  All relevant records and images related to the planned course of therapy were reviewed.  Then, the patient was set-up supine.  CT images were obtained.  The CT images were loaded into the planning software.  Then the prostate and rectum were contoured.  Treatment planning then occurred.  The implanted iodine 125 seeds were identified by the physics staff for projection of radiation distribution  I have requested : 3D Simulation  I have requested a DVH of the following structures: Prostate and rectum.    ________________________________  Artist Pais Kathrynn Running, M.D.

## 2012-11-16 NOTE — Progress Notes (Signed)
Pt states he had dysuria 3-4 days s/p seed implant, none since. He states he is voiding more frequently but is forcing more liquids, has urgency if he postpones. Nocturia x 3. Denies fatigue, loss of appetite, other urinary or bowel issues.

## 2012-11-16 NOTE — Progress Notes (Signed)
Radiation Oncology         [phone] ________________________________  Name: Brian [name] MRN: [id number]  Date: 11/16/2012  DOB: 1946/03/25  Follow-Up Visit Note  CC: Marga Melnick, MD  Pecola Lawless, MD  Diagnosis:   66 year old gentleman with stage T1c adenocarcinoma prostate with Gleason score 3+3 PSA of 4.03 - Favorable Risk  Interval Since Last Radiation:  1 months  Narrative:  The patient returns today for routine follow-up.  He is complaining of increased urinary frequency and urinary hesitation symptoms. He filled out a questionnaire regarding urinary function today providing and overall IPSS score of 13 characterizing his symptoms as moderate.  His pre-implant score was 8. He denies any bowel symptoms.  ALLERGIES:   has no known allergies.  Meds: Current Outpatient Prescriptions  Medication Sig Dispense Refill  . acetaZOLAMIDE (DIAMOX) 250 MG tablet Take 1 tab twice a day started at least one day prior to theascent and continued for 5 days  12 tablet  0  . diltiazem (TIAZAC) 120 MG 24 hr capsule Take 120 mg by mouth every morning.      . fish oil-omega-3 fatty acids 1000 MG capsule Take 2 g by mouth daily. Takes 500 mg daily      . glucosamine-chondroitin 500-400 MG tablet Take 1 tablet by mouth 2 (two) times daily.      . Multiple Vitamins-Minerals (MACUVITE PO) Take by mouth.      Marland Kitchen PRESCRIPTION MEDICATION Inject into the muscle every 30 (thirty) days. ALLERGY INJECTION'S MONTHLY      . SUMAtriptan (IMITREX) 100 MG tablet Take 100 mg by mouth every 2 (two) hours as needed.      Marland Kitchen tiZANidine (ZANAFLEX) 4 MG tablet Take 4 mg by mouth every 6 (six) hours as needed.      . topiramate (TOPAMAX) 200 MG tablet Take 200 mg by mouth daily.       . vardenafil (LEVITRA) 20 MG tablet Take 20 mg by mouth daily as needed. Takes 5 mg daily (cuts tablet in 1/4)      . vitamin C (ASCORBIC ACID) 500 MG tablet Take 500 mg by mouth 2 (two) times daily.      Marland Kitchen  HYDROcodone-acetaminophen (NORCO) 5-325 MG per tablet Take 1 tablet by mouth every 6 (six) hours as needed for pain.  20 tablet  0    Physical Findings: The patient is in no acute distress. Patient is alert and oriented.  weight is 151 lb (68.493 kg). His oral temperature is 98.1 F (36.7 C). His blood pressure is 123/72 and his pulse is 55. His respiration is 20. Marland Kitchen  No significant changes.  Lab Findings: Lab Results  Component Value Date   WBC 4.6 10/19/2012   HGB 13.5 10/19/2012   HCT 39.5 10/19/2012   MCV 87.6 10/19/2012   PLT 151 10/19/2012    Radiographic Findings:  Patient underwent CT imaging in our clinic for post implant dosimetry. The CT appears to demonstrate an adequate distribution of radioactive seeds throughout the prostate gland. There no seeds in her near the rectum. I suspect the final radiation plan and dosimetry will show appropriate coverage of the prostate gland.   Impression: The patient is recovering from the effects of radiation. His urinary symptoms should gradually improve over the next 4-6 months. We talked about this today. He is encouraged by his improvement already and is otherwise please with his outcome.   Plan: Today, I spent time talking to the patient about his  prostate seed implant and resolving urinary symptoms. Which for long-term followup for prostate cancer following seed implant. He understands that ongoing PSA determinations and digital rectal exams will help perform surveillance to rule out disease recurrence. He understands what to expect with his PSA measures. Patient was also educated today about some of the long-term effects would radiation including the Small risk for rectal bleeding and possibly erectile dysfunction. Talked about some of the general management approaches to these potential complications. However, I did encourage the patient to contact her office or return at any point if he has questions or concerns related to his previous  radiation and prostate cancer.   _____________________________________  Artist Pais. Kathrynn Running, M.D.

## 2012-11-22 ENCOUNTER — Telehealth: Payer: Self-pay | Admitting: Radiation Oncology

## 2012-11-22 NOTE — Telephone Encounter (Signed)
[employer] claim forms mailed to pt today.

## 2012-12-04 ENCOUNTER — Encounter: Payer: Self-pay | Admitting: Internal Medicine

## 2012-12-04 ENCOUNTER — Ambulatory Visit (INDEPENDENT_AMBULATORY_CARE_PROVIDER_SITE_OTHER): Payer: BC Managed Care – PPO | Admitting: Internal Medicine

## 2012-12-04 VITALS — BP 120/78 | HR 56 | Temp 98.0°F | Resp 12 | Ht 69.0 in | Wt 151.6 lb

## 2012-12-04 DIAGNOSIS — I10 Essential (primary) hypertension: Secondary | ICD-10-CM

## 2012-12-04 DIAGNOSIS — Z0000 Encounter for general adult medical examination without abnormal findings: Secondary | ICD-10-CM

## 2012-12-04 LAB — LIPID PANEL
HDL: 38.6 mg/dL — ABNORMAL LOW (ref >39.00)
Total CHOL/HDL Ratio: 4

## 2012-12-04 NOTE — Progress Notes (Signed)
  Subjective:    Patient ID: Brian [name], male    DOB: 13-Apr-1946, 66 y.o.   MRN: [id number]  HPI  Brian [name]  is here for a physical; he denies acute issues .      Review of Systems  HYPERTENSION: Disease Monitoring  Blood pressure range: 96/52- 124/80; lowest in eve post meal  Chest pain: no   Dyspnea:no   Claudication: no  Medication compliance:yes Medication Side Effects  Lightheadedness: no  Urinary frequency: since seed implants  Edema: no   Preventitive Healthcare:  Exercise:gym 2X/ week   Diet Pattern: modified for headache prophylaxis  Salt Restriction: no added salt  Extensive labs 10/19/12 were reviewed. All were normal except for a GFR of 87. TSH lipids were not done       Objective:   Physical Exam Gen.: Thin but healthy and well-nourished in appearance. Alert, appropriate and cooperative throughout exam. Head: Normocephalic without obvious abnormalities;  no alopecia  Eyes: No corneal or conjunctival inflammation noted. Pupils equal round reactive to light and accommodation. Fundal exam is benign without hemorrhages, exudate, papilledema. Extraocular motion intact. Vision grossly normal with lenses. Ears: External  ear exam reveals no significant lesions or deformities. Canals clear .TMs normal. Hearing is grossly normal bilaterally. Nose: External nasal exam reveals no deformity or inflammation. Nasal mucosa are pink and moist. No lesions or exudates noted.   Mouth: Oral mucosa and oropharynx reveal no lesions or exudates. Teeth in good repair. Neck: No deformities, masses, or tenderness noted. Range of motion & Thyroid normal. Lungs: Normal respiratory effort; chest expands symmetrically. Lungs are clear to auscultation without rales, wheezes, or increased work of breathing. Heart: Normal rate and rhythm. Normal S1 and S2. No gallop, or rub. Intermittent click @ apex; no murmur. Abdomen: Bowel sounds normal; abdomen soft and nontender. No masses, organomegaly  or hernias noted. Genitalia: Dr Annabell Howells, Urology Musculoskeletal/extremities: No deformity or scoliosis noted of  the thoracic or lumbar spine. No clubbing, cyanosis, edema, or deformity noted. Range of motion  normal .Tone & strength  normal.Joints normal. Nail health  good. Vascular: Carotid, radial artery, dorsalis pedis and  posterior tibial pulses are full and equal. No bruits present. Neurologic: Alert and oriented x3. Deep tendon reflexes symmetrical and normal.          Skin: Intact without suspicious lesions or rashes. Lymph: No cervical, axillary lymphadenopathy present. Psych: Mood and affect are normal. Normally interactive                                                                                         Assessment & Plan:  #1 comprehensive physical exam; no acute findings  #2 diltiazem will be discontinued and he will monitor blood pressure and keep a headache diary. The diltiazem is low-dose and may not be needed at this time Plan: see Orders

## 2012-12-04 NOTE — Patient Instructions (Addendum)
Preventive Health Care: Exercise at least 30-45 minutes a day,  3-4 days a week.  Eat a low-fat diet with lots of fruits and vegetables, up to 7-9 servings per day.  Consume less than 40 grams of sugar per day from foods & drinks with High Fructose Corn Sugar as #1,2,3 or # 4 on label. Stop Diltiazem & monitor BP & headaches. Blood Pressure Goal  Ideally is an AVERAGE < 135/85. This AVERAGE should be calculated from @ least 5-7 BP readings taken @ different times of day on different days of week. You should not respond to isolated BP readings , but rather the AVERAGE for that week

## 2013-01-03 ENCOUNTER — Encounter: Payer: Self-pay | Admitting: Radiation Oncology

## 2013-01-03 NOTE — Progress Notes (Signed)
  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 He underwent CT scan to delineate the three-dimensional structures of the pelvis and demonstrate the radiation distribution.  Results:   Prostate Coverage - The dose of radiation delivered to the 90% or more of the prostate gland (D90) was 104% of the prescription dose. This exceeds our goal of greater than 90%. Rectal Sparing - The volume of rectal tissue receiving the prescription dose or higher was 0.0 cc. This falls under our thresholds tolerance of 1.0 cc.  Impression: The prostate seed implant appears to show adequate target coverage and appropriate rectal sparing.  Plan:  The patient will continue to follow with urology for ongoing PSA determinations. I would anticipate a high likelihood for local tumor control with minimal risk for rectal morbidity.   Artist Pais Kathrynn Running, M.D.

## 2013-01-12 ENCOUNTER — Telehealth: Payer: Self-pay | Admitting: *Deleted

## 2013-01-12 NOTE — Telephone Encounter (Signed)
Great readings; no meds unless BP averages > 140/90

## 2013-01-12 NOTE — Telephone Encounter (Signed)
Patient called triage to give results of average B/P readings since being taken off B/P meds at Dec CPE visit. Average reading 115/66 with the two highest readings being 124/77 and 126/73.

## 2013-02-21 ENCOUNTER — Encounter: Payer: Self-pay | Admitting: Internal Medicine

## 2013-03-21 ENCOUNTER — Encounter: Payer: Self-pay | Admitting: Internal Medicine

## 2013-03-21 ENCOUNTER — Ambulatory Visit (INDEPENDENT_AMBULATORY_CARE_PROVIDER_SITE_OTHER): Payer: BC Managed Care – PPO | Admitting: Internal Medicine

## 2013-03-21 VITALS — BP 118/80 | HR 55 | Temp 98.3°F | Wt 149.0 lb

## 2013-03-21 DIAGNOSIS — J0110 Acute frontal sinusitis, unspecified: Secondary | ICD-10-CM

## 2013-03-21 MED ORDER — CEFUROXIME AXETIL 500 MG PO TABS: 500.0000 mg | ORAL_TABLET | Freq: Two times a day (BID) | ORAL | Status: DC

## 2013-03-21 MED ORDER — FLUTICASONE PROPIONATE 50 MCG/ACT NA SUSP: 1.0000 | Freq: Two times a day (BID) | NASAL | Status: DC | PRN

## 2013-03-21 NOTE — Progress Notes (Signed)
  Subjective:    Patient ID: Brian [name], male    DOB: [birth year], 67 y.o.   MRN: [id number]  HPI   Symptoms began 03/19/13 as a sore throat; this responded to Zicam. He is also on maintenance vitamin C.  He had been exposed to a  grandchild who had nasal congestion.  He had some associated rhinitis. There was an exacerbation of his symptoms with frontal headache after flying. As of 3/25 he has began to produce yellow/bloody nasal secretions.  He has some ear pressure in flight but this cleared with valsalva maneuver. There was no associated ear pain or discharge.    Review of Systems He denies fever, chills, or sweats. He has had no associated chest congestion, sputum production, shortness of breath, wheezing.     Objective:   Physical Exam General appearance:Thin but in good health ;well nourished; no acute distress or increased work of breathing is present.  No  lymphadenopathy about the head, neck, or axilla noted.   Eyes: No conjunctival inflammation or lid edema is present.   Ears:  External ear exam shows no significant lesions or deformities.  Otoscopic examination reveals clear canals, tympanic membranes are intact bilaterally without bulging, retraction, inflammation or discharge.  Nose:  External nasal examination shows no deformity or inflammation. Nasal mucosa are pink and moist without lesions or exudates. No septal dislocation or deviation.No obstruction to airflow.   Oral exam: Dental hygiene is good; lips and gums are healthy appearing.There is no oropharyngeal erythema or exudate noted.   Neck:  No deformities,  masses, or tenderness noted.     Heart:  Normal rate and regular rhythm. S1 and S2 normal without gallop, murmur, click, rub or other extra sounds. S4 with slurring  Lungs:Chest clear to auscultation; no wheezes, rhonchi,rales ,or rubs present.No increased work of breathing.    Extremities:  No cyanosis, edema, or clubbing  noted    Skin: Warm & dry           Assessment & Plan:  #1 rhinosinusitis without significant bronchitis  Plan: Nasal hygiene interventions discussed. See prescription medications

## 2013-03-21 NOTE — Patient Instructions (Addendum)

## 2013-04-02 ENCOUNTER — Encounter: Payer: Self-pay | Admitting: Internal Medicine

## 2013-07-10 ENCOUNTER — Encounter: Payer: Self-pay | Admitting: *Deleted

## 2013-11-01 ENCOUNTER — Other Ambulatory Visit: Payer: Self-pay

## 2013-12-06 ENCOUNTER — Encounter: Payer: Self-pay | Admitting: Internal Medicine

## 2013-12-13 ENCOUNTER — Telehealth: Payer: Self-pay

## 2013-12-13 NOTE — Addendum Note (Signed)
Addended by: Wende Mott on: 12/13/2013 02:53 PM   Modules accepted: Orders

## 2013-12-13 NOTE — Telephone Encounter (Addendum)
Medication List and allergies:  Reviewed and updated  90 day supply/mail order: na Local prescriptions: [facility]  Immunizations due: PNA  A/P:   No changes to FH or PSH or personal hx Flu vaccine--09/26/2013 Tdap--09/2011 Shingles--04/2011 CCS--05/2010--Dr Stark--benign polyps--next due 2016 PSA--02/2012--sees Dr Annabell Howells Urology--last OV 08/2013  To Discuss with Provider: Not at this time

## 2013-12-13 NOTE — Telephone Encounter (Addendum)
Left message for call back  identifiable     

## 2013-12-14 ENCOUNTER — Ambulatory Visit (INDEPENDENT_AMBULATORY_CARE_PROVIDER_SITE_OTHER): Payer: Medicare Other | Admitting: Internal Medicine

## 2013-12-14 ENCOUNTER — Encounter: Payer: Self-pay | Admitting: Internal Medicine

## 2013-12-14 VITALS — BP 138/79 | HR 59 | Temp 98.5°F | Ht 68.25 in | Wt 156.4 lb

## 2013-12-14 DIAGNOSIS — I10 Essential (primary) hypertension: Secondary | ICD-10-CM

## 2013-12-14 DIAGNOSIS — T7589XS Other specified effects of external causes, sequela: Secondary | ICD-10-CM

## 2013-12-14 DIAGNOSIS — Z0000 Encounter for general adult medical examination without abnormal findings: Secondary | ICD-10-CM

## 2013-12-14 DIAGNOSIS — Z860100 Personal history of colon polyps, unspecified: Secondary | ICD-10-CM

## 2013-12-14 DIAGNOSIS — Z23 Encounter for immunization: Secondary | ICD-10-CM

## 2013-12-14 DIAGNOSIS — E786 Lipoprotein deficiency: Secondary | ICD-10-CM

## 2013-12-14 DIAGNOSIS — T781XXS Other adverse food reactions, not elsewhere classified, sequela: Secondary | ICD-10-CM

## 2013-12-14 DIAGNOSIS — T788XXS Other adverse effects, not elsewhere classified, sequela: Secondary | ICD-10-CM

## 2013-12-14 LAB — CBC WITH DIFFERENTIAL/PLATELET
Basophils Relative: 0.4 % (ref 0.0–3.0)
HCT: 45.5 % (ref 39.0–52.0)
Hemoglobin: 15.2 g/dL (ref 13.0–17.0)
Lymphocytes Relative: 32.7 % (ref 12.0–46.0)
MCHC: 33.4 g/dL (ref 30.0–36.0)
Monocytes Relative: 8.7 % (ref 3.0–12.0)
Neutro Abs: 3 10*3/uL (ref 1.4–7.7)
RBC: 5.09 Mil/uL (ref 4.22–5.81)

## 2013-12-14 LAB — BASIC METABOLIC PANEL
CO2: 29 mEq/L (ref 19–32)
Calcium: 9.4 mg/dL (ref 8.4–10.5)
Potassium: 4.3 mEq/L (ref 3.5–5.1)
Sodium: 141 mEq/L (ref 135–145)

## 2013-12-14 LAB — HEPATIC FUNCTION PANEL
AST: 23 U/L (ref 0–37)
Albumin: 4.6 g/dL (ref 3.5–5.2)
Alkaline Phosphatase: 74 U/L (ref 39–117)
Total Protein: 6.9 g/dL (ref 6.0–8.3)

## 2013-12-14 LAB — TSH: TSH: 2.94 u[IU]/mL (ref 0.35–5.50)

## 2013-12-14 MED ORDER — VARDENAFIL HCL 20 MG PO TABS: 20.0000 mg | ORAL_TABLET | Freq: Every day | ORAL | Status: DC | PRN

## 2013-12-14 NOTE — Progress Notes (Signed)
Subjective:    Patient ID: Brian [name], male    DOB: October 10, 1946, 67 y.o.   MRN: [id number]  HPI  Medicare Wellness Visit: Psychosocial and medical history were reviewed as required by Medicare (history related to abuse, antisocial behavior , firearm risk). Social history: Caffeine:no  , Alcohol:no  , Tobacco use:no Exercise: Gym 2 days / week; trainer 1 day a week. Personal safety/fall risk:no Limitations of activities of daily living:no Seatbelt/ smoke alarm use:yes Healthcare Power of Attorney/Living Will status: in place Ophthalmologic exam status:current Hearing evaluation status: current Orientation: Oriented X 3 Memory and recall: good Spelling or math testing: good Depression/anxiety assessment: no Foreign travel history: [place] 10/14 Immunization status for influenza/pneumonia/ shingles /tetanus: PNA today Transfusion history:no Preventive health care maintenance status: Colonoscopy as per protocol/standard care:current Dental care:every 6 mos Chart reviewed and updated. Active issues reviewed and addressed as documented below.    Review of Systems  Blood pressure range   @ home 120-125/65-68. Off anti hypertemsive medication since 12/13; he is on no added salt diet. No lightheadedness ,significant headaches, epistaxis, chest pain, palpitations, exertional dyspnea, claudication, paroxysmal nocturnal dyspnea, or edema absent. Topamax D/Ced as headaches improved off NSAIDS.      Objective:   Physical Exam Gen.: Thin but healthy and well-nourished in appearance. Alert, appropriate and cooperative throughout exam.Appears younger than stated age  Head: Normocephalic without obvious abnormalities  Eyes: No corneal or conjunctival inflammation noted. Pupils equal round reactive to light and accommodation. Extraocular motion intact.  Ears: External  ear exam reveals no significant lesions or deformities. Canals clear .TMs normal.  Nose: External nasal exam  reveals no deformity or inflammation. Nasal mucosa are pink and moist. No lesions or exudates noted. Septum  Slightly dislocated  Mouth: Oral mucosa and oropharynx reveal no lesions or exudates. Teeth in good repair. Neck: No deformities, masses, or tenderness noted. Range of motion good. Thyroid small. Lungs: Normal respiratory effort; chest expands symmetrically. Lungs are clear to auscultation without rales, wheezes, or increased work of breathing. Heart: Normal rate and rhythm. Normal S1 and S2. No gallop or rub. Mitral click(soft) ; no murmur. Abdomen: Bowel sounds normal; abdomen soft and nontender. No masses, organomegaly or hernias noted. Genitalia:  as per Dr Annabell Howells                                  Musculoskeletal/extremities: No deformity or scoliosis noted of  the thoracic or lumbar spine.   No clubbing, cyanosis, edema, or significant extremity  deformity noted. Range of motion normal .Tone & strength normal. Hand joints normal . Fingernail  health good. Able to lie down & sit up w/o help. Negative SLR bilaterally Vascular: Carotid, radial artery, dorsalis pedis and  posterior tibial pulses are full and equal. No bruits present. Neurologic: Alert and oriented x3. Deep tendon reflexes symmetrical and normal.        Skin: Intact without suspicious lesions or rashes. Lymph: No cervical, axillary lymphadenopathy present. Psych: Mood and affect are normal. Normally interactive  Assessment & Plan:  #1 Medicare Wellness Exam; criteria met ; data entered #2 Problem List/Diagnoses reviewed Plan:  Assessments made/ Orders entered  

## 2013-12-14 NOTE — Progress Notes (Signed)
Pre visit review using our clinic review tool, if applicable. No additional management support is needed unless otherwise documented below in the visit note. 

## 2013-12-14 NOTE — Patient Instructions (Signed)

## 2014-01-15 ENCOUNTER — Encounter: Payer: Self-pay | Admitting: Internal Medicine

## 2014-01-15 ENCOUNTER — Telehealth: Payer: Self-pay | Admitting: Gastroenterology

## 2014-01-15 NOTE — Telephone Encounter (Signed)
Wife wanted to verify the type of polyp the patient had in 2011.  She is advised it was a tubular adenoma and he is due to colon 05/2015.  She will call back for any additional questions or concerns

## 2014-05-27 ENCOUNTER — Other Ambulatory Visit: Payer: Self-pay | Admitting: Internal Medicine

## 2014-05-27 ENCOUNTER — Encounter: Payer: Self-pay | Admitting: Internal Medicine

## 2014-05-27 MED ORDER — VARDENAFIL HCL 20 MG PO TABS: 20.0000 mg | ORAL_TABLET | Freq: Every day | ORAL | Status: DC | PRN

## 2014-05-28 NOTE — Telephone Encounter (Signed)
Message copied by Izola Price on Tue May 28, 2014  2:49 PM ------      Message from: Hendricks Limes      Created: Mon May 27, 2014  6:44 PM        Please fax a prescription to [facility]; please see his my chart message ------

## 2014-05-28 NOTE — Telephone Encounter (Signed)
How many milligrams and how many refills?

## 2014-05-30 ENCOUNTER — Telehealth: Payer: Self-pay

## 2014-05-30 NOTE — Telephone Encounter (Signed)
Levitra script has been faxed to [facility] [phone]

## 2014-09-10 ENCOUNTER — Encounter: Payer: Self-pay | Admitting: Gastroenterology

## 2014-09-19 ENCOUNTER — Encounter: Payer: Self-pay | Admitting: Nurse Practitioner

## 2014-09-19 ENCOUNTER — Ambulatory Visit (INDEPENDENT_AMBULATORY_CARE_PROVIDER_SITE_OTHER): Payer: Medicare Other | Admitting: Nurse Practitioner

## 2014-09-19 VITALS — BP 118/78 | HR 52 | Temp 98.2°F | Ht 68.25 in | Wt 156.5 lb

## 2014-09-19 DIAGNOSIS — Z0000 Encounter for general adult medical examination without abnormal findings: Secondary | ICD-10-CM

## 2014-09-19 NOTE — Progress Notes (Signed)
Pre visit review using our clinic review tool, if applicable. No additional management support is needed unless otherwise documented below in the visit note. 

## 2014-09-19 NOTE — Patient Instructions (Signed)
Follow up with Dr. Linna Darner as scheduled Get influenza vaccine Call clinic with questions or concerns.

## 2014-09-19 NOTE — Progress Notes (Signed)
Subjective:    Brian [name] is a 68 y.o. male who presents for Medicare Annual/Subsequent preventive examination.   Preventive Screening-Counseling & Management  Tobacco History  Smoking status  . Never Smoker   Smokeless tobacco  . Never Used    Problems Prior to Visit 1.   Current Problems (verified) Patient Active Problem List   Diagnosis Date Noted  . Multiple food allergies   . 68 year old gentleman with stage T1c vs. T2a adenocarcinoma prostate with Gleason score 3+3 PSA of 4.03 - Favorable Risk 06/14/2012    Class: Acute  . Malignant neoplasm of prostate 03/20/2012  . DIVERTICULOSIS, COLON 12/02/2008  . COLONIC POLYPS, HX OF 12/02/2008  . LOW HDL 02/12/2008  . Transient elevated blood pressure 02/12/2008  . Headache 02/12/2008    Medications Prior to Visit Current Outpatient Prescriptions on File Prior to Visit  Medication Sig Dispense Refill  . EPIPEN 2-PAK 0.3 MG/0.3ML SOAJ injection Inject 1 mg into the muscle as needed.      . fish oil-omega-3 fatty acids 1000 MG capsule Take 1 g by mouth daily. Takes 500 mg daily      . fluticasone (FLONASE) 50 MCG/ACT nasal spray Place 1 spray into the nose 2 (two) times daily as needed for rhinitis.  16 g  2  . glucosamine-chondroitin 500-400 MG tablet Take 1 tablet by mouth daily.       . Multiple Vitamins-Minerals (MACUVITE PO) Take by mouth.      Marland Kitchen PRESCRIPTION MEDICATION Inject into the muscle every 30 (thirty) days. ALLERGY INJECTION'S MONTHLY      . SUMAtriptan (IMITREX) 100 MG tablet Take 100 mg by mouth every 2 (two) hours as needed.      . vardenafil (LEVITRA) 20 MG tablet Take 1 tablet (20 mg total) by mouth daily as needed. Takes 5 mg daily (cuts tablet in 1/4)  10 tablet  2  . vitamin C (ASCORBIC ACID) 500 MG tablet Take 500 mg by mouth daily.        No current facility-administered medications on file prior to visit.    Current Medications (verified) Current Outpatient Prescriptions  Medication Sig  Dispense Refill  . EPIPEN 2-PAK 0.3 MG/0.3ML SOAJ injection Inject 1 mg into the muscle as needed.      . fish oil-omega-3 fatty acids 1000 MG capsule Take 1 g by mouth daily. Takes 500 mg daily      . fluticasone (FLONASE) 50 MCG/ACT nasal spray Place 1 spray into the nose 2 (two) times daily as needed for rhinitis.  16 g  2  . glucosamine-chondroitin 500-400 MG tablet Take 1 tablet by mouth daily.       . Multiple Vitamins-Minerals (MACUVITE PO) Take by mouth.      Marland Kitchen PRESCRIPTION MEDICATION Inject into the muscle every 30 (thirty) days. ALLERGY INJECTION'S MONTHLY      . SUMAtriptan (IMITREX) 100 MG tablet Take 100 mg by mouth every 2 (two) hours as needed.      . vardenafil (LEVITRA) 20 MG tablet Take 1 tablet (20 mg total) by mouth daily as needed. Takes 5 mg daily (cuts tablet in 1/4)  10 tablet  2  . vitamin C (ASCORBIC ACID) 500 MG tablet Take 500 mg by mouth daily.        No current facility-administered medications for this visit.     Allergies (verified) Review of patient's allergies indicates no known allergies.   PAST HISTORY  Family History Family History  Problem Relation Age of  Onset  . Diabetes Mother   . Hypertension Mother   . Hypertension Father   . Heart failure Father   . Hypertension Maternal Grandmother   . Diabetes Maternal Grandmother   . Stroke Maternal Grandfather     > 55  . Bone cancer Paternal Grandfather   . Heart disease Paternal Grandmother     Social History History  Substance Use Topics  . Smoking status: Never Smoker   . Smokeless tobacco: Never Used  . Alcohol Use: No    Are there smokers in your home (other than you)?  No  Risk Factors Current exercise habits: Gym/ health club routine includes cardio and mod to heavy weightlifting.  Dietary issues discussed: eat well balanced diet   Cardiac risk factors: advanced age (older than 71 for men, 40 for women), hypertension and male gender.  Depression Screen (Note: if answer to  either of the following is "Yes", a more complete depression screening is indicated)   Q1: Over the past two weeks, have you felt down, depressed or hopeless? No  Q2: Over the past two weeks, have you felt little interest or pleasure in doing things? No  Have you lost interest or pleasure in daily life? No  Do you often feel hopeless? No  Do you cry easily over simple problems? No  Activities of Daily Living In your present state of health, do you have any difficulty performing the following activities?:  Driving? No Managing money?  No Feeding yourself? No Getting from bed to chair? No Climbing a flight of stairs? No Preparing food and eating?: No Bathing or showering? No Getting dressed: No Getting to the toilet? No Using the toilet:No Moving around from place to place: No In the past year have you fallen or had a near fall?:No   Are you sexually active?  Yes  Do you have more than one partner?  No  Hearing Difficulties: No Do you often ask people to speak up or repeat themselves? No Do you experience ringing or noises in your ears? No Do you have difficulty understanding soft or whispered voices? No   Do you feel that you have a problem with memory? No  Do you often misplace items? No  Do you feel safe at home?  Yes  Cognitive Testing  Alert? Yes  Normal Appearance?Yes  Oriented to person? Yes  Place? Yes   Time? Yes  Recall of three objects?  Yes  Can perform simple calculations? Yes  Displays appropriate judgment?Yes  Can read the correct time from a watch face?Yes   Advanced Directives have been discussed with the patient? Yes   List the Names of Other Physician/Practitioners you currently use: 1.    Indicate any recent Medical Services you may have received from other than [facility] providers in the past year (date may be approximate).  Immunization History  Administered Date(s) Administered  . Influenza Whole 10/27/2012, 09/26/2013  . Pneumococcal  Polysaccharide-23 12/14/2013  . Tdap 10/01/2011  . Zoster 05/04/2011    Screening Tests Health Maintenance  Topic Date Due  . Influenza Vaccine  07/27/2014  . Colonoscopy  05/30/2015  . Tetanus/tdap  09/30/2021  . Pneumococcal Polysaccharide Vaccine Age 58 And Over  Completed  . Zostavax  Completed    All answers were reviewed with the patient and necessary referrals were made:  Carmelina Paddock, NP   09/19/2014   History reviewed: allergies, current medications, past family history, past medical history, past social history, past surgical history and  problem list  Review of Systems not indicated    Objective:     Vision by Snellen chart: right eye:20/20, left eye:eye exam 6/15 Blood pressure 118/78, pulse 52, temperature 98.2 F (36.8 C), temperature source Oral, height 5' 8.25" (1.734 m), weight 156 lb 8 oz (70.988 kg), SpO2 97.00%. Body mass index is 23.61 kg/(m^2).  Not indicted      Assessment:     Patient presents for yearly preventative medicine examination. Medicare questionnaire was completed  All immunizations and health maintenance protocols were reviewed with the patient and needed orders were placed.  Appropriate screening laboratory values were ordered for the patient including screening of hyperlipidemia, renal function and hepatic function. If indicated by BPH, a PSA was ordered.  Medication reconciliation,  past medical history, social history, problem list and allergies were reviewed in detail with the patient  Goals were established with regard to weight loss, exercise, and  diet in compliance with medications  End of life planning was discussed.        Plan:     During the course of the visit the patient was educated and counseled about appropriate screening and preventive services including:    Influenza vaccine  Advanced directives: has an advanced directive - a copy HAS NOT been provided.  Diet review for nutrition referral? Yes  ____  Not Indicated _x___   Patient Instructions (the written plan) was given to the patient.  Medicare Attestation I have personally reviewed: The patient's medical and social history Their use of alcohol, tobacco or illicit drugs Their current medications and supplements The patient's functional ability including ADLs,fall risks, home safety risks, cognitive, and hearing and visual impairment Diet and physical activities Evidence for depression or mood disorders  The patient's weight, height, BMI, and visual acuity have been recorded in the chart.  I have made referrals, counseling, and provided education to the patient based on review of the above and I have provided the patient with a written personalized care plan for preventive services.   Follow up with Dr. Linna Darner as scheduled Will get influenza HD vaccine at [facility] Department Call clinic with questions or concerns Continue current diet and exercise.    Steva Colder A, NP   09/19/2014

## 2014-11-12 ENCOUNTER — Other Ambulatory Visit (INDEPENDENT_AMBULATORY_CARE_PROVIDER_SITE_OTHER): Payer: Medicare Other

## 2014-11-12 ENCOUNTER — Ambulatory Visit (INDEPENDENT_AMBULATORY_CARE_PROVIDER_SITE_OTHER): Payer: Medicare Other | Admitting: Internal Medicine

## 2014-11-12 ENCOUNTER — Encounter: Payer: Self-pay | Admitting: Internal Medicine

## 2014-11-12 VITALS — BP 120/80 | HR 58 | Temp 98.2°F | Resp 12 | Ht 68.0 in | Wt 153.0 lb

## 2014-11-12 DIAGNOSIS — C61 Malignant neoplasm of prostate: Secondary | ICD-10-CM

## 2014-11-12 DIAGNOSIS — Z86010 Personal history of colonic polyps: Secondary | ICD-10-CM

## 2014-11-12 DIAGNOSIS — E786 Lipoprotein deficiency: Secondary | ICD-10-CM

## 2014-11-12 DIAGNOSIS — Z79899 Other long term (current) drug therapy: Secondary | ICD-10-CM

## 2014-11-12 DIAGNOSIS — R030 Elevated blood-pressure reading, without diagnosis of hypertension: Secondary | ICD-10-CM | POA: Diagnosis not present

## 2014-11-12 DIAGNOSIS — Z0000 Encounter for general adult medical examination without abnormal findings: Secondary | ICD-10-CM

## 2014-11-12 LAB — LIPID PANEL
CHOLESTEROL: 157 mg/dL (ref 0–200)
HDL: 39.9 mg/dL (ref >39.00)
LDL CALC: 96 mg/dL (ref 0–99)
NonHDL: 117.1
Total CHOL/HDL Ratio: 4
Triglycerides: 107 mg/dL (ref 0.0–149.0)
VLDL: 21.4 mg/dL (ref 0.0–40.0)

## 2014-11-12 LAB — CBC WITH DIFFERENTIAL/PLATELET
Basophils Absolute: 0.1 10*3/uL (ref 0.0–0.1)
Basophils Relative: 0.9 % (ref 0.0–3.0)
EOS PCT: 2.6 % (ref 0.0–5.0)
Eosinophils Absolute: 0.2 10*3/uL (ref 0.0–0.7)
HCT: 46.1 % (ref 39.0–52.0)
Hemoglobin: 15 g/dL (ref 13.0–17.0)
Lymphocytes Relative: 26 % (ref 12.0–46.0)
Lymphs Abs: 1.9 10*3/uL (ref 0.7–4.0)
MCHC: 32.6 g/dL (ref 30.0–36.0)
MCV: 88.7 fl (ref 78.0–100.0)
MONO ABS: 0.5 10*3/uL (ref 0.1–1.0)
Monocytes Relative: 6.2 % (ref 3.0–12.0)
NEUTROS PCT: 64.3 % (ref 43.0–77.0)
Neutro Abs: 4.8 10*3/uL (ref 1.4–7.7)
PLATELETS: 210 10*3/uL (ref 150.0–400.0)
RBC: 5.2 Mil/uL (ref 4.22–5.81)
RDW: 13.5 % (ref 11.5–15.5)
WBC: 7.4 10*3/uL (ref 4.0–10.5)

## 2014-11-12 LAB — HEPATIC FUNCTION PANEL
ALT: 21 U/L (ref 0–53)
AST: 24 U/L (ref 0–37)
Albumin: 4.7 g/dL (ref 3.5–5.2)
Alkaline Phosphatase: 68 U/L (ref 39–117)
BILIRUBIN TOTAL: 1.1 mg/dL (ref 0.2–1.2)
Bilirubin, Direct: 0.2 mg/dL (ref 0.0–0.3)
Total Protein: 7 g/dL (ref 6.0–8.3)

## 2014-11-12 LAB — BASIC METABOLIC PANEL
BUN: 13 mg/dL (ref 6–23)
CALCIUM: 9.4 mg/dL (ref 8.4–10.5)
CO2: 24 mEq/L (ref 19–32)
CREATININE: 0.9 mg/dL (ref 0.4–1.5)
Chloride: 108 mEq/L (ref 96–112)
GFR: 87.4 mL/min (ref >60.00)
Glucose, Bld: 82 mg/dL (ref 70–99)
Potassium: 4.4 mEq/L (ref 3.5–5.1)
Sodium: 138 mEq/L (ref 135–145)

## 2014-11-12 NOTE — Assessment & Plan Note (Signed)
Blood pressure goals reviewed. BMET 

## 2014-11-12 NOTE — Assessment & Plan Note (Signed)
CBC

## 2014-11-12 NOTE — Patient Instructions (Signed)

## 2014-11-12 NOTE — Assessment & Plan Note (Signed)
CBC & dif Colonoscopy ? 2016

## 2014-11-12 NOTE — Progress Notes (Signed)
Pre visit review using our clinic review tool, if applicable. No additional management support is needed unless otherwise documented below in the visit note. 

## 2014-11-12 NOTE — Assessment & Plan Note (Signed)
Lipid panel 

## 2014-11-12 NOTE — Progress Notes (Signed)
Subjective:    Patient ID: Brian [name], male    DOB: [birth year], 68 y.o.   MRN: [id number]  HPI Medicare Wellness Visit: Psychosocial and medical history were reviewed as required by Medicare (history related to abuse, antisocial behavior , firearm risk). Social history: Caffeine:none  , Alcohol: no , Tobacco XBM:WUXLK Exercise:90 min 2X/ week Personal safety/fall risk:no Limitations of activities of daily living:no Seatbelt/ smoke alarm use:yes Healthcare Power of Attorney/Living Will status: UTD & on file Ophthalmologic exam status:UTD Hearing evaluation status:UTD Orientation: Oriented X 3 Memory and recall: good Math testing: good Depression/anxiety assessment: no Foreign travel history:[place] 10/15 Immunization status for influenza/pneumonia/ shingles /tetanus: ? Prevnar needed Transfusion history:no Preventive health care maintenance status: Colonoscopy as per protocol/standard care:due 2016 Dental care:every 6 mos Chart reviewed and updated. Active issues reviewed and addressed as documented below.  He is a heart healthy diet; exercise program as described above. He has no associated cardiopulmonary symptoms.  He has had a low HDL in the past.  He has had elevated blood pressure intermittently but no sustained hypertension or diagnosis of such. Blood pressure at home ranges 118-120/70-76.  He did have a sessile polyp in 2011; colonoscopy should be repeated next year unless GI states otherwise.  He is actively followed by his urologist; PSA was 0.5 recently. He had seed implants in 2013.    Review of Systems     Chest pain, palpitations, tachycardia, exertional dyspnea, paroxysmal nocturnal dyspnea, claudication or edema are absent.  Unexplained weight loss, abdominal pain, significant dyspepsia, dysphagia, melena, rectal bleeding, or persistently small caliber stools are denied.        Objective:   Physical Exam Gen.: Healthy and well-nourished in  appearance. Alert, appropriate and cooperative throughout exam. Appears younger than stated age  Head: Normocephalic without obvious abnormalities; no alopecia  Eyes: No corneal or conjunctival inflammation noted. Pupils equal round reactive to light and accommodation. Extraocular motion intact. Ptosis Ears: External  ear exam reveals no significant lesions or deformities. Canals clear .TMs normal. Hearing is grossly normal bilaterally. Nose: External nasal exam reveals no deformity or inflammation. Nasal mucosa are pink and moist. No lesions or exudates noted.  Septal dislocation to R Mouth: Oral mucosa and oropharynx reveal no lesions or exudates. Teeth in good repair. Neck: No deformities, masses, or tenderness noted. Range of motion & Thyroid normal. Lungs: Normal respiratory effort; chest expands symmetrically. Lungs are clear to auscultation without rales, wheezes, or increased work of breathing. Heart: Normal rate and rhythm. Normal S1 and S2. No gallop, click, or rub. No murmur.S4 Abdomen: Bowel sounds normal; abdomen soft and nontender. No masses, organomegaly or hernias noted. Genitalia: as per Urology                                 Musculoskeletal/extremities: No deformity or scoliosis noted of  the thoracic or lumbar spine.  No clubbing, cyanosis, edema, or significant extremity  deformity noted. Range of motion normal .Tone & strength normal. Hand joints normal Able to lie down & sit up w/o help. Negative SLR bilaterally Vascular: Carotid, radial artery, dorsalis pedis and  posterior tibial pulses are full and equal. No bruits present. Neurologic: Alert and oriented x3. Deep tendon reflexes symmetrical and normal.  Gait normal .      Skin: Intact without suspicious lesions or rashes. Lymph: No cervical, axillary lymphadenopathy present. Psych: Mood and affect are normal. Normally interactive  Assessment & Plan:  See Current Assessment & Plan in Problem List under specific DiagnosisThe labs will be reviewed and risks and options assessed. Written recommendations will be provided by mail or directly through My Chart.Further evaluation or change in medical therapy will be directed by those results.

## 2014-11-13 LAB — TSH: TSH: 3.83 u[IU]/mL (ref 0.35–4.50)

## 2015-01-28 ENCOUNTER — Ambulatory Visit (INDEPENDENT_AMBULATORY_CARE_PROVIDER_SITE_OTHER): Payer: Medicare Other

## 2015-01-28 DIAGNOSIS — Z23 Encounter for immunization: Secondary | ICD-10-CM

## 2015-04-01 ENCOUNTER — Other Ambulatory Visit: Payer: Self-pay

## 2015-04-01 MED ORDER — FLUTICASONE PROPIONATE 50 MCG/ACT NA SUSP: 1.0000 | Freq: Two times a day (BID) | NASAL | Status: DC | PRN

## 2015-05-02 ENCOUNTER — Encounter: Payer: Self-pay | Admitting: Gastroenterology

## 2015-06-10 ENCOUNTER — Ambulatory Visit (AMBULATORY_SURGERY_CENTER): Payer: Self-pay | Admitting: *Deleted

## 2015-06-10 VITALS — Ht 68.0 in | Wt 157.8 lb

## 2015-06-10 DIAGNOSIS — Z86010 Personal history of colonic polyps: Secondary | ICD-10-CM

## 2015-06-10 MED ORDER — NA SULFATE-K SULFATE-MG SULF 17.5-3.13-1.6 GM/177ML PO SOLN: 1.0000 | Freq: Once | ORAL | Status: DC

## 2015-06-10 NOTE — Progress Notes (Signed)
No egg or soy allergy No issues with past sedation No diet pills No home 02 use emmi video to e mail  

## 2015-06-24 ENCOUNTER — Ambulatory Visit (AMBULATORY_SURGERY_CENTER): Payer: Medicare Other | Admitting: Gastroenterology

## 2015-06-24 ENCOUNTER — Encounter: Payer: Self-pay | Admitting: Gastroenterology

## 2015-06-24 VITALS — BP 111/77 | HR 45 | Temp 96.6°F | Resp 21 | Ht 68.0 in | Wt 157.0 lb

## 2015-06-24 DIAGNOSIS — D122 Benign neoplasm of ascending colon: Secondary | ICD-10-CM

## 2015-06-24 DIAGNOSIS — Z860100 Personal history of colon polyps, unspecified: Secondary | ICD-10-CM

## 2015-06-24 DIAGNOSIS — D126 Benign neoplasm of colon, unspecified: Secondary | ICD-10-CM | POA: Diagnosis not present

## 2015-06-24 DIAGNOSIS — Z8371 Family history of colonic polyps: Secondary | ICD-10-CM

## 2015-06-24 DIAGNOSIS — Z83719 Family history of colon polyps, unspecified: Secondary | ICD-10-CM

## 2015-06-24 MED ORDER — SODIUM CHLORIDE 0.9 % IV SOLN: 500.0000 mL | INTRAVENOUS | Status: DC

## 2015-06-24 NOTE — Progress Notes (Signed)
Report to PACU, RN, vss, BBS= Clear.  

## 2015-06-24 NOTE — Patient Instructions (Signed)
YOU HAD AN ENDOSCOPIC PROCEDURE TODAY AT [facility]:   Refer to the procedure report that was given to you for any specific questions about what was found during the examination.  If the procedure report does not answer your questions, please call your gastroenterologist to clarify.  If you requested that your care partner not be given the details of your procedure findings, then the procedure report has been included in a sealed envelope for you to review at your convenience later.  YOU SHOULD EXPECT: Some feelings of bloating in the abdomen. Passage of more gas than usual.  Walking can help get rid of the air that was put into your GI tract during the procedure and reduce the bloating. If you had a lower endoscopy (such as a colonoscopy or flexible sigmoidoscopy) you may notice spotting of blood in your stool or on the toilet paper. If you underwent a bowel prep for your procedure, you may not have a normal bowel movement for a few days.  Please Note:  You might notice some irritation and congestion in your nose or some drainage.  This is from the oxygen used during your procedure.  There is no need for concern and it should clear up in a day or so.  SYMPTOMS TO REPORT IMMEDIATELY:   Following lower endoscopy (colonoscopy or flexible sigmoidoscopy):  Excessive amounts of blood in the stool  Significant tenderness or worsening of abdominal pains  Swelling of the abdomen that is new, acute  Fever of 100F or higher   For urgent or emergent issues, a gastroenterologist can be reached at any hour by calling [phone].   DIET: Your first meal following the procedure should be a small meal and then it is ok to progress to your normal diet. Heavy or fried foods are harder to digest and may make you feel nauseous or bloated.  Likewise, meals heavy in dairy and vegetables can increase bloating.  Drink plenty of fluids but you should avoid alcoholic beverages for 24  hours.  ACTIVITY:  You should plan to take it easy for the rest of today and you should NOT DRIVE or use heavy machinery until tomorrow (because of the sedation medicines used during the test).    FOLLOW UP: Our staff will call the number listed on your records the next business day following your procedure to check on you and address any questions or concerns that you may have regarding the information given to you following your procedure. If we do not reach you, we will leave a message.  However, if you are feeling well and you are not experiencing any problems, there is no need to return our call.  We will assume that you have returned to your regular daily activities without incident.  If any biopsies were taken you will be contacted by phone or by letter within the next 1-3 weeks.  Please call us at [phone] if you have not heard about the biopsies in 3 weeks.    SIGNATURES/CONFIDENTIALITY: You and/or your care partner have signed paperwork which will be entered into your electronic medical record.  These signatures attest to the fact that that the information above on your After Visit Summary has been reviewed and is understood.  Full responsibility of the confidentiality of this discharge information lies with you and/or your care-partner.  Colonoscopy/ Diverticulosis  Await pathology report Repeat Colonoscopy in 5 years

## 2015-06-24 NOTE — Progress Notes (Signed)
Called to room to assist during endoscopic procedure.  Patient ID and intended procedure confirmed with present staff. Received instructions for my participation in the procedure from the performing physician.  

## 2015-06-24 NOTE — Op Note (Signed)
[facility]  [address] [place], [address]   COLONOSCOPY PROCEDURE REPORT PATIENT: Brian [name], Brian [name]  MR#: [id number] BIRTHDATE: September 03, 1946 , 68  yrs. old GENDER: male ENDOSCOPIST: Ladene Artist, MD, [facility] PROCEDURE DATE:  06/24/2015 PROCEDURE:   Colonoscopy, surveillance and Colonoscopy with biopsy First Screening Colonoscopy - Avg.  risk and is 50 yrs.  old or older - No.  Prior Negative Screening - Now for repeat screening. N/A  History of Adenoma - Now for follow-up colonoscopy & has been > or = to 3 yrs.  Yes hx of adenoma.  Has been 3 or more years since last colonoscopy.  Polyps removed today? Yes ASA CLASS:   Class II INDICATIONS:Surveillance due to prior colonic neoplasia, PH Colon Adenoma, and FH Colon Adenoma. MEDICATIONS: Monitored anesthesia care and Propofol 200 mg IV DESCRIPTION OF PROCEDURE:   After the risks benefits and alternatives of the procedure were thoroughly explained, informed consent was obtained.  The digital rectal exam revealed no abnormalities of the rectum.   The LB [id number]  endoscope was introduced through the anus and advanced to the cecum, which was identified by both the appendix and ileocecal valve. No adverse events experienced.   The quality of the prep was excellent. (Suprep was used)  The instrument was then slowly withdrawn as the colon was fully examined. Estimated blood loss is zero unless otherwise noted in this procedure report.    COLON FINDINGS: A sessile polyp measuring 5 mm in size was found in the ascending colon.  A polypectomy was performed with cold forceps.  The resection was complete, the polyp tissue was completely retrieved and sent to histology.   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   There was moderate diverticulosis noted in the sigmoid colon and descending colon with associated colonic spasm and muscular hypertrophy.   The examination  was otherwise normal. Retroflexed views revealed internal Grade I hemorrhoids. The time to cecum = 6.4 Withdrawal time = 10.3   The scope was withdrawn and the procedure completed. COMPLICATIONS: There were no immediate complications.  ENDOSCOPIC IMPRESSION: 1.   Sessile polyp in the ascending colon; polypectomy performed with cold forceps 2.   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   Moderate diverticulosis noted in the sigmoid colon and descending colon 4.   Grade l internal hemorrhoids  RECOMMENDATIONS: 1.  Await pathology results 2.  High fiber diet with liberal fluid intake. 3.  Repeat Colonoscopy in 5 years.  eSigned:  Ladene Artist, MD, [facility] 06/24/2015 2:17 PM

## 2015-06-25 ENCOUNTER — Telehealth: Payer: Self-pay

## 2015-06-25 NOTE — Telephone Encounter (Signed)
  Follow up Call-  Call back number 06/24/2015  Post procedure Call Back phone  # [phone]  Permission to leave phone message No     Patient questions:  Do you have a fever, pain , or abdominal swelling? No. Pain Score  0 *  Have you tolerated food without any problems? Yes.    Have you been able to return to your normal activities? Yes.    Do you have any questions about your discharge instructions: Diet   No. Medications  No. Follow up visit  No.  Do you have questions or concerns about your Care? No.  Actions: * If pain score is 4 or above: No action needed, pain <4.   No problems per the pt. maw

## 2015-07-06 ENCOUNTER — Encounter: Payer: Self-pay | Admitting: Gastroenterology

## 2015-07-22 ENCOUNTER — Telehealth: Payer: Self-pay | Admitting: Emergency Medicine

## 2015-07-22 NOTE — Telephone Encounter (Signed)
LVM for pt to call back to inform him of [place] note.   To comply with [place] data request he needs to see me with BP readings & his cuff.  Minimal Blood Pressure Goal= AVERAGE < 140/90; Ideal is an AVERAGE < 135/85. This AVERAGE should be calculated from @ least 5-7 BP readings taken @ different times of day on different days of week. You should not respond to isolated BP readings , but rather the AVERAGE for that week .Please bring your blood pressure cuff to office visits to verify that it is reliable.It can also be checked against the blood pressure device at the pharmacy. Finger or wrist cuffs are not dependable; an arm cuff is.   Pt needs OV to discuss BP readings and verify his home cuff is reliable.

## 2015-07-31 ENCOUNTER — Telehealth: Payer: Self-pay | Admitting: Internal Medicine

## 2015-07-31 NOTE — Telephone Encounter (Signed)
Patient is returning your call.  

## 2015-07-31 NOTE — Telephone Encounter (Signed)
Spoke with pt about BP reading. Informed pt to record BPs up until appt time. Appt is scheduled for Aug 22nd.

## 2015-08-18 ENCOUNTER — Other Ambulatory Visit (INDEPENDENT_AMBULATORY_CARE_PROVIDER_SITE_OTHER): Payer: Medicare Other

## 2015-08-18 ENCOUNTER — Encounter: Payer: Self-pay | Admitting: Internal Medicine

## 2015-08-18 ENCOUNTER — Ambulatory Visit (INDEPENDENT_AMBULATORY_CARE_PROVIDER_SITE_OTHER): Payer: Medicare Other | Admitting: Internal Medicine

## 2015-08-18 VITALS — BP 130/80 | HR 58 | Temp 97.8°F | Resp 16 | Wt 157.0 lb

## 2015-08-18 DIAGNOSIS — R030 Elevated blood-pressure reading, without diagnosis of hypertension: Secondary | ICD-10-CM | POA: Diagnosis not present

## 2015-08-18 LAB — BASIC METABOLIC PANEL
BUN: 13 mg/dL (ref 6–23)
CHLORIDE: 105 meq/L (ref 96–112)
CO2: 26 meq/L (ref 19–32)
CREATININE: 0.91 mg/dL (ref 0.40–1.50)
Calcium: 9.5 mg/dL (ref 8.4–10.5)
GFR: 88.3 mL/min (ref >60.00)
Glucose, Bld: 88 mg/dL (ref 70–99)
Potassium: 4.1 mEq/L (ref 3.5–5.1)
SODIUM: 140 meq/L (ref 135–145)

## 2015-08-18 NOTE — Progress Notes (Signed)
   Subjective:    Patient ID: Brian [name], male    DOB: [birth year], 69 y.o.   MRN: [id number]  HPI He is on no blood pressure medicines at this time. Blood pressure ranges 120/67-139/92 with an average of 130/79.  He is on a heart healthy diet. He exercises in the gym for 60 minutes once a week and for 15 minutes 4 mornings a week without associated cardiopulmonary symptoms.     Review of Systems   Chest pain, palpitations, tachycardia, exertional dyspnea, paroxysmal nocturnal dyspnea, claudication or edema are absent.        Objective:   Physical Exam  General appearance :adequately nourished; in no distress.  Eyes: No conjunctival inflammation or scleral icterus is present.  Heart:  Normal rate and regular rhythm. S1 and S2 normal without gallop, murmur, click, rub or other extra sounds    Lungs:Chest clear to auscultation; no wheezes, rhonchi,rales ,or rubs present.No increased work of breathing.   Abdomen: bowel sounds normal, soft and non-tender without masses, organomegaly or hernias noted.  No guarding or rebound. No flank tenderness to percussion.  Vascular : all pulses equal ; no bruits present.  Skin:Warm & dry.  Intact without suspicious lesions or rashes ; no tenting or jaundice   Lymphatic: No lymphadenopathy is noted about the head, neck, axilla, or inguinal areas.   Neuro: Strength, tone & DTRs normal.       Assessment & Plan:  #1 elevated blood pressure without diagnosis hypertension. At this time blood pressure appears to be well controlled.  Plan: see after visit summary

## 2015-08-18 NOTE — Patient Instructions (Signed)

## 2015-08-18 NOTE — Progress Notes (Signed)
Pre visit review using our clinic review tool, if applicable. No additional management support is needed unless otherwise documented below in the visit note. 

## 2015-11-12 ENCOUNTER — Telehealth: Payer: Self-pay | Admitting: Internal Medicine

## 2015-11-12 ENCOUNTER — Other Ambulatory Visit: Payer: Self-pay | Admitting: Internal Medicine

## 2015-11-12 DIAGNOSIS — Z860100 Personal history of colon polyps, unspecified: Secondary | ICD-10-CM

## 2015-11-12 DIAGNOSIS — E786 Lipoprotein deficiency: Secondary | ICD-10-CM

## 2015-11-12 DIAGNOSIS — R030 Elevated blood-pressure reading, without diagnosis of hypertension: Secondary | ICD-10-CM

## 2015-11-12 DIAGNOSIS — K5730 Diverticulosis of large intestine without perforation or abscess without bleeding: Secondary | ICD-10-CM

## 2015-11-12 NOTE — Telephone Encounter (Signed)
done

## 2015-11-12 NOTE — Telephone Encounter (Signed)
Patient has a CPE on 11/23 at 4pm. Currently no lab orders entered. Since it is up against thanksgiving, requesting that lab orders be placed for testing before hand,.

## 2015-11-17 ENCOUNTER — Encounter: Payer: Self-pay | Admitting: Internal Medicine

## 2015-11-18 ENCOUNTER — Encounter: Payer: Medicare Other | Admitting: Internal Medicine

## 2015-11-19 ENCOUNTER — Encounter: Payer: Self-pay | Admitting: Internal Medicine

## 2015-11-19 ENCOUNTER — Ambulatory Visit (INDEPENDENT_AMBULATORY_CARE_PROVIDER_SITE_OTHER): Payer: Medicare Other | Admitting: Internal Medicine

## 2015-11-19 ENCOUNTER — Other Ambulatory Visit (INDEPENDENT_AMBULATORY_CARE_PROVIDER_SITE_OTHER): Payer: Medicare Other

## 2015-11-19 VITALS — BP 148/90 | HR 59 | Temp 97.8°F | Ht 68.0 in | Wt 157.5 lb

## 2015-11-19 DIAGNOSIS — Z1159 Encounter for screening for other viral diseases: Secondary | ICD-10-CM

## 2015-11-19 DIAGNOSIS — E786 Lipoprotein deficiency: Secondary | ICD-10-CM

## 2015-11-19 DIAGNOSIS — K5730 Diverticulosis of large intestine without perforation or abscess without bleeding: Secondary | ICD-10-CM | POA: Diagnosis not present

## 2015-11-19 DIAGNOSIS — Z86010 Personal history of colonic polyps: Secondary | ICD-10-CM

## 2015-11-19 DIAGNOSIS — R030 Elevated blood-pressure reading, without diagnosis of hypertension: Secondary | ICD-10-CM

## 2015-11-19 DIAGNOSIS — H8110 Benign paroxysmal vertigo, unspecified ear: Secondary | ICD-10-CM

## 2015-11-19 LAB — CBC WITH DIFFERENTIAL/PLATELET
BASOS PCT: 0.7 % (ref 0.0–3.0)
Basophils Absolute: 0 10*3/uL (ref 0.0–0.1)
EOS PCT: 3.2 % (ref 0.0–5.0)
Eosinophils Absolute: 0.2 10*3/uL (ref 0.0–0.7)
HCT: 45.2 % (ref 39.0–52.0)
Hemoglobin: 15 g/dL (ref 13.0–17.0)
LYMPHS ABS: 1.6 10*3/uL (ref 0.7–4.0)
Lymphocytes Relative: 30.5 % (ref 12.0–46.0)
MCHC: 33.2 g/dL (ref 30.0–36.0)
MCV: 88 fl (ref 78.0–100.0)
MONO ABS: 0.5 10*3/uL (ref 0.1–1.0)
Monocytes Relative: 8.6 % (ref 3.0–12.0)
NEUTROS ABS: 3 10*3/uL (ref 1.4–7.7)
NEUTROS PCT: 57 % (ref 43.0–77.0)
Platelets: 216 10*3/uL (ref 150.0–400.0)
RBC: 5.14 Mil/uL (ref 4.22–5.81)
RDW: 13.1 % (ref 11.5–15.5)
WBC: 5.3 10*3/uL (ref 4.0–10.5)

## 2015-11-19 LAB — TSH: TSH: 4.24 u[IU]/mL (ref 0.35–4.50)

## 2015-11-19 LAB — BASIC METABOLIC PANEL
BUN: 16 mg/dL (ref 6–23)
CALCIUM: 9.4 mg/dL (ref 8.4–10.5)
CO2: 27 mEq/L (ref 19–32)
Chloride: 108 mEq/L (ref 96–112)
Creatinine, Ser: 0.88 mg/dL (ref 0.40–1.50)
GFR: 91.71 mL/min (ref >60.00)
GLUCOSE: 87 mg/dL (ref 70–99)
Potassium: 4.6 mEq/L (ref 3.5–5.1)
SODIUM: 143 meq/L (ref 135–145)

## 2015-11-19 LAB — LIPID PANEL
CHOLESTEROL: 148 mg/dL (ref 0–200)
HDL: 41.5 mg/dL (ref >39.00)
LDL CALC: 95 mg/dL (ref 0–99)
NonHDL: 106.97
TRIGLYCERIDES: 61 mg/dL (ref 0.0–149.0)
Total CHOL/HDL Ratio: 4
VLDL: 12.2 mg/dL (ref 0.0–40.0)

## 2015-11-19 LAB — HEPATIC FUNCTION PANEL
ALBUMIN: 4.3 g/dL (ref 3.5–5.2)
ALT: 17 U/L (ref 0–53)
AST: 20 U/L (ref 0–37)
Alkaline Phosphatase: 78 U/L (ref 39–117)
BILIRUBIN TOTAL: 0.9 mg/dL (ref 0.2–1.2)
Bilirubin, Direct: 0.2 mg/dL (ref 0.0–0.3)
Total Protein: 6.8 g/dL (ref 6.0–8.3)

## 2015-11-19 NOTE — Progress Notes (Signed)
   Subjective:    Patient ID: RENDELL RABB, male    DOB: 1946/08/28, 69 y.o.   MRN: [id number]  HPI The patient is here to assess status of active health conditions.  PMH, FH, & Social History reviewed & updated.No change in [place] as recorded.  He denies any adverse effects with any of his medications. He is on a heart healthy, no added salt diet. 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 Dr. Jeffie Pollock monitors his PSA. It remains less than 1.  Occasionally he'll have symptoms of benign positional vertigo when he lies back on a rolling cart working under his automobile.  Review of Systems  Chest pain, palpitations, tachycardia, exertional dyspnea, paroxysmal nocturnal dyspnea, claudication or edema are absent. No unexplained weight loss, abdominal pain, significant dyspepsia, dysphagia, melena, rectal bleeding, or persistently small caliber stools. Dysuria, pyuria, hematuria, frequency, or polyuria are denied. Change in hair, skin, nails denied. No bowel changes of constipation or diarrhea. No intolerance to heat or cold.     Objective:   Physical Exam  Pertinent or positive findings include: Ptosis OD > OS.Heart rate slow; S2 accentuated.Slight crepitus knees. Knee DTRs 0-1/2+. GU as per Dr Jeffie Pollock.  General appearance :adequately nourished; in no distress.  Eyes: No conjunctival inflammation or scleral icterus is present.  Oral exam:  Lips and gums are healthy appearing.There is no oropharyngeal erythema or exudate noted. Dental hygiene is good.  Heart:  regular rhythm. S1 normal without gallop, murmur, click, rub or other extra sounds    Lungs:Chest clear to auscultation; no wheezes, rhonchi,rales ,or rubs present.No increased work of breathing.   Abdomen: bowel sounds normal, soft and non-tender without masses,  organomegaly or hernias noted.  No guarding or rebound. No flank tenderness to percussion.  Vascular : all pulses equal ; no bruits present.  Skin:Warm & dry.  Intact without suspicious lesions or rashes ; no tenting or jaundice   Lymphatic: No lymphadenopathy is noted about the head, neck, axilla.   Neuro: Strength, tone  normal.     Assessment & Plan:  #1 See Current Assessment & Plan in Problem List under specific Diagnosis #2 BPV;see AVS

## 2015-11-19 NOTE — Patient Instructions (Addendum)
Go to Web M[employer]D. for information on benign positional vertigo (BPV) . Physical therapy exercises can treat that if this progresses.

## 2015-11-19 NOTE — Progress Notes (Signed)
Pre visit review using our clinic review tool, if applicable. No additional management support is needed unless otherwise documented below in the visit note. 

## 2015-11-20 LAB — HEPATITIS C ANTIBODY: HCV Ab: NEGATIVE

## 2015-11-20 NOTE — Assessment & Plan Note (Addendum)
Lipid panel reviewed & discussed

## 2015-11-20 NOTE — Assessment & Plan Note (Signed)
Blood pressure goals reviewed. BMET reviewed 

## 2015-11-20 NOTE — Assessment & Plan Note (Signed)
Go to Web M.D. for information on benign positional vertigo (BPV) . Physical therapy referral if progressive.

## 2015-11-20 NOTE — Assessment & Plan Note (Signed)
CBC

## 2016-09-07 ENCOUNTER — Encounter: Payer: Self-pay | Admitting: Internal Medicine

## 2016-09-15 ENCOUNTER — Telehealth: Payer: Self-pay | Admitting: Family Medicine

## 2016-09-15 NOTE — Telephone Encounter (Signed)
No  Go to [email or web address]  Or [employer] website for information

## 2016-09-15 NOTE — Telephone Encounter (Signed)
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 Stated this is a change that was made in May of this year. Please call pt to follow up. Thanks.

## 2016-11-23 ENCOUNTER — Other Ambulatory Visit (INDEPENDENT_AMBULATORY_CARE_PROVIDER_SITE_OTHER): Payer: Medicare Other

## 2016-11-23 ENCOUNTER — Encounter: Payer: Self-pay | Admitting: Internal Medicine

## 2016-11-23 ENCOUNTER — Ambulatory Visit (INDEPENDENT_AMBULATORY_CARE_PROVIDER_SITE_OTHER): Payer: Medicare Other | Admitting: Internal Medicine

## 2016-11-23 VITALS — BP 144/84 | HR 57 | Temp 97.8°F | Resp 16 | Ht 68.0 in | Wt 157.0 lb

## 2016-11-23 DIAGNOSIS — Z0000 Encounter for general adult medical examination without abnormal findings: Secondary | ICD-10-CM

## 2016-11-23 DIAGNOSIS — J3089 Other allergic rhinitis: Secondary | ICD-10-CM

## 2016-11-23 DIAGNOSIS — Z8546 Personal history of malignant neoplasm of prostate: Secondary | ICD-10-CM

## 2016-11-23 DIAGNOSIS — R030 Elevated blood-pressure reading, without diagnosis of hypertension: Secondary | ICD-10-CM | POA: Diagnosis not present

## 2016-11-23 DIAGNOSIS — J309 Allergic rhinitis, unspecified: Secondary | ICD-10-CM | POA: Insufficient documentation

## 2016-11-23 DIAGNOSIS — H8110 Benign paroxysmal vertigo, unspecified ear: Secondary | ICD-10-CM

## 2016-11-23 DIAGNOSIS — R51 Headache: Secondary | ICD-10-CM | POA: Diagnosis not present

## 2016-11-23 DIAGNOSIS — R519 Headache, unspecified: Secondary | ICD-10-CM

## 2016-11-23 LAB — CBC WITH DIFFERENTIAL/PLATELET
BASOS ABS: 0.1 10*3/uL (ref 0.0–0.1)
Basophils Relative: 0.8 % (ref 0.0–3.0)
EOS ABS: 0.2 10*3/uL (ref 0.0–0.7)
Eosinophils Relative: 2.4 % (ref 0.0–5.0)
HEMATOCRIT: 45.2 % (ref 39.0–52.0)
Hemoglobin: 15.4 g/dL (ref 13.0–17.0)
LYMPHS ABS: 1.6 10*3/uL (ref 0.7–4.0)
LYMPHS PCT: 22 % (ref 12.0–46.0)
MCHC: 34 g/dL (ref 30.0–36.0)
MCV: 86.7 fl (ref 78.0–100.0)
MONOS PCT: 9.2 % (ref 3.0–12.0)
Monocytes Absolute: 0.7 10*3/uL (ref 0.1–1.0)
NEUTROS PCT: 65.6 % (ref 43.0–77.0)
Neutro Abs: 4.7 10*3/uL (ref 1.4–7.7)
Platelets: 233 10*3/uL (ref 150.0–400.0)
RBC: 5.22 Mil/uL (ref 4.22–5.81)
RDW: 13.1 % (ref 11.5–15.5)
WBC: 7.2 10*3/uL (ref 4.0–10.5)

## 2016-11-23 LAB — LIPID PANEL
CHOL/HDL RATIO: 4
Cholesterol: 160 mg/dL (ref 0–200)
HDL: 38.2 mg/dL — AB (ref >39.00)
LDL Cholesterol: 102 mg/dL — ABNORMAL HIGH (ref 0–99)
NonHDL: 121.31
Triglycerides: 97 mg/dL (ref 0.0–149.0)
VLDL: 19.4 mg/dL (ref 0.0–40.0)

## 2016-11-23 LAB — COMPREHENSIVE METABOLIC PANEL
ALK PHOS: 78 U/L (ref 39–117)
ALT: 16 U/L (ref 0–53)
AST: 17 U/L (ref 0–37)
Albumin: 4.5 g/dL (ref 3.5–5.2)
BILIRUBIN TOTAL: 1 mg/dL (ref 0.2–1.2)
BUN: 14 mg/dL (ref 6–23)
CO2: 28 meq/L (ref 19–32)
CREATININE: 0.87 mg/dL (ref 0.40–1.50)
Calcium: 9.4 mg/dL (ref 8.4–10.5)
Chloride: 106 mEq/L (ref 96–112)
GFR: 92.65 mL/min (ref >60.00)
GLUCOSE: 87 mg/dL (ref 70–99)
Potassium: 4.3 mEq/L (ref 3.5–5.1)
Sodium: 141 mEq/L (ref 135–145)
TOTAL PROTEIN: 6.9 g/dL (ref 6.0–8.3)

## 2016-11-23 LAB — TSH: TSH: 3.06 u[IU]/mL (ref 0.35–4.50)

## 2016-11-23 NOTE — Assessment & Plan Note (Signed)
Gets allergy injections Has epi-pen for food allergies Uses flonase prn

## 2016-11-23 NOTE — Assessment & Plan Note (Addendum)
BP average at home is 129/79.3- well controlled Continue to monitor at home  EKG today cmp

## 2016-11-23 NOTE — Assessment & Plan Note (Addendum)
1/4 imitrex about 2/week Follows with Dr Domingo Cocking

## 2016-11-23 NOTE — Progress Notes (Signed)
Subjective:    Patient ID: Brian [name], male    DOB: [birth year], 70 y.o.   MRN: [id number]  HPI Here for medicare wellness exam and annual physical exam.   I have personally reviewed and have noted 1.The patient's medical and social history 2.Their use of alcohol, tobacco or illicit drugs 3.Their current medications and supplements 4.The patient's functional ability including ADL's, fall risks, home safety risks and                 hearing or visual impairment. 5.Diet and physical activities 6.Evidence for depression or mood disorders 7.Care team reviewed - urology: Dr Jeffie Pollock, Dr Domingo Cocking Sarita Haver, Dr Fredderick Phenix - allergies   Are there smokers in your home (other than you)? No  Risk Factors Exercise: regular exercise - goes to gym and goes to a trainer once a week Dietary issues discussed:  Cutting back on sweets, overall healthy, balanced  Cardiac risk factors: advanced age, hypertension, hyperlipidemia  Depression Screen  Have you felt down, depressed or hopeless? No  Have you felt little interest or pleasure in doing things?  No  Activities of Daily Living In your present state of health, do you have any difficulty performing the following activities?:  Driving? No Managing money?  No Feeding yourself? No Getting from bed to chair? No Climbing a flight of stairs? No Preparing food and eating?: No Bathing or showering? No Getting dressed: No Getting to/using the toilet? No Moving around from place to place: No In the past year have you fallen or had a near fall?: No   Are you sexually active?  yes  Do you have more than one partner?  No  Hearing Difficulties: No Do you often ask people to speak up or repeat themselves? No Do you experience ringing or noises in your ears? Yes, rare Do you have difficulty understanding soft or whispered voices? No Vision:              Any change in vision:  no          Up to date with eye exam:  yes Memory:  Do you feel that you have a problem with memory? No  Do you often misplace items? No  Do you feel safe at home?  Yes  Cognitive Testing  Alert, Orientated? Yes  Normal Appearance? Yes  Recall of three objects?  Yes  Can perform simple calculations? Yes  Displays appropriate judgment? Yes  Can read the correct time from a watch face? Yes   Advanced Directives have been discussed with the patient? Yes - in place   Medications and allergies reviewed with patient and updated if appropriate.  Patient Active Problem List   Diagnosis Date Noted  . BPV (benign positional vertigo) 11/19/2015  . Multiple food allergies   . 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 Malignant neoplasm of prostate ([facility]) 03/20/2012  . Diverticulosis of large intestine 12/02/2008  . History of colonic polyps 12/02/2008  . Low HDL (under 40) 02/12/2008  . Elevated blood pressure reading without diagnosis of hypertension 02/12/2008  . Headache(784.0) 02/12/2008    Current Outpatient Prescriptions on File Prior to Visit  Medication Sig Dispense Refill  . EPIPEN 2-PAK 0.3 MG/0.3ML SOAJ injection Inject 1 mg into the muscle as needed.    . fish oil-omega-3 fatty acids 1000 MG capsule Take 1 g by  mouth daily. Takes 500 mg daily    . fluticasone (FLONASE) 50 MCG/ACT nasal spray Place 1 spray into both nostrils 2 (two) times daily as needed for rhinitis. 16 g 5  . glucosamine-chondroitin 500-400 MG tablet Take 1 tablet by mouth daily.     . Multiple Vitamins-Minerals (MACUVITE PO) Take by mouth.    Marland Kitchen PRESCRIPTION MEDICATION Inject into the muscle every 30 (thirty) days. ALLERGY INJECTION'S MONTHLY    . SUMAtriptan (IMITREX) 100 MG tablet Take 100 mg by mouth every 2 (two) hours as needed.    . vardenafil (LEVITRA) 20 MG tablet Take 1 tablet (20 mg total) by  mouth daily as needed. Takes 5 mg daily (cuts tablet in 1/4) 10 tablet 2  . vitamin C (ASCORBIC ACID) 500 MG tablet Take 500 mg by mouth daily.      No current facility-administered medications on file prior to visit.     Past Medical History:  Diagnosis Date  . Arthritis LOW BACK AND KNEES  . Hypertension   . Migraine headache    Dr Domingo Cocking  . Multiple food allergies   . Nocturia   . Prostate cancer ([facility]) 06/14/12   Gleason 3+3=6, volume 33.84 cc  . Seasonal allergies    on immunotherapy    Past Surgical History:  Procedure Laterality Date  . BIOPSY PROSTATE  06/14/2012   [facility]  . COLONOSCOPY  05/29/2010    "A sessile polyp was found in the ascending colon.  It was 5 mm in size.   A sessile polyp was found the the distal transverse colon.  It was 3 mm in size.   Moderate diverticulosis was found in the sigmoid colon."   . CYSTOSCOPY  10/26/2012   Procedure: CYSTOSCOPY FLEXIBLE;  Surgeon: Malka So, MD;  Location: [facility];  Service: Urology;  Laterality: N/A;  . HEMORRHOID SURGERY  05-19-2006   [place]  . POLYPECTOMY    . RADIOACTIVE SEED IMPLANT  10/26/2012   Procedure: RADIOACTIVE SEED IMPLANT;  Surgeon: Malka So, MD;  Location: [facility];  Service: Urology;  Laterality: N/A;  . TONSILLECTOMY  1935   70 years old  . WRIST ARTHROSCOPY  03-27-2003   DEBRIDEMENT VOLAR ULNAR  & SCAPHOLUNATE LIGAMENT TEARS OF RIGHT WRIST    Social History   Social History  . Marital status: Married    Spouse name: N/A  . Number of children: N/A  . Years of education: N/A   Occupational History  . OWNER [employer]   Social History Main Topics  . Smoking status: Never Smoker  . Smokeless tobacco: Never Used  . Alcohol use No  . Drug use: No  . Sexual activity: Yes   Other Topics Concern  . Not on file   Social History Narrative   Married over 54 years      2 daughters      03/20/12 PSA 4.03     Family History  Problem Relation Age of Onset  . Diabetes Mother   . Hypertension Mother   . Hypertension Father   . Heart failure Father   . Colon polyps Father   . Hypertension Maternal Grandmother   . Diabetes Maternal Grandmother   . Stroke Maternal Grandfather     > 55  . Bone cancer Paternal Grandfather   . Colon cancer Neg Hx   . Esophageal cancer Neg Hx   . Rectal cancer Neg Hx   .  Stomach cancer Neg Hx     Review of Systems  Constitutional: Negative for appetite change, chills, fatigue, fever and unexpected weight change.  HENT: Negative for hearing loss and tinnitus.   Eyes: Negative for visual disturbance.  Respiratory: Negative for cough, shortness of breath and wheezing.   Cardiovascular: Negative for chest pain, palpitations and leg swelling.  Gastrointestinal: Negative for abdominal pain, blood in stool, constipation, diarrhea and nausea.       No gerd  Genitourinary: Negative for dysuria and hematuria.  Musculoskeletal: Positive for back pain (lower back intermittent). Negative for arthralgias.  Skin: Negative for color change and rash.  Neurological: Positive for dizziness (occ) and headaches. Negative for light-headedness.  Psychiatric/Behavioral: Negative for dysphoric mood. The patient is not nervous/anxious.        Objective:   Vitals:   11/23/16 0806  BP: (!) 144/84  Pulse: (!) 57  Resp: 16  Temp: 97.8 F (36.6 C)   Filed Weights   11/23/16 0806  Weight: 157 lb (71.2 kg)   Body mass index is 23.87 kg/m.   Physical Exam Constitutional: He appears well-developed and well-nourished. No distress.  HENT:  Head: Normocephalic and atraumatic.  Right Ear: External ear normal.  Left Ear: External ear normal.  Mouth/Throat: Oropharynx is clear and moist.  Normal ear canals and TM b/l  Eyes: Conjunctivae and EOM are normal.  Neck: Neck supple. No tracheal deviation present. No thyromegaly present.  No carotid bruit  Cardiovascular: Normal  rate, regular rhythm, normal heart sounds and intact distal pulses.   No murmur heard. Pulmonary/Chest: Effort normal and breath sounds normal. No respiratory distress. He has no wheezes. He has no rales.  Abdominal: Soft. Bowel sounds are normal. He exhibits no distension. There is no tenderness.  Genitourinary:  deferred to urology Musculoskeletal: He exhibits no edema.  Lymphadenopathy:   He has no cervical adenopathy.  Skin: Skin is warm and dry. He is not diaphoretic.  Psychiatric: He has a normal mood and affect. His behavior is normal.         Assessment & Plan:   Wellness Exam: Immunizations flu vaccine today, others up to date Colonoscopy  Up to date  Eye exam  Up to date  Hearing loss  none Memory concerns/difficulties  none Independent of ADLs  - fully Stressed the importance of regular exercise, which he does   Patient received copy of preventative screening tests/immunizations recommended for the next 5-10 years.    Physical exam: Screening blood work  ordered Immunizations flu vaccine today, others up to date Colonoscopy  Up to date  Eye exams  Up to date  EKG - today due to elevated BP here - controlled at home, last EKG normal in 2013 Exercise - regular Weight - normal BMI Skin  - no concerns Substance abuse  none  See Problem List for Assessment and Plan of chronic medical problems.   F/u annually - CPE/wellness

## 2016-11-23 NOTE — Assessment & Plan Note (Signed)
Follows with Dr Jeffie Pollock No evidence of recurrence

## 2016-11-23 NOTE — Progress Notes (Signed)
Pre visit review using our clinic review tool, if applicable. No additional management support is needed unless otherwise documented below in the visit note. 

## 2016-11-23 NOTE — Patient Instructions (Addendum)
Brian [name] , Thank you for taking time to come for your Medicare Wellness Visit. I appreciate your ongoing commitment to your health goals. Please review the following plan we discussed and let me know if I can assist you in the future.   These are the goals we discussed: Goals    None      This is a list of the screening recommended for you and due dates:  Health Maintenance  Topic Date Due  . Flu Shot  07/27/2016  . Colon Cancer Screening  06/23/2020  . Tetanus Vaccine  09/30/2021  . Shingles Vaccine  Completed  .  Hepatitis C: One time screening is recommended by Center for Disease Control  (CDC) for  adults born from [birth year] through 1963.   Completed  . Pneumonia vaccines  Completed     Test(s) ordered today. Your results will be released to [facility] (or called to you) after review, usually within 72hours after test completion. If any changes need to be made, you will be notified at that same time.  All other Health Maintenance issues reviewed.   All recommended immunizations and age-appropriate screenings are up-to-date or discussed.  No immunizations administered today.   Medications reviewed and updated.   No changes recommended at this time.   Please followup in one year for a physical   Health Maintenance, Male A healthy lifestyle and preventative care can promote health and wellness.  Maintain regular health, dental, and eye exams.  Eat a healthy diet. Foods like vegetables, fruits, whole grains, low-fat dairy products, and lean protein foods contain the nutrients you need and are low in calories. Decrease your intake of foods high in solid fats, added sugars, and salt. Get information about a proper diet from your health care provider, if necessary.  Regular physical exercise is one of the most important things you can do for your health. Most adults should get at least 150 minutes of moderate-intensity exercise (any activity that increases your heart rate and causes  you to sweat) each week. In addition, most adults need muscle-strengthening exercises on 2 or more days a week.   Maintain a healthy weight. The body mass index (BMI) is a screening tool to identify possible weight problems. It provides an estimate of body fat based on height and weight. Your health care provider can find your BMI and can help you achieve or maintain a healthy weight. For males 20 years and older:  A BMI below 18.5 is considered underweight.  A BMI of 18.5 to 24.9 is normal.  A BMI of 25 to 29.9 is considered overweight.  A BMI of 30 and above is considered obese.  Maintain normal blood lipids and cholesterol by exercising and minimizing your intake of saturated fat. Eat a balanced diet with plenty of fruits and vegetables. Blood tests for lipids and cholesterol should begin at age 68 and be repeated every 5 years. If your lipid or cholesterol levels are high, you are over age 42, or you are at high risk for heart disease, you may need your cholesterol levels checked more frequently.Ongoing high lipid and cholesterol levels should be treated with medicines if diet and exercise are not working.  If you smoke, find out from your health care provider how to quit. If you do not use tobacco, do not start.  Lung cancer screening is recommended for adults aged 62-80 years who are at high risk for developing lung cancer because of a history of smoking. A yearly low-dose  CT scan of the lungs is recommended for people who have at least a 30-pack-year history of smoking and are current smokers or have quit within the past 15 years. A pack year of smoking is smoking an average of 1 pack of cigarettes a day for 1 year (for example, a 30-pack-year history of smoking could mean smoking 1 pack a day for 30 years or 2 packs a day for 15 years). Yearly screening should continue until the smoker has stopped smoking for at least 15 years. Yearly screening should be stopped for people who develop a  health problem that would prevent them from having lung cancer treatment.  If you choose to drink alcohol, do not have more than 2 drinks per day. One drink is considered to be 12 oz (360 mL) of beer, 5 oz (150 mL) of wine, or 1.5 oz (45 mL) of liquor.  Avoid the use of street drugs. Do not share needles with anyone. Ask for help if you need support or instructions about stopping the use of drugs.  High blood pressure causes heart disease and increases the risk of stroke. High blood pressure is more likely to develop in:  People who have blood pressure in the end of the normal range (100-139/85-89 mm Hg).  People who are overweight or obese.  People who are African American.  If you are 28-82 years of age, have your blood pressure checked every 3-5 years. If you are 66 years of age or older, have your blood pressure checked every year. You should have your blood pressure measured twice-once when you are at a hospital or clinic, and once when you are not at a hospital or clinic. Record the average of the two measurements. To check your blood pressure when you are not at a hospital or clinic, you can use:  An automated blood pressure machine at a pharmacy.  A home blood pressure monitor.  If you are 27-21 years old, ask your health care provider if you should take aspirin to prevent heart disease.  Diabetes screening involves taking a blood sample to check your fasting blood sugar level. This should be done once every 3 years after age 21 if you are at a normal weight and without risk factors for diabetes. Testing should be considered at a younger age or be carried out more frequently if you are overweight and have at least 1 risk factor for diabetes.  Colorectal cancer can be detected and often prevented. Most routine colorectal cancer screening begins at the age of 36 and continues through age 38. However, your health care provider may recommend screening at an earlier age if you have risk  factors for colon cancer. On a yearly basis, your health care provider may provide home test kits to check for hidden blood in the stool. A small camera at the end of a tube may be used to directly examine the colon (sigmoidoscopy or colonoscopy) to detect the earliest forms of colorectal cancer. Talk to your health care provider about this at age 24 when routine screening begins. A direct exam of the colon should be repeated every 5-10 years through age 109, unless early forms of precancerous polyps or small growths are found.  People who are at an increased risk for hepatitis B should be screened for this virus. You are considered at high risk for hepatitis B if:  You were born in a country where hepatitis B occurs often. Talk with your health care provider about which countries  are considered high risk.  Your parents were born in a high-risk country and you have not received a shot to protect against hepatitis B (hepatitis B vaccine).  You have HIV or AIDS.  You use needles to inject street drugs.  You live with, or have sex with, someone who has hepatitis B.  You are a man who has sex with other men (MSM).  You get hemodialysis treatment.  You take certain medicines for conditions like cancer, organ transplantation, and autoimmune conditions.  Hepatitis C blood testing is recommended for all people born from [birth year] through 1965 and any individual with known risk factors for hepatitis C.  Healthy men should no longer receive prostate-specific antigen (PSA) blood tests as part of routine cancer screening. Talk to your health care provider about prostate cancer screening.  Testicular cancer screening is not recommended for adolescents or adult males who have no symptoms. Screening includes self-exam, a health care provider exam, and other screening tests. Consult with your health care provider about any symptoms you have or any concerns you have about testicular cancer.  Practice safe sex.  Use condoms and avoid high-risk sexual practices to reduce the spread of sexually transmitted infections (STIs).  You should be screened for STIs, including gonorrhea and chlamydia if:  You are sexually active and are younger than 24 years.  You are older than 24 years, and your health care provider tells you that you are at risk for this type of infection.  Your sexual activity has changed since you were last screened, and you are at an increased risk for chlamydia or gonorrhea. Ask your health care provider if you are at risk.  If you are at risk of being infected with HIV, it is recommended that you take a prescription medicine daily to prevent HIV infection. This is called pre-exposure prophylaxis (PrEP). You are considered at risk if:  You are a man who has sex with other men (MSM).  You are a heterosexual man who is sexually active with multiple partners.  You take drugs by injection.  You are sexually active with a partner who has HIV.  Talk with your health care provider about whether you are at high risk of being infected with HIV. If you choose to begin PrEP, you should first be tested for HIV. You should then be tested every 3 months for as long as you are taking PrEP.  Use sunscreen. Apply sunscreen liberally and repeatedly throughout the day. You should seek shade when your shadow is shorter than you. Protect yourself by wearing long sleeves, pants, a wide-brimmed hat, and sunglasses year round whenever you are outdoors.  Tell your health care provider of new moles or changes in moles, especially if there is a change in shape or color. Also, tell your health care provider if a mole is larger than the size of a pencil eraser.  A one-time screening for abdominal aortic aneurysm (AAA) and surgical repair of large AAAs by ultrasound is recommended for men aged 101-75 years who are current or former smokers.  Stay current with your vaccines (immunizations). This information is not  intended to replace advice given to you by your health care provider. Make sure you discuss any questions you have with your health care provider. Document Released: 06/10/2008 Document Revised: 01/03/2015 Document Reviewed: 09/16/2015 Elsevier Interactive Patient Education  2017 [employer].

## 2016-11-23 NOTE — Assessment & Plan Note (Signed)
Intermittent, mild Does not take medication

## 2017-03-31 ENCOUNTER — Ambulatory Visit (INDEPENDENT_AMBULATORY_CARE_PROVIDER_SITE_OTHER): Payer: Self-pay | Admitting: Family Medicine

## 2017-03-31 VITALS — BP 148/86 | HR 51 | Ht 68.5 in | Wt 158.2 lb

## 2017-03-31 DIAGNOSIS — Z0289 Encounter for other administrative examinations: Secondary | ICD-10-CM

## 2017-03-31 NOTE — Progress Notes (Signed)
BP (!) 148/86   Pulse (!) 51   Ht 5' 8.5" (1.74 m)   Wt 158 lb 3.2 oz (71.8 kg)   BMI 23.70 kg/m    Subjective:    Patient ID: Brian [name], male    DOB: 05-Aug-1946, 71 y.o.   MRN: [id number]  HPI: Brian [name] is a 71 y.o. male  Chief Complaint  Patient presents with  . 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 Forms filled out chart reviewed patient qualified  Relevant past medical, surgical, family and social history reviewed and updated as indicated. Interim medical history since our last visit reviewed. Allergies and medications reviewed and updated.  Review of Systems  Constitutional: Negative.   HENT: Negative.   Eyes: Negative.   Respiratory: Negative.   Cardiovascular: Negative.   Gastrointestinal: Negative.   Endocrine: Negative.   Genitourinary: Negative.   Musculoskeletal: Negative.   Skin: Negative.   Allergic/Immunologic: Negative.   Neurological: Negative.   Hematological: Negative.   Psychiatric/Behavioral: Negative.     Per HPI unless specifically indicated above     Objective:    BP (!) 148/86   Pulse (!) 51   Ht 5' 8.5" (1.74 m)   Wt 158 lb 3.2 oz (71.8 kg)   BMI 23.70 kg/m   Wt Readings from Last 3 Encounters:  03/31/17 158 lb 3.2 oz (71.8 kg)  11/23/16 157 lb (71.2 kg)  11/19/15 157 lb 8 oz (71.4 kg)    Physical Exam  Constitutional: He is oriented to person, place, and time. He appears well-developed and well-nourished.  HENT:  Head: Normocephalic.  Right Ear: External ear normal.  Left Ear: External ear normal.  Nose: Nose normal.  Eyes: Conjunctivae and EOM are normal. Pupils are equal, round, and reactive to light.  Neck: Normal range of motion. Neck supple. No thyromegaly present.  Cardiovascular: Normal rate, regular rhythm, normal heart sounds and intact distal pulses.   Pulmonary/Chest: Effort normal and breath sounds  normal.  Abdominal: Soft. Bowel sounds are normal. There is no splenomegaly or hepatomegaly.  Genitourinary: Penis normal.  Musculoskeletal: Normal range of motion.  Lymphadenopathy:    He has no cervical adenopathy.  Neurological: He is alert and oriented to person, place, and time. He has normal reflexes.  Skin: Skin is warm and dry.  Psychiatric: He has a normal mood and affect. His behavior is normal. Judgment and thought content normal.    Results for orders placed or performed in visit on 11/23/16  CBC with Differential/Platelet  Result Value Ref Range   WBC 7.2 4.0 - 10.5 K/uL   RBC 5.22 4.22 - 5.81 Mil/uL   Hemoglobin 15.4 13.0 - 17.0 g/dL   HCT 45.2 39.0 - 52.0 %   MCV 86.7 78.0 - 100.0 fl   MCHC 34.0 30.0 - 36.0 g/dL   RDW 13.1 11.5 - 15.5 %   Platelets 233.0 150.0 - 400.0 K/uL   Neutrophils Relative % 65.6 43.0 - 77.0 %   Lymphocytes Relative 22.0 12.0 - 46.0 %   Monocytes Relative 9.2 3.0 - 12.0 %   Eosinophils Relative 2.4 0.0 - 5.0 %   Basophils Relative 0.8 0.0 - 3.0 %   Neutro Abs 4.7 1.4 - 7.7 K/uL   Lymphs Abs 1.6 0.7 - 4.0 K/uL   Monocytes Absolute 0.7 0.1 - 1.0 K/uL   Eosinophils Absolute 0.2 0.0 - 0.7 K/uL  Basophils Absolute 0.1 0.0 - 0.1 K/uL  Comprehensive metabolic panel  Result Value Ref Range   Sodium 141 135 - 145 mEq/L   Potassium 4.3 3.5 - 5.1 mEq/L   Chloride 106 96 - 112 mEq/L   CO2 28 19 - 32 mEq/L   Glucose, Bld 87 70 - 99 mg/dL   BUN 14 6 - 23 mg/dL   Creatinine, Ser 0.87 0.40 - 1.50 mg/dL   Total Bilirubin 1.0 0.2 - 1.2 mg/dL   Alkaline Phosphatase 78 39 - 117 U/L   AST 17 0 - 37 U/L   ALT 16 0 - 53 U/L   Total Protein 6.9 6.0 - 8.3 g/dL   Albumin 4.5 3.5 - 5.2 g/dL   Calcium 9.4 8.4 - 10.5 mg/dL   GFR 92.65 >60.00 mL/min  Lipid panel  Result Value Ref Range   Cholesterol 160 0 - 200 mg/dL   Triglycerides 97.0 0.0 - 149.0 mg/dL   HDL 38.20 (L) >39.00 mg/dL   VLDL 19.4 0.0 - 40.0 mg/dL   LDL Cholesterol 102 (H) 0 - 99 mg/dL    Total CHOL/HDL Ratio 4    NonHDL 121.31   TSH  Result Value Ref Range   TSH 3.06 0.35 - 4.50 uIU/mL      Assessment & Plan:   Problem List Items Addressed This Visit    None    Visit Diagnoses    History and physical examination, occupation    -  Primary       Follow up plan: Return if symptoms worsen or fail to improve.

## 2017-09-27 ENCOUNTER — Other Ambulatory Visit: Payer: Self-pay | Admitting: Internal Medicine

## 2017-09-27 ENCOUNTER — Ambulatory Visit (INDEPENDENT_AMBULATORY_CARE_PROVIDER_SITE_OTHER): Payer: Medicare Other

## 2017-09-27 DIAGNOSIS — Z23 Encounter for immunization: Secondary | ICD-10-CM | POA: Diagnosis not present

## 2017-09-27 MED ORDER — CIPROFLOXACIN HCL 500 MG PO TABS: 500.0000 mg | ORAL_TABLET | Freq: Two times a day (BID) | ORAL | 0 refills | Status: DC

## 2017-09-27 MED ORDER — ATOVAQUONE-PROGUANIL HCL 250-100 MG PO TABS: 1.0000 | ORAL_TABLET | Freq: Every day | ORAL | 0 refills | Status: DC

## 2017-11-23 ENCOUNTER — Encounter: Payer: Self-pay | Admitting: Internal Medicine

## 2017-11-23 NOTE — Progress Notes (Signed)
Subjective:    Patient ID: Brian [name], male    DOB: 06-Apr-1946, 71 y.o.   MRN: [id number]  HPI Here for medicare wellness exam and an annual physical exam.   I have personally reviewed and have noted 1.The patient's medical and social history 2.Their use of alcohol, tobacco or illicit drugs 3.Their current medications and supplements 4.The patient's functional ability including ADL's, fall risks, home                 safety risk and hearing or visual impairment. 5.Diet and physical activities 6.Evidence for depression or mood disorders 7.Care team reviewed  - Neuro - Dr Domingo Cocking, urology - Dr Jeffie Pollock,  Allergy - Dr Fredderick Phenix, Eye - Dr Sabra Heck  He does monitor his blood pressure at home and it is well controlled.  He is an appointment with urology tomorrow.  Are there smokers in your home (other than you)? No  Risk Factors Exercise:  Regular, goes to gym, has a trainer -1/week, does elliptical at home Dietary issues discussed: well balanced, healthy, eats at home mostly, low sweets  Vitamin and supplement use: reviewed and helpful  Opiod use:  none Side effects from medication:  N/a Does medications benefits outweigh risks/side effects:   n/a  Cardiac risk factors: advanced age, hypertension, hyperlipidemia  Depression Screen  Have you felt down, depressed or hopeless? No  Have you felt little interest or pleasure in doing things?  No  Activities of Daily Living In your present state of health, do you have any difficulty performing the following activities?:  Driving? No Managing money?  No Feeding yourself? No Getting from bed to chair? No Climbing a flight of stairs? No Preparing food and eating?: No Bathing or showering? No Getting dressed: No Getting to/using the toilet? No Moving around from place to place: No In the past year have you fallen or had a near fall?: No   Are you sexually active?   yes  Do you have more than one partner?  no  Hearing Difficulties: No Do you often ask people to speak up or repeat themselves? No Do you experience ringing or noises in your ears? No Do you have difficulty understanding soft or whispered voices? No Vision:              Any change in vision:  no             Up to date with eye exam:   yes  Memory:  Do you feel that you have a problem with memory? No  Do you often misplace items? No  Do you feel safe at home?  Yes  Cognitive Testing  Alert, Orientated? Yes  Normal Appearance? Yes  Recall of three objects?  Yes  Can perform simple calculations? Yes  Displays appropriate judgment? Yes  Can read the correct time from a watch face? Yes   Advanced Directives have been discussed with the patient? Yes - in place      Medications and allergies reviewed with patient and updated if appropriate.  Patient Active Problem List   Diagnosis Date Noted  . Allergic rhinitis 11/23/2016  . BPV (benign positional vertigo) 11/19/2015  . Multiple food allergies   . 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 History of prostate cancer 03/20/2012  . Diverticulosis of large intestine 12/02/2008  . History of colonic polyps 12/02/2008  .  Low HDL (under 40) 02/12/2008  . Elevated blood pressure reading without diagnosis of hypertension 02/12/2008  . Headache 02/12/2008    Current Outpatient Medications on File Prior to Visit  Medication Sig Dispense Refill  . ASPIRIN 81 PO Take by mouth daily.    Marland Kitchen EPIPEN 2-PAK 0.3 MG/0.3ML SOAJ injection Inject 1 mg into the muscle as needed.    . fish oil-omega-3 fatty acids 1000 MG capsule Take 1 g by mouth daily. Takes 500 mg daily    . fluticasone (FLONASE) 50 MCG/ACT nasal spray Place 1 spray into both nostrils 2 (two) times daily as needed for rhinitis. 16 g 5  . glucosamine-chondroitin 500-400 MG  tablet Take 1 tablet by mouth daily.     . Multiple Vitamins-Minerals (MACUVITE PO) Take by mouth.    Marland Kitchen PRESCRIPTION MEDICATION Inject into the muscle every 30 (thirty) days. ALLERGY INJECTION'S MONTHLY    . SUMAtriptan (IMITREX) 100 MG tablet Take 100 mg by mouth every 2 (two) hours as needed.    . vardenafil (LEVITRA) 20 MG tablet Take 1 tablet (20 mg total) by mouth daily as needed. Takes 5 mg daily (cuts tablet in 1/4) 10 tablet 2  . vitamin C (ASCORBIC ACID) 500 MG tablet Take 500 mg by mouth daily.      No current facility-administered medications on file prior to visit.     Past Medical History:  Diagnosis Date  . Arthritis LOW BACK AND KNEES  . Hypertension   . Migraine headache    Dr Domingo Cocking  . Multiple food allergies   . Nocturia   . Prostate cancer ([place]) 06/14/12   Gleason 3+3=6, volume 33.84 cc  . Seasonal allergies    on immunotherapy    Past Surgical History:  Procedure Laterality Date  . BIOPSY PROSTATE  06/14/2012   [facility]  . COLONOSCOPY  05/29/2010    "A sessile polyp was found in the ascending colon.  It was 5 mm in size.   A sessile polyp was found the the distal transverse colon.  It was 3 mm in size.   Moderate diverticulosis was found in the sigmoid colon."   . CYSTOSCOPY  10/26/2012   Procedure: CYSTOSCOPY FLEXIBLE;  Surgeon: Malka So, MD;  Location: [facility];  Service: Urology;  Laterality: N/A;  . HEMORRHOID SURGERY  05-19-2006   [place]  . POLYPECTOMY    . RADIOACTIVE SEED IMPLANT  10/26/2012   Procedure: RADIOACTIVE SEED IMPLANT;  Surgeon: Malka So, MD;  Location: [facility];  Service: Urology;  Laterality: N/A;  . TONSILLECTOMY  2280   71 years old  . WRIST ARTHROSCOPY  03-27-2003   DEBRIDEMENT VOLAR ULNAR  & SCAPHOLUNATE LIGAMENT TEARS OF RIGHT WRIST    Social History   Socioeconomic History  . Marital status: Married    Spouse name: None  . Number of children: None  . Years of education: None  .  Highest education level: None  Social Needs  . Financial resource strain: None  . Food insecurity - worry: None  . Food insecurity - inability: None  . Transportation needs - medical: None  . Transportation needs - non-medical: None  Occupational History  . Occupation: OWNER    Employer: [employer]    Comment: Manufactures aircraft parts  Tobacco Use  . Smoking status: Never Smoker  . Smokeless tobacco: Never Used  Substance and Sexual Activity  . Alcohol use: No  . Drug use: No  . Sexual  activity: Yes  Other Topics Concern  . None  Social History Narrative   Married over 52 years      2 daughters      03/20/12 PSA 4.03    Family History  Problem Relation Age of Onset  . Hypertension Father   . Heart failure Father   . Colon polyps Father   . Diabetes Mother   . Hypertension Mother   . Hypertension Maternal Grandmother   . Diabetes Maternal Grandmother   . Stroke Maternal Grandfather        > 55  . Bone cancer Paternal Grandfather   . Colon cancer Neg Hx   . Esophageal cancer Neg Hx   . Rectal cancer Neg Hx   . Stomach cancer Neg Hx     Review of Systems  Constitutional: Negative for chills and fever.  Eyes: Negative for visual disturbance.  Respiratory: Negative for cough, shortness of breath and wheezing.   Cardiovascular: Negative for chest pain, palpitations and leg swelling.  Gastrointestinal: Negative for abdominal pain, blood in stool, constipation, diarrhea and nausea.       No gerd  Genitourinary: Negative for dysuria and hematuria.  Musculoskeletal: Positive for arthralgias (mild) and back pain (rare lower back).  Skin: Negative for color change and rash.  Neurological: Positive for headaches (migranes). Negative for dizziness and light-headedness.  Psychiatric/Behavioral: Negative for dysphoric mood. The patient is not nervous/anxious.        Objective:   Vitals:   11/24/17 0757  BP: (!) 144/82  Pulse: (!) 58  Resp: 16  Temp: 98.7 F  (37.1 C)  SpO2: 98%   Filed Weights   11/24/17 0757  Weight: 157 lb (71.2 kg)   Body mass index is 23.52 kg/m.  Wt Readings from Last 3 Encounters:  11/24/17 157 lb (71.2 kg)  03/31/17 158 lb 3.2 oz (71.8 kg)  11/23/16 157 lb (71.2 kg)     Physical Exam Constitutional: He appears well-developed and well-nourished. No distress.  HENT:  Head: Normocephalic and atraumatic.  Right Ear: External ear normal.  Left Ear: External ear normal.  Mouth/Throat: Oropharynx is clear and moist.  Normal ear canals and TM b/l  Eyes: Conjunctivae and EOM are normal.  Neck: Neck supple. No tracheal deviation present. No thyromegaly present.  No carotid bruit  Cardiovascular: Normal rate, regular rhythm, normal heart sounds and intact distal pulses.   No murmur heard. Pulmonary/Chest: Effort normal and breath sounds normal. No respiratory distress. He has no wheezes. He has no rales.  Abdominal: Soft. He exhibits no distension. There is no tenderness.  Genitourinary: deferred  Musculoskeletal: He exhibits no edema.  Lymphadenopathy:   He has no cervical adenopathy.  Skin: Skin is warm and dry. He is not diaphoretic.  Psychiatric: He has a normal mood and affect. His behavior is normal.         Assessment & Plan:   Wellness Exam: Immunizations  Discussed shingrix, others up to date Colonoscopy   Up to date  Eye exam    Up to date  Hearing loss   no Memory concerns/difficulties   none Independent of ADLs    fully Stressed the importance of regular exercise   Patient received copy of preventative screening tests/immunizations recommended for the next 5-10 years.  Physical exam: Screening blood work  ordered Immunizations  Discussed shingrix, others up to date Colonoscopy   Up to date  Eye exams   Up to date  EKG  Last EKG 10/2016 Exercise  regular Weight   Normal BMI Skin   No concerns Substance abuse  none  See Problem List for Assessment and Plan of chronic medical  problems.   FU in one year CPE/wellness

## 2017-11-23 NOTE — Patient Instructions (Addendum)
[name] , Thank you for taking time to come for your Medicare Wellness Visit. I appreciate your ongoing commitment to your health goals. Please review the following plan we discussed and let me know if I can assist you in the future.   These are the goals we discussed: Goals    Consider getting the shingles vaccine - shingrix - call your pharmacy      This is a list of the screening recommended for you and due dates:  Health Maintenance  Topic Date Due  . Colon Cancer Screening  06/23/2020  . Tetanus Vaccine  09/30/2021  . Flu Shot  Completed  .  Hepatitis C: One time screening is recommended by Center for Disease Control  (CDC) for  adults born from [birth year] through 1963.   Completed  . Pneumonia vaccines  Completed    Test(s) ordered today. Your results will be released to [facility] (or called to you) after review, usually within 72hours after test completion. If any changes need to be made, you will be notified at that same time.  All other Health Maintenance issues reviewed.   All recommended immunizations and age-appropriate screenings are up-to-date or discussed.  No immunizations administered today.   Medications reviewed and updated.   No changes recommended at this time.   Please followup in one year      Health Maintenance, Male A healthy lifestyle and preventive care is important for your health and wellness. Ask your health care provider about what schedule of regular examinations is right for you. What should I know about weight and diet? Eat a Healthy Diet  Eat plenty of vegetables, fruits, whole grains, low-fat dairy products, and lean protein.  Do not eat a lot of foods high in solid fats, added sugars, or salt.  Maintain a Healthy Weight Regular exercise can help you achieve or maintain a healthy weight. You should:  Do at least 150 minutes of exercise each week. The exercise should increase your heart rate and make you sweat (moderate-intensity  exercise).  Do strength-training exercises at least twice a week.  Watch Your Levels of Cholesterol and Blood Lipids  Have your blood tested for lipids and cholesterol every 5 years starting at 69 years of age. If you are at high risk for heart disease, you should start having your blood tested when you are 69 years old. You may need to have your cholesterol levels checked more often if: ? Your lipid or cholesterol levels are high. ? You are older than 69 years of age. ? You are at high risk for heart disease.  What should I know about cancer screening? Many types of cancers can be detected early and may often be prevented. Lung Cancer  You should be screened every year for lung cancer if: ? You are a current smoker who has smoked for at least 30 years. ? You are a former smoker who has quit within the past 15 years.  Talk to your health care provider about your screening options, when you should start screening, and how often you should be screened.  Colorectal Cancer  Routine colorectal cancer screening usually begins at 69 years of age and should be repeated every 5-10 years until you are 69 years old. You may need to be screened more often if early forms of precancerous polyps or small growths are found. Your health care provider may recommend screening at an earlier age if you have risk factors for colon cancer.  Your health care  provider may recommend using home test kits to check for hidden blood in the stool.  A small camera at the end of a tube can be used to examine your colon (sigmoidoscopy or colonoscopy). This checks for the earliest forms of colorectal cancer.  Prostate and Testicular Cancer  Depending on your age and overall health, your health care provider may do certain tests to screen for prostate and testicular cancer.  Talk to your health care provider about any symptoms or concerns you have about testicular or prostate cancer.  Skin Cancer  Check your skin  from head to toe regularly.  Tell your health care provider about any new moles or changes in moles, especially if: ? There is a change in a mole's size, shape, or color. ? You have a mole that is larger than a pencil eraser.  Always use sunscreen. Apply sunscreen liberally and repeat throughout the day.  Protect yourself by wearing long sleeves, pants, a wide-brimmed hat, and sunglasses when outside.  What should I know about heart disease, diabetes, and high blood pressure?  If you are 16-55 years of age, have your blood pressure checked every 3-5 years. If you are 80 years of age or older, have your blood pressure checked every year. You should have your blood pressure measured twice-once when you are at a hospital or clinic, and once when you are not at a hospital or clinic. Record the average of the two measurements. To check your blood pressure when you are not at a hospital or clinic, you can use: ? An automated blood pressure machine at a pharmacy. ? A home blood pressure monitor.  Talk to your health care provider about your target blood pressure.  If you are between 18-80 years old, ask your health care provider if you should take aspirin to prevent heart disease.  Have regular diabetes screenings by checking your fasting blood sugar level. ? If you are at a normal weight and have a low risk for diabetes, have this test once every three years after the age of 82. ? If you are overweight and have a high risk for diabetes, consider being tested at a younger age or more often.  A one-time screening for abdominal aortic aneurysm (AAA) by ultrasound is recommended for men aged 65-75 years who are current or former smokers. What should I know about preventing infection? Hepatitis B If you have a higher risk for hepatitis B, you should be screened for this virus. Talk with your health care provider to find out if you are at risk for hepatitis B infection. Hepatitis C Blood testing is  recommended for:  Everyone born from [birth year] through 1963.  Anyone with known risk factors for hepatitis C.  Sexually Transmitted Diseases (STDs)  You should be screened each year for STDs including gonorrhea and chlamydia if: ? You are sexually active and are younger than 69 years of age. ? You are older than 69 years of age and your health care provider tells you that you are at risk for this type of infection. ? Your sexual activity has changed since you were last screened and you are at an increased risk for chlamydia or gonorrhea. Ask your health care provider if you are at risk.  Talk with your health care provider about whether you are at high risk of being infected with HIV. Your health care provider may recommend a prescription medicine to help prevent HIV infection.  What else can I do?  Schedule regular  health, dental, and eye exams.  Stay current with your vaccines (immunizations).  Do not use any tobacco products, such as cigarettes, chewing tobacco, and e-cigarettes. If you need help quitting, ask your health care provider.  Limit alcohol intake to no more than 2 drinks per day. One drink equals 12 ounces of beer, 5 ounces of wine, or 1 ounces of hard liquor.  Do not use street drugs.  Do not share needles.  Ask your health care provider for help if you need support or information about quitting drugs.  Tell your health care provider if you often feel depressed.  Tell your health care provider if you have ever been abused or do not feel safe at home. This information is not intended to replace advice given to you by your health care provider. Make sure you discuss any questions you have with your health care provider. Document Released: 06/10/2008 Document Revised: 08/11/2016 Document Reviewed: 09/16/2015 Elsevier Interactive Patient Education  [employer].

## 2017-11-23 NOTE — Assessment & Plan Note (Signed)
Following with urology No evidence of recurrence 

## 2017-11-24 ENCOUNTER — Encounter: Payer: Self-pay | Admitting: Internal Medicine

## 2017-11-24 ENCOUNTER — Ambulatory Visit (INDEPENDENT_AMBULATORY_CARE_PROVIDER_SITE_OTHER): Payer: Medicare Other | Admitting: Internal Medicine

## 2017-11-24 VITALS — BP 144/82 | HR 58 | Temp 98.7°F | Resp 16 | Ht 68.5 in | Wt 157.0 lb

## 2017-11-24 DIAGNOSIS — R51 Headache: Secondary | ICD-10-CM | POA: Diagnosis not present

## 2017-11-24 DIAGNOSIS — Z8546 Personal history of malignant neoplasm of prostate: Secondary | ICD-10-CM | POA: Diagnosis not present

## 2017-11-24 DIAGNOSIS — R030 Elevated blood-pressure reading, without diagnosis of hypertension: Secondary | ICD-10-CM

## 2017-11-24 DIAGNOSIS — Z0000 Encounter for general adult medical examination without abnormal findings: Secondary | ICD-10-CM

## 2017-11-24 DIAGNOSIS — J3089 Other allergic rhinitis: Secondary | ICD-10-CM

## 2017-11-24 DIAGNOSIS — R519 Headache, unspecified: Secondary | ICD-10-CM

## 2017-11-24 NOTE — Assessment & Plan Note (Signed)
Well-controlled Continuing allergy injections Management per allergy

## 2017-11-24 NOTE — Assessment & Plan Note (Signed)
Blood pressure well controlled at home Continue to monitor No medication needed

## 2017-11-24 NOTE — Assessment & Plan Note (Signed)
Follows with Dr Avis Epley imitrex as needed - takes small dose - about 1 tablet a month or less

## 2017-11-25 ENCOUNTER — Other Ambulatory Visit (INDEPENDENT_AMBULATORY_CARE_PROVIDER_SITE_OTHER): Payer: Medicare Other

## 2017-11-25 DIAGNOSIS — Z0000 Encounter for general adult medical examination without abnormal findings: Secondary | ICD-10-CM | POA: Diagnosis not present

## 2017-11-25 LAB — CBC WITH DIFFERENTIAL/PLATELET
BASOS ABS: 0.1 10*3/uL (ref 0.0–0.1)
Basophils Relative: 1.2 % (ref 0.0–3.0)
Eosinophils Absolute: 0.2 10*3/uL (ref 0.0–0.7)
Eosinophils Relative: 3.4 % (ref 0.0–5.0)
HCT: 46.6 % (ref 39.0–52.0)
HEMOGLOBIN: 15.9 g/dL (ref 13.0–17.0)
LYMPHS ABS: 1.7 10*3/uL (ref 0.7–4.0)
Lymphocytes Relative: 31.1 % (ref 12.0–46.0)
MCHC: 34.1 g/dL (ref 30.0–36.0)
MCV: 87.9 fl (ref 78.0–100.0)
MONO ABS: 0.5 10*3/uL (ref 0.1–1.0)
Monocytes Relative: 9.3 % (ref 3.0–12.0)
NEUTROS PCT: 55 % (ref 43.0–77.0)
Neutro Abs: 3 10*3/uL (ref 1.4–7.7)
Platelets: 209 10*3/uL (ref 150.0–400.0)
RBC: 5.3 Mil/uL (ref 4.22–5.81)
RDW: 13.1 % (ref 11.5–15.5)
WBC: 5.5 10*3/uL (ref 4.0–10.5)

## 2017-11-25 LAB — LIPID PANEL
CHOLESTEROL: 164 mg/dL (ref 0–200)
HDL: 35.4 mg/dL — AB (ref >39.00)
LDL CALC: 111 mg/dL — AB (ref 0–99)
NonHDL: 129.05
TRIGLYCERIDES: 92 mg/dL (ref 0.0–149.0)
Total CHOL/HDL Ratio: 5
VLDL: 18.4 mg/dL (ref 0.0–40.0)

## 2017-11-25 LAB — COMPREHENSIVE METABOLIC PANEL
ALBUMIN: 4.4 g/dL (ref 3.5–5.2)
ALK PHOS: 78 U/L (ref 39–117)
ALT: 17 U/L (ref 0–53)
AST: 17 U/L (ref 0–37)
BUN: 15 mg/dL (ref 6–23)
CALCIUM: 9.5 mg/dL (ref 8.4–10.5)
CO2: 30 mEq/L (ref 19–32)
Chloride: 104 mEq/L (ref 96–112)
Creatinine, Ser: 0.9 mg/dL (ref 0.40–1.50)
GFR: 88.83 mL/min (ref >60.00)
Glucose, Bld: 92 mg/dL (ref 70–99)
POTASSIUM: 4.2 meq/L (ref 3.5–5.1)
SODIUM: 139 meq/L (ref 135–145)
TOTAL PROTEIN: 7.1 g/dL (ref 6.0–8.3)
Total Bilirubin: 0.9 mg/dL (ref 0.2–1.2)

## 2017-11-25 LAB — TSH: TSH: 3.42 u[IU]/mL (ref 0.35–4.50)

## 2017-11-26 ENCOUNTER — Encounter: Payer: Self-pay | Admitting: Internal Medicine

## 2018-04-28 ENCOUNTER — Other Ambulatory Visit: Payer: Self-pay | Admitting: Emergency Medicine

## 2018-04-28 DIAGNOSIS — Z1159 Encounter for screening for other viral diseases: Secondary | ICD-10-CM

## 2018-05-02 ENCOUNTER — Other Ambulatory Visit: Payer: Medicare Other

## 2018-05-02 DIAGNOSIS — Z1159 Encounter for screening for other viral diseases: Secondary | ICD-10-CM

## 2018-05-03 LAB — RUBEOLA ANTIBODY IGG: Rubeola IgG: >300 AU/mL

## 2018-09-05 ENCOUNTER — Other Ambulatory Visit: Payer: Self-pay | Admitting: Internal Medicine

## 2018-09-05 NOTE — Telephone Encounter (Signed)
Pt would like a RX for anti-malaria (Malarone) and Cipro (just in case). They are traveling to [place] in October, please advise

## 2018-09-05 NOTE — Telephone Encounter (Signed)
meds pending -- I was not sure how long the trip was - the prescriptions are the same as he had last time.

## 2018-09-07 ENCOUNTER — Ambulatory Visit (INDEPENDENT_AMBULATORY_CARE_PROVIDER_SITE_OTHER): Payer: Medicare Other

## 2018-09-07 DIAGNOSIS — Z23 Encounter for immunization: Secondary | ICD-10-CM

## 2018-09-07 MED ORDER — ATOVAQUONE-PROGUANIL HCL 250-100 MG PO TABS: 1.0000 | ORAL_TABLET | Freq: Every day | ORAL | 0 refills | Status: DC

## 2018-09-07 MED ORDER — CIPROFLOXACIN HCL 500 MG PO TABS: 500.0000 mg | ORAL_TABLET | Freq: Two times a day (BID) | ORAL | 0 refills | Status: DC

## 2018-09-07 NOTE — Telephone Encounter (Signed)
LVM for pt to either call back or send my chart message with how long he will be gone on his trip.

## 2018-09-07 NOTE — Telephone Encounter (Signed)
Will be gone for 8 days.

## 2018-12-05 NOTE — Patient Instructions (Addendum)
Tests ordered today. Your results will be released to [place] (or called to you) after review, usually within 72hours after test completion. If any changes need to be made, you will be notified at that same time.  All other Health Maintenance issues reviewed.   All recommended immunizations and age-appropriate screenings are up-to-date or discussed.  No immunizations administered today.   Medications reviewed and updated.  Changes include :   none    Please followup in one year    Health Maintenance, Male A healthy lifestyle and preventive care is important for your health and wellness. Ask your health care provider about what schedule of regular examinations is right for you. What should I know about weight and diet? Eat a Healthy Diet  Eat plenty of vegetables, fruits, whole grains, low-fat dairy products, and lean protein.  Do not eat a lot of foods high in solid fats, added sugars, or salt.  Maintain a Healthy Weight Regular exercise can help you achieve or maintain a healthy weight. You should:  Do at least 150 minutes of exercise each week. The exercise should increase your heart rate and make you sweat (moderate-intensity exercise).  Do strength-training exercises at least twice a week.  Watch Your Levels of Cholesterol and Blood Lipids  Have your blood tested for lipids and cholesterol every 5 years starting at 70 years of age. If you are at high risk for heart disease, you should start having your blood tested when you are 70 years old. You may need to have your cholesterol levels checked more often if: ? Your lipid or cholesterol levels are high. ? You are older than 70 years of age. ? You are at high risk for heart disease.  What should I know about cancer screening? Many types of cancers can be detected early and may often be prevented. Lung Cancer  You should be screened every year for lung cancer if: ? You are a current smoker who has smoked for at least 30  years. ? You are a former smoker who has quit within the past 15 years.  Talk to your health care provider about your screening options, when you should start screening, and how often you should be screened.  Colorectal Cancer  Routine colorectal cancer screening usually begins at 70 years of age and should be repeated every 5-10 years until you are 70 years old. You may need to be screened more often if early forms of precancerous polyps or small growths are found. Your health care provider may recommend screening at an earlier age if you have risk factors for colon cancer.  Your health care provider may recommend using home test kits to check for hidden blood in the stool.  A small camera at the end of a tube can be used to examine your colon (sigmoidoscopy or colonoscopy). This checks for the earliest forms of colorectal cancer.  Prostate and Testicular Cancer  Depending on your age and overall health, your health care provider may do certain tests to screen for prostate and testicular cancer.  Talk to your health care provider about any symptoms or concerns you have about testicular or prostate cancer.  Skin Cancer  Check your skin from head to toe regularly.  Tell your health care provider about any new moles or changes in moles, especially if: ? There is a change in a mole's size, shape, or color. ? You have a mole that is larger than a pencil eraser.  Always use sunscreen. Apply sunscreen  liberally and repeat throughout the day.  Protect yourself by wearing long sleeves, pants, a wide-brimmed hat, and sunglasses when outside.  What should I know about heart disease, diabetes, and high blood pressure?  If you are 87-38 years of age, have your blood pressure checked every 3-5 years. If you are 89 years of age or older, have your blood pressure checked every year. You should have your blood pressure measured twice-once when you are at a hospital or clinic, and once when you are  not at a hospital or clinic. Record the average of the two measurements. To check your blood pressure when you are not at a hospital or clinic, you can use: ? An automated blood pressure machine at a pharmacy. ? A home blood pressure monitor.  Talk to your health care provider about your target blood pressure.  If you are between 11-56 years old, ask your health care provider if you should take aspirin to prevent heart disease.  Have regular diabetes screenings by checking your fasting blood sugar level. ? If you are at a normal weight and have a low risk for diabetes, have this test once every three years after the age of 71. ? If you are overweight and have a high risk for diabetes, consider being tested at a younger age or more often.  A one-time screening for abdominal aortic aneurysm (AAA) by ultrasound is recommended for men aged 55-75 years who are current or former smokers. What should I know about preventing infection? Hepatitis B If you have a higher risk for hepatitis B, you should be screened for this virus. Talk with your health care provider to find out if you are at risk for hepatitis B infection. Hepatitis C Blood testing is recommended for:  Everyone born from [birth year] through 1965.  Anyone with known risk factors for hepatitis C.  Sexually Transmitted Diseases (STDs)  You should be screened each year for STDs including gonorrhea and chlamydia if: ? You are sexually active and are younger than 70 years of age. ? You are older than 70 years of age and your health care provider tells you that you are at risk for this type of infection. ? Your sexual activity has changed since you were last screened and you are at an increased risk for chlamydia or gonorrhea. Ask your health care provider if you are at risk.  Talk with your health care provider about whether you are at high risk of being infected with HIV. Your health care provider may recommend a prescription medicine to help  prevent HIV infection.  What else can I do?  Schedule regular health, dental, and eye exams.  Stay current with your vaccines (immunizations).  Do not use any tobacco products, such as cigarettes, chewing tobacco, and e-cigarettes. If you need help quitting, ask your health care provider.  Limit alcohol intake to no more than 2 drinks per day. One drink equals 12 ounces of beer, 5 ounces of wine, or 1 ounces of hard liquor.  Do not use street drugs.  Do not share needles.  Ask your health care provider for help if you need support or information about quitting drugs.  Tell your health care provider if you often feel depressed.  Tell your health care provider if you have ever been abused or do not feel safe at home. This information is not intended to replace advice given to you by your health care provider. Make sure you discuss any questions you have with your  health care provider. Document Released: 06/10/2008 Document Revised: 08/11/2016 Document Reviewed: 09/16/2015 Elsevier Interactive Patient Education  [employer].

## 2018-12-05 NOTE — Progress Notes (Signed)
Subjective:    Patient ID: YOUSOF [name], male    DOB: 1946-11-03, 72 y.o.   MRN: [id number]  HPI He is here for a physical exam.   He denies any changes in his health since he was here last and has no concerns.   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   Medications and allergies reviewed with patient and updated if appropriate.  Patient Active Problem List   Diagnosis Date Noted  . Allergic rhinitis 11/23/2016  . BPV (benign positional vertigo) 11/19/2015  . Multiple food allergies   . 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 History of prostate cancer 03/20/2012  . Diverticulosis of large intestine 12/02/2008  . History of colonic polyps 12/02/2008  . Low HDL (under 40) 02/12/2008  . Elevated blood pressure reading without diagnosis of hypertension 02/12/2008  . Headache 02/12/2008    Current Outpatient Medications on File Prior to Visit  Medication Sig Dispense Refill  . EPIPEN 2-PAK 0.3 MG/0.3ML SOAJ injection Inject 1 mg into the muscle as needed.    . fish oil-omega-3 fatty acids 1000 MG capsule Take 1 g by mouth daily. Takes 500 mg daily    . glucosamine-chondroitin 500-400 MG tablet Take 1 tablet by mouth daily.     . Multiple Vitamins-Minerals (MACUVITE PO) Take by mouth.    Marland Kitchen PRESCRIPTION MEDICATION Inject into the muscle every 30 (thirty) days. ALLERGY INJECTION'S MONTHLY    . SUMAtriptan (IMITREX) 100 MG tablet Take 25 mg by mouth every 2 (two) hours as needed.     . vardenafil (LEVITRA) 20 MG tablet Take 1 tablet (20 mg total) by mouth daily as needed. Takes 5 mg daily (cuts tablet in 1/4) 10 tablet 2  . vitamin C (ASCORBIC ACID) 500 MG tablet Take 500 mg by mouth daily.      No current facility-administered medications on file prior to visit.     Past Medical History:  Diagnosis Date  . Arthritis LOW BACK AND KNEES  .  Hypertension   . Migraine headache    Dr Domingo Cocking  . Multiple food allergies   . Nocturia   . Prostate cancer ([place]) 06/14/12   Gleason 3+3=6, volume 33.84 cc  . Seasonal allergies    on immunotherapy    Past Surgical History:  Procedure Laterality Date  . BIOPSY PROSTATE  06/14/2012   [facility]  . COLONOSCOPY  05/29/2010    "A sessile polyp was found in the ascending colon.  It was 5 mm in size.   A sessile polyp was found the the distal transverse colon.  It was 3 mm in size.   Moderate diverticulosis was found in the sigmoid colon."   . CYSTOSCOPY  10/26/2012   Procedure: CYSTOSCOPY FLEXIBLE;  Surgeon: Malka So, MD;  Location: [facility];  Service: Urology;  Laterality: N/A;  . HEMORRHOID SURGERY  05-19-2006   [place]  . POLYPECTOMY    . RADIOACTIVE SEED IMPLANT  10/26/2012   Procedure: RADIOACTIVE SEED IMPLANT;  Surgeon: Malka So, MD;  Location: [facility];  Service: Urology;  Laterality: N/A;  . TONSILLECTOMY  5626   72 years old  . WRIST ARTHROSCOPY  03-27-2003   DEBRIDEMENT VOLAR ULNAR  & SCAPHOLUNATE LIGAMENT TEARS OF RIGHT WRIST    Social History   Socioeconomic History  .  Marital status: Married    Spouse name: Not on file  . Number of children: Not on file  . Years of education: Not on file  . Highest education level: Not on file  Occupational History  . Occupation: Information systems manager: [employer]    Comment: Astronomer  Social Needs  . Financial resource strain: Not on file  . Food insecurity:    Worry: Not on file    Inability: Not on file  . Transportation needs:    Medical: Not on file    Non-medical: Not on file  Tobacco Use  . Smoking status: Never Smoker  . Smokeless tobacco: Never Used  Substance and Sexual Activity  . Alcohol use: No  . Drug use: No  . Sexual activity: Yes  Lifestyle  . Physical activity:    Days per week: Not on file    Minutes per session: Not on file  . Stress: Not  on file  Relationships  . Social connections:    Talks on phone: Not on file    Gets together: Not on file    Attends religious service: Not on file    Active member of club or organization: Not on file    Attends meetings of clubs or organizations: Not on file    Relationship status: Not on file  Other Topics Concern  . Not on file  Social History Narrative   Married over 33 years      2 daughters      03/20/12 PSA 4.03    Family History  Problem Relation Age of Onset  . Hypertension Father   . Heart failure Father   . Colon polyps Father   . Diabetes Mother   . Hypertension Mother   . Hypertension Maternal Grandmother   . Diabetes Maternal Grandmother   . Stroke Maternal Grandfather        > 55  . Bone cancer Paternal Grandfather   . Colon cancer Neg Hx   . Esophageal cancer Neg Hx   . Rectal cancer Neg Hx   . Stomach cancer Neg Hx     Review of Systems  Constitutional: Negative for chills and fever.  Eyes: Negative for visual disturbance.  Respiratory: Negative for cough, shortness of breath and wheezing.   Cardiovascular: Negative for chest pain, palpitations (1/2 sec of flutter) and leg swelling.  Gastrointestinal: Negative for abdominal pain, blood in stool, constipation, diarrhea and nausea.       No gerd  Genitourinary: Negative for dysuria and hematuria.  Musculoskeletal: Negative for arthralgias and back pain.  Skin: Negative for color change and rash.  Neurological: Positive for headaches (infrequent). Negative for dizziness, light-headedness and numbness.  Psychiatric/Behavioral: Negative for dysphoric mood. The patient is not nervous/anxious.        Objective:   Vitals:   12/06/18 0753  BP: 126/84  Pulse: 67  Resp: 16  Temp: 97.9 F (36.6 C)  SpO2: 99%   Filed Weights   12/06/18 0753  Weight: 158 lb 12.8 oz (72 kg)   Body mass index is 23.79 kg/m.  Wt Readings from Last 3 Encounters:  12/06/18 158 lb 12.8 oz (72 kg)  11/24/17 157 lb  (71.2 kg)  03/31/17 158 lb 3.2 oz (71.8 kg)     Physical Exam Constitutional: He appears well-developed and well-nourished. No distress.  HENT:  Head: Normocephalic and atraumatic.  Right Ear: External ear normal.  Left Ear: External ear normal.  Mouth/Throat: Oropharynx is  clear and moist.  Normal ear canals and TM b/l  Eyes: Conjunctivae and EOM are normal.  Neck: Neck supple. No tracheal deviation present. No thyromegaly present.  No carotid bruit  Cardiovascular: Normal rate, regular rhythm, normal heart sounds and intact distal pulses.   No murmur heard. Pulmonary/Chest: Effort normal and breath sounds normal. No respiratory distress. He has no wheezes. He has no rales.  Abdominal: Soft. He exhibits no distension. There is no tenderness.  Genitourinary: deferred  Musculoskeletal: He exhibits no edema.  Lymphadenopathy:   He has no cervical adenopathy.  Skin: Skin is warm and dry. He is not diaphoretic.  Psychiatric: He has a normal mood and affect. His behavior is normal.         Assessment & Plan:   Physical exam: Screening blood work  ordered Immunizations   Discussed shingrix, others up to date Colonoscopy    Up to date  Eye exams   Up to date  Exercise   Regular - goes to gym  Weight  Normal BMI Skin   No concerns Substance abuse   none  See Problem List for Assessment and Plan of chronic medical problems.   FU in one year

## 2018-12-06 ENCOUNTER — Encounter: Payer: Self-pay | Admitting: Internal Medicine

## 2018-12-06 ENCOUNTER — Ambulatory Visit (INDEPENDENT_AMBULATORY_CARE_PROVIDER_SITE_OTHER): Payer: Medicare Other | Admitting: Internal Medicine

## 2018-12-06 ENCOUNTER — Other Ambulatory Visit (INDEPENDENT_AMBULATORY_CARE_PROVIDER_SITE_OTHER): Payer: Medicare Other

## 2018-12-06 VITALS — BP 126/84 | HR 67 | Temp 97.9°F | Resp 16 | Ht 68.5 in | Wt 158.8 lb

## 2018-12-06 DIAGNOSIS — R519 Headache, unspecified: Secondary | ICD-10-CM

## 2018-12-06 DIAGNOSIS — E786 Lipoprotein deficiency: Secondary | ICD-10-CM

## 2018-12-06 DIAGNOSIS — Z0000 Encounter for general adult medical examination without abnormal findings: Secondary | ICD-10-CM

## 2018-12-06 DIAGNOSIS — R51 Headache: Secondary | ICD-10-CM | POA: Diagnosis not present

## 2018-12-06 DIAGNOSIS — Z8546 Personal history of malignant neoplasm of prostate: Secondary | ICD-10-CM | POA: Diagnosis not present

## 2018-12-06 LAB — COMPREHENSIVE METABOLIC PANEL
ALBUMIN: 4.5 g/dL (ref 3.5–5.2)
ALK PHOS: 72 U/L (ref 39–117)
ALT: 13 U/L (ref 0–53)
AST: 16 U/L (ref 0–37)
BILIRUBIN TOTAL: 0.6 mg/dL (ref 0.2–1.2)
BUN: 14 mg/dL (ref 6–23)
CO2: 28 mEq/L (ref 19–32)
Calcium: 9.6 mg/dL (ref 8.4–10.5)
Chloride: 107 mEq/L (ref 96–112)
Creatinine, Ser: 0.9 mg/dL (ref 0.40–1.50)
GFR: 88.57 mL/min (ref >60.00)
GLUCOSE: 94 mg/dL (ref 70–99)
Potassium: 4.1 mEq/L (ref 3.5–5.1)
SODIUM: 140 meq/L (ref 135–145)
Total Protein: 6.9 g/dL (ref 6.0–8.3)

## 2018-12-06 LAB — CBC WITH DIFFERENTIAL/PLATELET
BASOS PCT: 1.2 % (ref 0.0–3.0)
Basophils Absolute: 0.1 10*3/uL (ref 0.0–0.1)
EOS ABS: 0.2 10*3/uL (ref 0.0–0.7)
Eosinophils Relative: 2.5 % (ref 0.0–5.0)
HEMATOCRIT: 44.4 % (ref 39.0–52.0)
HEMOGLOBIN: 15 g/dL (ref 13.0–17.0)
LYMPHS ABS: 1.4 10*3/uL (ref 0.7–4.0)
LYMPHS PCT: 23.2 % (ref 12.0–46.0)
MCHC: 33.7 g/dL (ref 30.0–36.0)
MCV: 86.3 fl (ref 78.0–100.0)
MONO ABS: 0.6 10*3/uL (ref 0.1–1.0)
Monocytes Relative: 9.7 % (ref 3.0–12.0)
NEUTROS ABS: 4 10*3/uL (ref 1.4–7.7)
Neutrophils Relative %: 63.4 % (ref 43.0–77.0)
Platelets: 203 10*3/uL (ref 150.0–400.0)
RBC: 5.14 Mil/uL (ref 4.22–5.81)
RDW: 13.9 % (ref 11.5–15.5)
WBC: 6.2 10*3/uL (ref 4.0–10.5)

## 2018-12-06 LAB — LIPID PANEL
CHOL/HDL RATIO: 4
Cholesterol: 146 mg/dL (ref 0–200)
HDL: 41.2 mg/dL (ref >39.00)
LDL Cholesterol: 86 mg/dL (ref 0–99)
NonHDL: 105.28
Triglycerides: 98 mg/dL (ref 0.0–149.0)
VLDL: 19.6 mg/dL (ref 0.0–40.0)

## 2018-12-06 LAB — TSH: TSH: 3.93 u[IU]/mL (ref 0.35–4.50)

## 2018-12-06 NOTE — Assessment & Plan Note (Signed)
Following with Dr Jeffie Pollock No evidence of recurrence

## 2018-12-06 NOTE — Assessment & Plan Note (Signed)
Exercising regularly Check cmp. Lipid, tsh

## 2018-12-06 NOTE — Assessment & Plan Note (Signed)
Following with Dr Domingo Cocking Infrequent headaches Takes imitrex as needed, which is very effective

## 2019-08-24 ENCOUNTER — Ambulatory Visit (INDEPENDENT_AMBULATORY_CARE_PROVIDER_SITE_OTHER): Payer: Medicare Other

## 2019-08-24 DIAGNOSIS — Z23 Encounter for immunization: Secondary | ICD-10-CM | POA: Diagnosis not present

## 2019-12-09 NOTE — Patient Instructions (Addendum)
Tests ordered today. Your results will be released to [place] (or called to you) after review.  If any changes need to be made, you will be notified at that same time.  All other Health Maintenance issues reviewed.   All recommended immunizations and age-appropriate screenings are up-to-date or discussed.  No immunization administered today.   Medications reviewed and updated.  Changes include :  none   Your prescription(s) have been submitted to your pharmacy. Please take as directed and contact our office if you believe you are having problem(s) with the medication(s).  A referral was ordered for Dr Fuller Plan.   Please followup in 1 year    Health Maintenance, Male Adopting a healthy lifestyle and getting preventive care are important in promoting health and wellness. Ask your health care provider about:  The right schedule for you to have regular tests and exams.  Things you can do on your own to prevent diseases and keep yourself healthy. What should I know about diet, weight, and exercise? Eat a healthy diet   Eat a diet that includes plenty of vegetables, fruits, low-fat dairy products, and lean protein.  Do not eat a lot of foods that are high in solid fats, added sugars, or sodium. Maintain a healthy weight Body mass index (BMI) is a measurement that can be used to identify possible weight problems. It estimates body fat based on height and weight. Your health care provider can help determine your BMI and help you achieve or maintain a healthy weight. Get regular exercise Get regular exercise. This is one of the most important things you can do for your health. Most adults should:  Exercise for at least 150 minutes each week. The exercise should increase your heart rate and make you sweat (moderate-intensity exercise).  Do strengthening exercises at least twice a week. This is in addition to the moderate-intensity exercise.  Spend less time sitting. Even light physical  activity can be beneficial. Watch cholesterol and blood lipids Have your blood tested for lipids and cholesterol at 71 years of age, then have this test every 5 years. You may need to have your cholesterol levels checked more often if:  Your lipid or cholesterol levels are high.  You are older than 71 years of age.  You are at high risk for heart disease. What should I know about cancer screening? Many types of cancers can be detected early and may often be prevented. Depending on your health history and family history, you may need to have cancer screening at various ages. This may include screening for:  Colorectal cancer.  Prostate cancer.  Skin cancer.  Lung cancer. What should I know about heart disease, diabetes, and high blood pressure? Blood pressure and heart disease  High blood pressure causes heart disease and increases the risk of stroke. This is more likely to develop in people who have high blood pressure readings, are of African descent, or are overweight.  Talk with your health care provider about your target blood pressure readings.  Have your blood pressure checked: ? Every 3-5 years if you are 45-49 years of age. ? Every year if you are 81 years old or older.  If you are between the ages of 41 and 45 and are a current or former smoker, ask your health care provider if you should have a one-time screening for abdominal aortic aneurysm (AAA). Diabetes Have regular diabetes screenings. This checks your fasting blood sugar level. Have the screening done:  Once every three  years after age 91 if you are at a normal weight and have a low risk for diabetes.  More often and at a younger age if you are overweight or have a high risk for diabetes. What should I know about preventing infection? Hepatitis B If you have a higher risk for hepatitis B, you should be screened for this virus. Talk with your health care provider to find out if you are at risk for hepatitis B  infection. Hepatitis C Blood testing is recommended for:  Everyone born from [birth year] through 1963.  Anyone with known risk factors for hepatitis C. Sexually transmitted infections (STIs)  You should be screened each year for STIs, including gonorrhea and chlamydia, if: ? You are sexually active and are younger than 71 years of age. ? You are older than 71 years of age and your health care provider tells you that you are at risk for this type of infection. ? Your sexual activity has changed since you were last screened, and you are at increased risk for chlamydia or gonorrhea. Ask your health care provider if you are at risk.  Ask your health care provider about whether you are at high risk for HIV. Your health care provider may recommend a prescription medicine to help prevent HIV infection. If you choose to take medicine to prevent HIV, you should first get tested for HIV. You should then be tested every 3 months for as long as you are taking the medicine. Follow these instructions at home: Lifestyle  Do not use any products that contain nicotine or tobacco, such as cigarettes, e-cigarettes, and chewing tobacco. If you need help quitting, ask your health care provider.  Do not use street drugs.  Do not share needles.  Ask your health care provider for help if you need support or information about quitting drugs. Alcohol use  Do not drink alcohol if your health care provider tells you not to drink.  If you drink alcohol: ? Limit how much you have to 0-2 drinks a day. ? Be aware of how much alcohol is in your drink. In the [place] one drink equals one 12 oz bottle of beer (355 mL), one 5 oz glass of wine (148 mL), or one 1 oz glass of hard liquor (44 mL). General instructions  Schedule regular health, dental, and eye exams.  Stay current with your vaccines.  Tell your health care provider if: ? You often feel depressed. ? You have ever been abused or do not feel safe at  home. Summary  Adopting a healthy lifestyle and getting preventive care are important in promoting health and wellness.  Follow your health care provider's instructions about healthy diet, exercising, and getting tested or screened for diseases.  Follow your health care provider's instructions on monitoring your cholesterol and blood pressure. This information is not intended to replace advice given to you by your health care provider. Make sure you discuss any questions you have with your health care provider. Document Released: 06/10/2008 Document Revised: 12/06/2018 Document Reviewed: 12/06/2018 Elsevier Patient Education  2020 [employer].

## 2019-12-09 NOTE — Progress Notes (Signed)
Subjective:    Patient ID: Brian Brian [name], male    DOB: [birth year], 73 y.o.   MRN: [id number]  HPI He is here for a physical exam.   He feels the foods stops at the lower esophagus.  Denies GERD.  If he does not chew well and pay attention to how he is eating this will occur frequently.  It occurs with meat or any food with substance.  He does not have the symptoms with liquid foods or foods like cereal with milk.  His mother did tell him when he was a baby there was an issue with blockage when he was drinking milk.  No procedure was needed and he has not had any issues since then up until about 5 years ago.  He would like to have this further evaluated.  His BP at home has been controlled.  Recently he has been taking it and it has been 135/78 on average.     Medications and allergies reviewed with patient and updated if appropriate.  Patient Active Problem List   Diagnosis Date Noted  . Allergic rhinitis 11/23/2016  . BPV (benign positional vertigo) 11/19/2015  . Multiple food allergies   . 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 History of prostate cancer 03/20/2012  . Diverticulosis of large intestine 12/02/2008  . History of colonic polyps 12/02/2008  . Low HDL (under 40) 02/12/2008  . Headache 02/12/2008    Brian [name] Outpatient Medications on File Prior to Visit  Medication Sig Dispense Refill  . EPIPEN 2-PAK 0.3 MG/0.3ML SOAJ injection Inject 1 mg into the muscle as needed.    . fish oil-omega-3 fatty acids 1000 MG capsule Take 1 g by mouth daily. Takes 500 mg daily    . glucosamine-chondroitin 500-400 MG tablet Take 1 tablet by mouth daily.     . LOW-DOSE ASPIRIN PO 81 mg.    . Multiple Vitamins-Minerals (MACUVITE PO) Take by mouth.    Marland Kitchen PRESCRIPTION MEDICATION Inject into the muscle every 30 (thirty) days. ALLERGY INJECTION'S MONTHLY    . SUMAtriptan (IMITREX) 100 MG tablet  Take 25 mg by mouth every 2 (two) hours as needed.     . vardenafil (LEVITRA) 20 MG tablet Take 1 tablet (20 mg total) by mouth daily as needed. Takes 5 mg daily (cuts tablet in 1/4) 10 tablet 2  . vitamin C (ASCORBIC ACID) 500 MG tablet Take 500 mg by mouth daily.      No Brian [name] facility-administered medications on file prior to visit.    Past Medical History:  Diagnosis Date  . Arthritis LOW BACK AND KNEES  . Hypertension   . Migraine headache    Dr Domingo Cocking  . Multiple food allergies   . Nocturia   . Prostate cancer ([place]) 06/14/12   Gleason 3+3=6, volume 33.84 cc  . Seasonal allergies    on immunotherapy    Past Surgical History:  Procedure Laterality Date  . BIOPSY PROSTATE  06/14/2012   [facility] OFFICE  . COLONOSCOPY  05/29/2010    "A sessile polyp was found in the ascending colon.  It was 5 mm in size.   A sessile polyp was found the the distal transverse colon.  It was 3 mm in size.   Moderate diverticulosis was found in the sigmoid colon."   . CYSTOSCOPY  10/26/2012   Procedure: CYSTOSCOPY FLEXIBLE;  Surgeon: Malka So, MD;  Location:  [facility];  Service: Urology;  Laterality: N/A;  . HEMORRHOID SURGERY  05-19-2006   [place]  . POLYPECTOMY    . RADIOACTIVE SEED IMPLANT  10/26/2012   Procedure: RADIOACTIVE SEED IMPLANT;  Surgeon: Malka So, MD;  Location: [facility];  Service: Urology;  Laterality: N/A;  . TONSILLECTOMY  324   73 years old  . WRIST ARTHROSCOPY  03-27-2003   DEBRIDEMENT VOLAR ULNAR  & SCAPHOLUNATE LIGAMENT TEARS OF RIGHT WRIST    Social History   Socioeconomic History  . Marital status: Married    Spouse name: Not on file  . Number of children: Not on file  . Years of education: Not on file  . Highest education level: Not on file  Occupational History  . Occupation: OWNER    Employer: [employer]    Comment: Manufactures aircraft parts  Tobacco Use  . Smoking status: Never Smoker  . Smokeless tobacco: Never  Used  Substance and Sexual Activity  . Alcohol use: No  . Drug use: No  . Sexual activity: Yes  Other Topics Concern  . Not on file  Social History Narrative   Married over 7 years      2 daughters      03/20/12 PSA 4.03   Social Determinants of Health   Financial Resource Strain:   . Difficulty of Paying Living Expenses: Not on file  Food Insecurity:   . Worried About Charity fundraiser in the Last Year: Not on file  . Ran Out of Food in the Last Year: Not on file  Transportation Needs:   . Lack of Transportation (Medical): Not on file  . Lack of Transportation (Non-Medical): Not on file  Physical Activity:   . Days of Exercise per Week: Not on file  . Minutes of Exercise per Session: Not on file  Stress:   . Feeling of Stress : Not on file  Social Connections:   . Frequency of Communication with Friends and Family: Not on file  . Frequency of Social Gatherings with Friends and Family: Not on file  . Attends Religious Services: Not on file  . Active Member of Clubs or Organizations: Not on file  . Attends Archivist Meetings: Not on file  . Marital Status: Not on file    Family History  Problem Relation Age of Onset  . Hypertension Father   . Heart failure Father   . Colon polyps Father   . Diabetes Mother   . Hypertension Mother   . Hypertension Maternal Grandmother   . Diabetes Maternal Grandmother   . Stroke Maternal Grandfather        > 55  . Bone cancer Paternal Grandfather   . Colon cancer Neg Hx   . Esophageal cancer Neg Hx   . Rectal cancer Neg Hx   . Stomach cancer Neg Hx     Review of Systems  Constitutional: Negative for chills and fever.  HENT: Positive for trouble swallowing.   Eyes: Negative for visual disturbance.  Respiratory: Negative for cough, shortness of breath and wheezing.   Cardiovascular: Positive for palpitations (occ, a couple of beats). Negative for chest pain.  Gastrointestinal: Negative for abdominal pain, blood  in stool, constipation, diarrhea and nausea.       No GERD  Genitourinary: Negative for difficulty urinating, dysuria and hematuria.  Musculoskeletal: Positive for back pain (mild lower back, occ). Negative for arthralgias.  Skin: Negative for color change and rash.  Neurological: Positive for headaches (occ). Negative for dizziness, light-headedness and numbness.  Psychiatric/Behavioral: Negative for dysphoric mood. The patient is not nervous/anxious.        Objective:   Vitals:   12/10/19 0802  BP: (!) 152/94  Pulse: (!) 52  Resp: 16  Temp: 97.8 F (36.6 C)  SpO2: 99%   Filed Weights   12/10/19 0802  Weight: 158 lb (71.7 kg)   Body mass index is 23.67 kg/m.  BP Readings from Last 3 Encounters:  12/10/19 (!) 152/94  12/06/18 126/84  11/24/17 (!) 144/82    Wt Readings from Last 3 Encounters:  12/10/19 158 lb (71.7 kg)  12/06/18 158 lb 12.8 oz (72 kg)  11/24/17 157 lb (71.2 kg)     Physical Exam Constitutional: He appears well-developed and well-nourished. No distress.  HENT:  Head: Normocephalic and atraumatic.  Right Ear: External ear normal.  Left Ear: External ear normal.  Mouth/Throat: Oropharynx is clear and moist.  Normal ear canals and TM b/l  Eyes: Conjunctivae and EOM are normal.  Neck: Neck supple. No tracheal deviation present. No thyromegaly present.  No carotid bruit  Cardiovascular: Normal rate, regular rhythm, normal heart sounds and intact distal pulses.   No murmur heard. Pulmonary/Chest: Effort normal and breath sounds normal. No respiratory distress. He has no wheezes. He has no rales.  Abdominal: Soft. He exhibits no distension. There is no tenderness.  Genitourinary: deferred  Musculoskeletal: He exhibits no edema.  Lymphadenopathy:   He has no cervical adenopathy.  Skin: Skin is warm and dry. He is not diaphoretic.  Psychiatric: He has a normal mood and affect. His behavior is normal.         Assessment & Plan:   Physical exam:  Screening blood work  ordered Immunizations  All up to date Colonoscopy   Up to date  - due in June Eye exams   Up to date  Exercise   regular Weight  Normal BMI Substance abuse   none Sees derm annually  See Problem List for Assessment and Plan of chronic medical problems.   This visit occurred during the SARS-CoV-2 public health emergency.  Safety protocols were in place, including screening questions prior to the visit, additional usage of staff PPE, and extensive cleaning of exam room while observing appropriate contact time as indicated for disinfecting solutions.    Follow-up in 1 year

## 2019-12-10 ENCOUNTER — Ambulatory Visit (INDEPENDENT_AMBULATORY_CARE_PROVIDER_SITE_OTHER): Payer: Medicare Other | Admitting: Internal Medicine

## 2019-12-10 ENCOUNTER — Encounter: Payer: Self-pay | Admitting: Internal Medicine

## 2019-12-10 ENCOUNTER — Other Ambulatory Visit (INDEPENDENT_AMBULATORY_CARE_PROVIDER_SITE_OTHER): Payer: Medicare Other

## 2019-12-10 ENCOUNTER — Other Ambulatory Visit: Payer: Self-pay

## 2019-12-10 VITALS — BP 152/94 | HR 52 | Temp 97.8°F | Resp 16 | Ht 68.5 in | Wt 158.0 lb

## 2019-12-10 DIAGNOSIS — R519 Headache, unspecified: Secondary | ICD-10-CM

## 2019-12-10 DIAGNOSIS — R1310 Dysphagia, unspecified: Secondary | ICD-10-CM | POA: Diagnosis not present

## 2019-12-10 DIAGNOSIS — Z0000 Encounter for general adult medical examination without abnormal findings: Secondary | ICD-10-CM | POA: Diagnosis not present

## 2019-12-10 DIAGNOSIS — Z8546 Personal history of malignant neoplasm of prostate: Secondary | ICD-10-CM

## 2019-12-10 LAB — COMPREHENSIVE METABOLIC PANEL
ALT: 15 U/L (ref 0–53)
AST: 17 U/L (ref 0–37)
Albumin: 4.5 g/dL (ref 3.5–5.2)
Alkaline Phosphatase: 77 U/L (ref 39–117)
BUN: 14 mg/dL (ref 6–23)
CO2: 26 mEq/L (ref 19–32)
Calcium: 9.2 mg/dL (ref 8.4–10.5)
Chloride: 108 mEq/L (ref 96–112)
Creatinine, Ser: 0.95 mg/dL (ref 0.40–1.50)
GFR: 78.06 mL/min (ref >60.00)
Glucose, Bld: 89 mg/dL (ref 70–99)
Potassium: 4 mEq/L (ref 3.5–5.1)
Sodium: 141 mEq/L (ref 135–145)
Total Bilirubin: 1.1 mg/dL (ref 0.2–1.2)
Total Protein: 6.6 g/dL (ref 6.0–8.3)

## 2019-12-10 LAB — CBC WITH DIFFERENTIAL/PLATELET
Basophils Absolute: 0.1 10*3/uL (ref 0.0–0.1)
Basophils Relative: 1.1 % (ref 0.0–3.0)
Eosinophils Absolute: 0.1 10*3/uL (ref 0.0–0.7)
Eosinophils Relative: 1.8 % (ref 0.0–5.0)
HCT: 41.3 % (ref 39.0–52.0)
Hemoglobin: 13.7 g/dL (ref 13.0–17.0)
Lymphocytes Relative: 23.4 % (ref 12.0–46.0)
Lymphs Abs: 1.3 10*3/uL (ref 0.7–4.0)
MCHC: 33.2 g/dL (ref 30.0–36.0)
MCV: 88.9 fl (ref 78.0–100.0)
Monocytes Absolute: 0.4 10*3/uL (ref 0.1–1.0)
Monocytes Relative: 8.2 % (ref 3.0–12.0)
Neutro Abs: 3.5 10*3/uL (ref 1.4–7.7)
Neutrophils Relative %: 65.5 % (ref 43.0–77.0)
Platelets: 197 10*3/uL (ref 150.0–400.0)
RBC: 4.65 Mil/uL (ref 4.22–5.81)
RDW: 13.9 % (ref 11.5–15.5)
WBC: 5.4 10*3/uL (ref 4.0–10.5)

## 2019-12-10 LAB — LIPID PANEL
Cholesterol: 153 mg/dL (ref 0–200)
HDL: 39.5 mg/dL (ref >39.00)
LDL Cholesterol: 96 mg/dL (ref 0–99)
NonHDL: 113.75
Total CHOL/HDL Ratio: 4
Triglycerides: 88 mg/dL (ref 0.0–149.0)
VLDL: 17.6 mg/dL (ref 0.0–40.0)

## 2019-12-10 LAB — TSH: TSH: 3.05 u[IU]/mL (ref 0.35–4.50)

## 2019-12-10 NOTE — Assessment & Plan Note (Signed)
Up-to-date with urology

## 2019-12-10 NOTE — Assessment & Plan Note (Signed)
For approximately 5 years has been experiencing difficulty swallowing solid foods-foods tend to get stuck in lower esophagus, but eventually will pass through Symptoms can be avoided if he chews very thoroughly and is careful when he eats Denies GERD Would like this further evaluated ?  Tightening of LES Referred to Dr. Fuller Plan

## 2019-12-10 NOTE — Assessment & Plan Note (Signed)
Sees Dr. Tommi Rumps Has approximately 1 migraine a month and takes sumatriptan 25 mg as needed, which is very effective

## 2019-12-11 ENCOUNTER — Encounter: Payer: Self-pay | Admitting: Internal Medicine

## 2020-01-14 DIAGNOSIS — Z5189 Encounter for other specified aftercare: Secondary | ICD-10-CM | POA: Diagnosis not present

## 2020-01-24 ENCOUNTER — Ambulatory Visit: Payer: Medicare Other

## 2020-02-01 ENCOUNTER — Ambulatory Visit: Payer: Medicare PPO | Attending: Internal Medicine

## 2020-02-01 DIAGNOSIS — Z23 Encounter for immunization: Secondary | ICD-10-CM

## 2020-02-01 NOTE — Progress Notes (Signed)
   Covid-19 Vaccination Clinic  Name:  Brian [name]    MRN: [id number] DOB: 06/11/1946  02/01/2020  [name] was observed post Covid-19 immunization for 15 minutes without incidence. He was provided with Vaccine Information Sheet and instruction to access the V-Safe system.   Mr. Mom was instructed to call 911 with any severe reactions post vaccine: Marland Kitchen Difficulty breathing  . Swelling of your face and throat  . A fast heartbeat  . A bad rash all over your body  . Dizziness and weakness    Immunizations Administered    Name Date Dose VIS Date Route   Pfizer COVID-19 Vaccine 02/01/2020  5:00 PM 0.3 mL 12/07/2019 Intramuscular   Manufacturer: [place]   Lot: [id number]   [place]: [id number]

## 2020-02-04 ENCOUNTER — Ambulatory Visit: Payer: Medicare Other

## 2020-02-08 DIAGNOSIS — Z5189 Encounter for other specified aftercare: Secondary | ICD-10-CM | POA: Diagnosis not present

## 2020-02-26 ENCOUNTER — Ambulatory Visit: Payer: Medicare PPO | Attending: Internal Medicine

## 2020-02-26 DIAGNOSIS — Z23 Encounter for immunization: Secondary | ICD-10-CM | POA: Insufficient documentation

## 2020-02-26 NOTE — Progress Notes (Signed)
   Covid-19 Vaccination Clinic  Name:  Brian [name]    MRN: [id number] DOB: 12/03/1946  02/26/2020  [name] was observed post Covid-19 immunization for 15 minutes without incident. He was provided with Vaccine Information Sheet and instruction to access the V-Safe system.   [name] was instructed to call 911 with any severe reactions post vaccine: Marland Kitchen Difficulty breathing  . Swelling of face and throat  . A fast heartbeat  . A bad rash all over body  . Dizziness and weakness   Immunizations Administered    Name Date Dose VIS Date Route   Pfizer COVID-19 Vaccine 02/26/2020  4:18 PM 0.3 mL 12/07/2019 Intramuscular   Manufacturer: [place]   Lot: [id number]   [place]: [id number]

## 2020-03-05 DIAGNOSIS — H2513 Age-related nuclear cataract, bilateral: Secondary | ICD-10-CM | POA: Diagnosis not present

## 2020-03-05 DIAGNOSIS — H25813 Combined forms of age-related cataract, bilateral: Secondary | ICD-10-CM | POA: Diagnosis not present

## 2020-03-07 DIAGNOSIS — Z5189 Encounter for other specified aftercare: Secondary | ICD-10-CM | POA: Diagnosis not present

## 2020-03-13 ENCOUNTER — Encounter: Payer: Self-pay | Admitting: Gastroenterology

## 2020-03-13 DIAGNOSIS — J3089 Other allergic rhinitis: Secondary | ICD-10-CM | POA: Diagnosis not present

## 2020-03-18 DIAGNOSIS — D225 Melanocytic nevi of trunk: Secondary | ICD-10-CM | POA: Diagnosis not present

## 2020-04-04 DIAGNOSIS — Z5189 Encounter for other specified aftercare: Secondary | ICD-10-CM | POA: Diagnosis not present

## 2020-04-07 DIAGNOSIS — Z5189 Encounter for other specified aftercare: Secondary | ICD-10-CM | POA: Diagnosis not present

## 2020-04-11 DIAGNOSIS — Z5189 Encounter for other specified aftercare: Secondary | ICD-10-CM | POA: Diagnosis not present

## 2020-04-14 DIAGNOSIS — Z5189 Encounter for other specified aftercare: Secondary | ICD-10-CM | POA: Diagnosis not present

## 2020-04-16 ENCOUNTER — Other Ambulatory Visit: Payer: Self-pay

## 2020-04-16 ENCOUNTER — Ambulatory Visit: Payer: Medicare PPO | Admitting: Gastroenterology

## 2020-04-16 ENCOUNTER — Encounter: Payer: Self-pay | Admitting: Gastroenterology

## 2020-04-16 VITALS — BP 132/80 | HR 55 | Temp 97.4°F | Ht 68.0 in | Wt 154.5 lb

## 2020-04-16 DIAGNOSIS — R1319 Other dysphagia: Secondary | ICD-10-CM

## 2020-04-16 DIAGNOSIS — R1310 Dysphagia, unspecified: Secondary | ICD-10-CM

## 2020-04-16 DIAGNOSIS — Z86010 Personal history of colonic polyps: Secondary | ICD-10-CM | POA: Diagnosis not present

## 2020-04-16 MED ORDER — NA SULFATE-K SULFATE-MG SULF 17.5-3.13-1.6 GM/177ML PO SOLN: 1.0000 | Freq: Once | ORAL | 0 refills | Status: AC

## 2020-04-16 NOTE — Progress Notes (Signed)
History of Present Illness: This is a 74 year old male referred by Binnie Rail, MD for the evaluation of a personal history of adenomatous colon polyps and dysphagia.  He relates a 6 to 8-year history of intermittent solid food dysphagia particularly with meats and breads.  Symptoms occurred very infrequently and have slightly progressed in frequency over the past couple years.  No reflux symptoms.  He notes occasional small amounts of bright red blood with bowel movements.  No other GI complaints.  Denies weight loss, abdominal pain, constipation, diarrhea, change in stool caliber, melena, nausea, vomiting, reflux symptoms, chest pain.   Colonoscopy 05/2015 1. Sessile polyp in the ascending colon; polypectomy performed with cold forceps 2. 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 Moderate diverticulosis noted in the sigmoid colon and descending colon 4. Grade l internal hemorrhoids   No Known Allergies Outpatient Medications Prior to Visit  Medication Sig Dispense Refill  . EPIPEN 2-PAK 0.3 MG/0.3ML SOAJ injection Inject 1 mg into the muscle as needed.    . fish oil-omega-3 fatty acids 1000 MG capsule Take 1 g by mouth daily. Takes 500 mg daily    . glucosamine-chondroitin 500-400 MG tablet Take 1 tablet by mouth daily.     . LOW-DOSE ASPIRIN PO 81 mg.    . Multiple Vitamins-Minerals (MACUVITE PO) Take by mouth.    Marland Kitchen PRESCRIPTION MEDICATION Inject into the muscle every 30 (thirty) days. ALLERGY INJECTION'S MONTHLY    . SUMAtriptan (IMITREX) 100 MG tablet Take 25 mg by mouth every 2 (two) hours as needed.     . vardenafil (LEVITRA) 20 MG tablet Take 1 tablet (20 mg total) by mouth daily as needed. Takes 5 mg daily (cuts tablet in 1/4) 10 tablet 2  . vitamin C (ASCORBIC ACID) 500 MG tablet Take 500 mg by mouth daily.      No facility-administered medications prior to visit.   Past Medical History:  Diagnosis Date  . Arthritis LOW BACK AND KNEES  . Hypertension   .  Migraine headache    Dr Domingo Cocking  . Multiple food allergies   . Nocturia   . Prostate cancer ([place]) 06/14/12   Gleason 3+3=6, volume 33.84 cc  . 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 Tubular adenoma of colon 2011   Past Surgical History:  Procedure Laterality Date  . BIOPSY PROSTATE  06/14/2012   [facility]  . COLONOSCOPY  05/29/2010    "A sessile polyp was found in the ascending colon.  It was 5 mm in size.   A sessile polyp was found the the distal transverse colon.  It was 3 mm in size.   Moderate diverticulosis was found in the sigmoid colon."   . CYSTOSCOPY  10/26/2012   Procedure: CYSTOSCOPY FLEXIBLE;  Surgeon: Malka So, MD;  Location: [facility];  Service: Urology;  Laterality: N/A;  . HEMORRHOID SURGERY  05-19-2006   [place]  . POLYPECTOMY    . RADIOACTIVE SEED IMPLANT  10/26/2012   Procedure: RADIOACTIVE SEED IMPLANT;  Surgeon: Malka So, MD;  Location: [facility];  Service: Urology;  Laterality: N/A;  . TONSILLECTOMY  5561   74 years old  . WRIST ARTHROSCOPY  03-27-2003   DEBRIDEMENT VOLAR ULNAR  & SCAPHOLUNATE LIGAMENT TEARS OF RIGHT WRIST   Social History   Socioeconomic History  . Marital status: Married    Spouse name: Not on file  . Number of children: Not on file  .  Years of education: Not on file  . Highest education level: Not on file  Occupational History  . Occupation: OWNER    Employer: [employer]    Comment: Manufactures aircraft parts  Tobacco Use  . Smoking status: Never Smoker  . Smokeless tobacco: Never Used  Substance and Sexual Activity  . Alcohol use: No  . Drug use: No  . Sexual activity: Yes  Other Topics Concern  . Not on file  Social History Narrative   Married over 74 years      2 daughters      03/20/12 PSA 4.03   Social Determinants of Health   Financial Resource Strain:   . Difficulty of Paying Living Expenses:   Food Insecurity:   . Worried About Charity fundraiser in the Last  Year:   . Arboriculturist in the Last Year:   Transportation Needs:   . Film/video editor (Medical):   Marland Kitchen Lack of Transportation (Non-Medical):   Physical Activity:   . Days of Exercise per Week:   . Minutes of Exercise per Session:   Stress:   . Feeling of Stress :   Social Connections:   . Frequency of Communication with Friends and Family:   . Frequency of Social Gatherings with Friends and Family:   . Attends Religious Services:   . Active Member of Clubs or Organizations:   . Attends Archivist Meetings:   Marland Kitchen Marital Status:    Family History  Problem Relation Age of Onset  . Hypertension Father   . Heart failure Father   . Colon polyps Father   . Diabetes Mother   . Hypertension Mother   . Hypertension Maternal Grandmother   . Diabetes Maternal Grandmother   . Stroke Maternal Grandfather        > 55  . Bone cancer Paternal Grandfather   . Colon cancer Neg Hx   . Esophageal cancer Neg Hx   . Rectal cancer Neg Hx   . Stomach cancer Neg Hx        Review of Systems: Pertinent positive and negative review of systems were noted in the above HPI section. All other review of systems were otherwise negative.   Physical Exam: General: Well developed, well nourished, no acute distress Head: Normocephalic and atraumatic Eyes:  sclerae anicteric, EOMI Ears: Normal auditory acuity Mouth: Not examined, mask on during Covid-19 pandemic Neck: Supple, no masses or thyromegaly Lungs: Clear throughout to auscultation Heart: Regular rate and rhythm; no murmurs, rubs or bruits Abdomen: Soft, non tender and non distended. No masses, hepatosplenomegaly or hernias noted. Normal Bowel sounds Rectal: Deferred to colonoscopy Musculoskeletal: Symmetrical with no gross deformities  Skin: No lesions on visible extremities Pulses:  Normal pulses noted Extremities: No clubbing, cyanosis, edema or deformities noted Neurological: Alert oriented x 4, grossly nonfocal Cervical  Nodes:  No significant cervical adenopathy Inguinal Nodes: No significant inguinal adenopathy Psychological:  Alert and cooperative. Normal mood and affect   Assessment and Recommendations:  1. Dysphagia. Solid foods for 6-8 years slighly progressing.  Rule out esophageal stricture, esophagitis, motility disorder, less likely neoplasm.  Schedule EGD with possible dilation. The risks (including bleeding, perforation, infection, missed lesions, medication reactions and possible hospitalization or surgery if complications occur), benefits, and alternatives to endoscopy with possible biopsy and possible dilation were discussed with the patient and they consent to proceed.   2. Personal history of adenomatous colon polyps. Family history of colon polyps. He is due  for surveillance. The risks (including bleeding, perforation, infection, missed lesions, medication reactions and possible hospitalization or surgery if complications occur), benefits, and alternatives to colonoscopy with possible biopsy and possible polypectomy were discussed with the patient and they consent to proceed.     cc: Binnie Rail, MD [address],  [place] [id number]

## 2020-04-16 NOTE — Patient Instructions (Signed)
You have been scheduled for an endoscopy and colonoscopy. Please follow the written instructions given to you at your visit today. Please pick up your prep supplies at the pharmacy within the next 1-3 days. If you use inhalers (even only as needed), please bring them with you on the day of your procedure.  Normal BMI (Body Mass Index- based on height and weight) is between 23 and 30. Your BMI today is Body mass index is 23.49 kg/m. Marland Kitchen Please consider follow up  regarding your BMI with your Primary Care Provider.  Thank you for choosing me and [facility].  Pricilla Riffle. Dagoberto Ligas., MD., [facility] Regal

## 2020-04-18 DIAGNOSIS — Z5189 Encounter for other specified aftercare: Secondary | ICD-10-CM | POA: Diagnosis not present

## 2020-05-16 DIAGNOSIS — Z5189 Encounter for other specified aftercare: Secondary | ICD-10-CM | POA: Diagnosis not present

## 2020-05-23 DIAGNOSIS — Z8546 Personal history of malignant neoplasm of prostate: Secondary | ICD-10-CM | POA: Diagnosis not present

## 2020-06-04 ENCOUNTER — Encounter: Payer: Self-pay | Admitting: Gastroenterology

## 2020-06-13 DIAGNOSIS — Z5189 Encounter for other specified aftercare: Secondary | ICD-10-CM | POA: Diagnosis not present

## 2020-06-17 ENCOUNTER — Ambulatory Visit (AMBULATORY_SURGERY_CENTER): Payer: Medicare PPO | Admitting: Gastroenterology

## 2020-06-17 ENCOUNTER — Encounter: Payer: Self-pay | Admitting: Gastroenterology

## 2020-06-17 ENCOUNTER — Other Ambulatory Visit: Payer: Self-pay

## 2020-06-17 VITALS — BP 97/55 | HR 44 | Temp 97.7°F | Resp 16 | Ht 68.0 in | Wt 154.0 lb

## 2020-06-17 DIAGNOSIS — Z86010 Personal history of colonic polyps: Secondary | ICD-10-CM

## 2020-06-17 DIAGNOSIS — K2950 Unspecified chronic gastritis without bleeding: Secondary | ICD-10-CM | POA: Diagnosis not present

## 2020-06-17 DIAGNOSIS — K449 Diaphragmatic hernia without obstruction or gangrene: Secondary | ICD-10-CM | POA: Diagnosis not present

## 2020-06-17 DIAGNOSIS — R1310 Dysphagia, unspecified: Secondary | ICD-10-CM | POA: Diagnosis not present

## 2020-06-17 DIAGNOSIS — K2970 Gastritis, unspecified, without bleeding: Secondary | ICD-10-CM | POA: Diagnosis not present

## 2020-06-17 DIAGNOSIS — K222 Esophageal obstruction: Secondary | ICD-10-CM | POA: Diagnosis not present

## 2020-06-17 DIAGNOSIS — R1319 Other dysphagia: Secondary | ICD-10-CM

## 2020-06-17 DIAGNOSIS — K2100 Gastro-esophageal reflux disease with esophagitis, without bleeding: Secondary | ICD-10-CM | POA: Diagnosis not present

## 2020-06-17 DIAGNOSIS — K635 Polyp of colon: Secondary | ICD-10-CM

## 2020-06-17 DIAGNOSIS — K319 Disease of stomach and duodenum, unspecified: Secondary | ICD-10-CM

## 2020-06-17 DIAGNOSIS — D123 Benign neoplasm of transverse colon: Secondary | ICD-10-CM

## 2020-06-17 DIAGNOSIS — K2980 Duodenitis without bleeding: Secondary | ICD-10-CM

## 2020-06-17 HISTORY — PX: COLONOSCOPY: SHX174

## 2020-06-17 HISTORY — PX: UPPER GASTROINTESTINAL ENDOSCOPY: SHX188

## 2020-06-17 MED ORDER — PANTOPRAZOLE SODIUM 40 MG PO TBEC: 40.0000 mg | DELAYED_RELEASE_TABLET | Freq: Every day | ORAL | 3 refills | Status: DC

## 2020-06-17 MED ORDER — SODIUM CHLORIDE 0.9 % IV SOLN: 500.0000 mL | Freq: Once | INTRAVENOUS | Status: DC

## 2020-06-17 NOTE — Progress Notes (Signed)
Called to room to assist during endoscopic procedure.  Patient ID and intended procedure confirmed with present staff. Received instructions for my participation in the procedure from the performing physician.  

## 2020-06-17 NOTE — Op Note (Signed)
[facility] Patient Name: Brian [name] Procedure Date: 06/17/2020 1:26 PM MRN: [id number] Endoscopist: Ladene Artist , MD Age: 74 Referring MD:  Date of Birth: [birth year] Gender: Male Account #: [id number] Procedure:                Colonoscopy Indications:              Surveillance: Personal history of adenomatous                            polyps on last colonoscopy 5 years ago Medicines:                Monitored Anesthesia Care Procedure:                Pre-Anesthesia Assessment:                           - Prior to the procedure, a History and Physical                            was performed, and patient medications and                            allergies were reviewed. The patient's tolerance of                            previous anesthesia was also reviewed. The risks                            and benefits of the procedure and the sedation                            options and risks were discussed with the patient.                            All questions were answered, and informed consent                            was obtained. Prior Anticoagulants: The patient has                            taken no previous anticoagulant or antiplatelet                            agents. ASA Grade Assessment: II - A patient with                            mild systemic disease. After reviewing the risks                            and benefits, the patient was deemed in                            satisfactory condition to undergo the procedure.  After obtaining informed consent, the colonoscope                            was passed under direct vision. Throughout the                            procedure, the patient's blood pressure, pulse, and                            oxygen saturations were monitored continuously. The                            Colonoscope was introduced through the anus and                            advanced to the the cecum,  identified by                            appendiceal orifice and ileocecal valve. The                            ileocecal valve, appendiceal orifice, and rectum                            were photographed. The quality of the bowel                            preparation was good. The colonoscopy was performed                            without difficulty. The patient tolerated the                            procedure well. Scope In: 1:39:53 PM Scope Out: 1:57:44 PM Scope Withdrawal Time: 0 hours 12 minutes 7 seconds  Total Procedure Duration: 0 hours 17 minutes 51 seconds  Findings:                 The perianal and digital rectal examinations were                            normal.                           Two sessile polyps were found in the transverse                            colon. The polyps were 7 to 8 mm in size. These                            polyps were removed with a cold snare. Resection                            and retrieval were complete.  Multiple medium-mouthed diverticula were found in                            the left colon. There was narrowing of the colon in                            association with the diverticular opening. There                            was evidence of diverticular spasm. There was no                            evidence of diverticular bleeding.                           External hemorrhoids were found during                            retroflexion. The hemorrhoids were medium-sized.                           The exam was otherwise without abnormality on                            direct and retroflexion views. Complications:            No immediate complications. Estimated blood loss:                            None. Estimated Blood Loss:     Estimated blood loss: none. Impression:               - Two 7 to 8 mm polyps in the transverse colon,                            removed with a cold snare. Resected and  retrieved.                           - Moderate diverticulosis in the left colon.                           - External hemorrhoids.                           - The examination was otherwise normal on direct                            and retroflexion views. Recommendation:           - Repeat colonoscopy after studies are complete for                            surveillance based on pathology results.                           - Patient has a contact number available for  emergencies. The signs and symptoms of potential                            delayed complications were discussed with the                            patient. Return to normal activities tomorrow.                            Written discharge instructions were provided to the                            patient.                           - High fiber diet.                           - Continue present medications.                           - Await pathology results. Ladene Artist, MD 06/17/2020 2:12:55 PM This report has been signed electronically.

## 2020-06-17 NOTE — Progress Notes (Signed)
pt tolerated well. VSS. awake and to recovery. Report given to RN. Bite block inserted and removed without truma. 

## 2020-06-17 NOTE — Op Note (Signed)
[facility] Patient Name: Brian [name] Procedure Date: 06/17/2020 1:26 PM MRN: [id number] Endoscopist: Ladene Artist , MD Age: 74 Referring MD:  Date of Birth: Feb 06, 1946 Gender: Male Account #: [id number] Procedure:                Upper GI endoscopy Indications:              Dysphagia Medicines:                Monitored Anesthesia Care Procedure:                Pre-Anesthesia Assessment:                           - Prior to the procedure, a History and Physical                            was performed, and patient medications and                            allergies were reviewed. The patient's tolerance of                            previous anesthesia was also reviewed. The risks                            and benefits of the procedure and the sedation                            options and risks were discussed with the patient.                            All questions were answered, and informed consent                            was obtained. Prior Anticoagulants: The patient has                            taken no previous anticoagulant or antiplatelet                            agents. ASA Grade Assessment: II - A patient with                            mild systemic disease. After reviewing the risks                            and benefits, the patient was deemed in                            satisfactory condition to undergo the procedure.                           - Prior to the procedure, a History and Physical  was performed, and patient medications and                            allergies were reviewed. The patient's tolerance of                            previous anesthesia was also reviewed. The risks                            and benefits of the procedure and the sedation                            options and risks were discussed with the patient.                            All questions were answered, and informed consent                             was obtained. Prior Anticoagulants: The patient has                            taken no previous anticoagulant or antiplatelet                            agents. ASA Grade Assessment: II - A patient with                            mild systemic disease. After reviewing the risks                            and benefits, the patient was deemed in                            satisfactory condition to undergo the procedure.                           After obtaining informed consent, the endoscope was                            passed under direct vision. Throughout the                            procedure, the patient's blood pressure, pulse, and                            oxygen saturations were monitored continuously. The                            Endoscope was introduced through the mouth, and                            advanced to the second part of duodenum. The upper  GI endoscopy was accomplished without difficulty.                            The patient tolerated the procedure well. Scope In: Scope Out: Findings:                 LA Grade B (one or more mucosal breaks greater than                            5 mm, not extending between the tops of two mucosal                            folds) esophagitis with no bleeding was found in                            the distal esophagus.                           One benign-appearing, intrinsic moderate stenosis                            was found 36 cm from the incisors. This stenosis                            measured 1.2 cm (inner diameter) x less than one cm                            (in length). The stenosis was traversed. A                            guidewire was placed and the scope was withdrawn.                            Dilation was performed with a Savary dilator with                            mild resistance at 14 mm.                           The exam of the esophagus was  otherwise normal.                           A medium-sized hiatal hernia was present.                           Patchy mildly erythematous mucosa without bleeding                            was found in the gastric body and in the gastric                            antrum. Biopsies were taken with a cold forceps for  histology.                           The exam of the stomach was otherwise normal.                           A few localized erosions without bleeding were                            found in the duodenal bulb.                           The exam of the duodenum was otherwise normal. Complications:            No immediate complications. Estimated Blood Loss:     Estimated blood loss was minimal. Impression:               - LA Grade B reflux esophagitis with no bleeding.                           - Benign-appearing esophageal stenosis. Dilated.                           - Medium-sized hiatal hernia.                           - Erythematous mucosa in the gastric body and                            antrum. Biopsied.                           - Duodenal erosions without bleeding. Recommendation:           - Patient has a contact number available for                            emergencies. The signs and symptoms of potential                            delayed complications were discussed with the                            patient. Return to normal activities tomorrow.                            Written discharge instructions were provided to the                            patient.                           - Clear liquid diet for 2 hours, then advance as                            tolerated to soft diet today. Resume prior diet  tomorrow.                           - Antireflux measures long term.                           - Pantoprazole 40 mg po qam, 1 year of refills.                           - Continue present medications.                            - Await pathology results.                           - Return to GI office in 6 weeks. Ladene Artist, MD 06/17/2020 2:18:01 PM This report has been signed electronically.

## 2020-06-17 NOTE — Progress Notes (Signed)
Pt's states no medical or surgical changes since previsit or office visit.  ° °Vitals CW °

## 2020-06-17 NOTE — Patient Instructions (Signed)
Information on polyps, diverticulosis, hiatal hernias, esophagitis and hemorrhoids given to you today.  Await pathology results.  Clear liquid diet for the next 2 hours - until 4:15pm today.  Soft diet the rest of today. Resume previous diet tomorrow (Wednesday).  Pantoprazole 40 mg by mouth every morning.  One year of refills.  Return to GI office in 6 weeks.  YOU HAD AN ENDOSCOPIC PROCEDURE TODAY AT [facility]:   Refer to the procedure report that was given to you for any specific questions about what was found during the examination.  If the procedure report does not answer your questions, please call your gastroenterologist to clarify.  If you requested that your care partner not be given the details of your procedure findings, then the procedure report has been included in a sealed envelope for you to review at your convenience later.  YOU SHOULD EXPECT: Some feelings of bloating in the abdomen. Passage of more gas than usual.  Walking can help get rid of the air that was put into your GI tract during the procedure and reduce the bloating. If you had a lower endoscopy (such as a colonoscopy or flexible sigmoidoscopy) you may notice spotting of blood in your stool or on the toilet paper. If you underwent a bowel prep for your procedure, you may not have a normal bowel movement for a few days.  Please Note:  You might notice some irritation and congestion in your nose or some drainage.  This is from the oxygen used during your procedure.  There is no need for concern and it should clear up in a day or so.  SYMPTOMS TO REPORT IMMEDIATELY:   Following lower endoscopy (colonoscopy or flexible sigmoidoscopy):  Excessive amounts of blood in the stool  Significant tenderness or worsening of abdominal pains  Swelling of the abdomen that is new, acute  Fever of 100F or higher   Following upper endoscopy (EGD)  Vomiting of blood or coffee ground material  New chest pain or  pain under the shoulder blades  Painful or persistently difficult swallowing  New shortness of breath  Fever of 100F or higher  Black, tarry-looking stools  For urgent or emergent issues, a gastroenterologist can be reached at any hour by calling [phone]. Do not use MyChart messaging for urgent concerns.    DIET:  We do recommend a small meal at first, but then you may proceed to your regular diet.  Drink plenty of fluids but you should avoid alcoholic beverages for 24 hours.  ACTIVITY:  You should plan to take it easy for the rest of today and you should NOT DRIVE or use heavy machinery until tomorrow (because of the sedation medicines used during the test).    FOLLOW UP: Our staff will call the number listed on your records 48-72 hours following your procedure to check on you and address any questions or concerns that you may have regarding the information given to you following your procedure. If we do not reach you, we will leave a message.  We will attempt to reach you two times.  During this call, we will ask if you have developed any symptoms of COVID 19. If you develop any symptoms (ie: fever, flu-like symptoms, shortness of breath, cough etc.) before then, please call [phone].  If you test positive for Covid 19 in the 2 weeks post procedure, please call and report this information to [place].    If any biopsies were taken you will  be contacted by phone or by letter within the next 1-3 weeks.  Please call us at [phone] if you have not heard about the biopsies in 3 weeks.    SIGNATURES/CONFIDENTIALITY: You and/or your care partner have signed paperwork which will be entered into your electronic medical record.  These signatures attest to the fact that that the information above on your After Visit Summary has been reviewed and is understood.  Full responsibility of the confidentiality of this discharge information lies with you and/or your care-partner.

## 2020-06-19 ENCOUNTER — Telehealth: Payer: Self-pay | Admitting: *Deleted

## 2020-06-19 NOTE — Telephone Encounter (Signed)
  Follow up Call-  Call back number 06/17/2020  Post procedure Call Back phone  # [phone]  Permission to leave phone message Yes  Some recent data might be hidden    LMOM to call back with any questions or concerns.  Also, call back if patient has developed fever, respiratory issues or been dx with COVID or had any family members or close contacts diagnosed since her procedure.

## 2020-06-25 ENCOUNTER — Encounter: Payer: Self-pay | Admitting: Gastroenterology

## 2020-07-11 DIAGNOSIS — Z5189 Encounter for other specified aftercare: Secondary | ICD-10-CM | POA: Diagnosis not present

## 2020-08-13 ENCOUNTER — Encounter: Payer: Self-pay | Admitting: Gastroenterology

## 2020-08-13 ENCOUNTER — Ambulatory Visit: Payer: Medicare PPO | Admitting: Gastroenterology

## 2020-08-13 VITALS — BP 130/86 | HR 64 | Ht 68.0 in | Wt 154.0 lb

## 2020-08-13 DIAGNOSIS — K2100 Gastro-esophageal reflux disease with esophagitis, without bleeding: Secondary | ICD-10-CM

## 2020-08-13 DIAGNOSIS — K222 Esophageal obstruction: Secondary | ICD-10-CM | POA: Diagnosis not present

## 2020-08-13 NOTE — Patient Instructions (Signed)
Finish taking Protonix. If you decide you want to switch to Nexium, please contact out office in 2-3 months.   Thank you for choosing me and [facility].  Pricilla Riffle. Dagoberto Ligas., MD., Marval Regal

## 2020-08-13 NOTE — Progress Notes (Signed)
    History of Present Illness: This is a 74 year old male returning for follow-up after EGD and colonoscopy as outlined below.  His dysphagia has completely resolved.  He has not noted reflux symptoms either prior to endoscopy or since.  He has significant concerns about PPI therapy and has questions about long-term management.  EGD 05/2020 - LA Grade B reflux esophagitis with no bleeding. - Benign-appearing esophageal stenosis. Dilated. - Medium-sized hiatal hernia. - Erythematous mucosa in the gastric body and antrum. Biopsied. - Duodenal erosions without bleedin  Colonoscopy 05/2020 - Two 7 to 8 mm polyps in the transverse colon, removed with a cold snare. Resected and retrieved. - Moderate diverticulosis in the left colon. - External hemorrhoids. - The examination was otherwise normal on direct and retroflexion views.  Current Medications, Allergies, Past Medical History, Past Surgical History, Family History and Social History were reviewed in [employer] record.   Physical Exam: General: Well developed, well nourished, no acute distress Head: Normocephalic and atraumatic Eyes:  sclerae anicteric, EOMI Ears: Normal auditory acuity Mouth: Not examined, mask on during Covid-19 pandemic Lungs: Clear throughout to auscultation Heart: Regular rate and rhythm; no murmurs, rubs or bruits Abdomen: Soft, non tender and non distended. No masses, hepatosplenomegaly or hernias noted. Normal Bowel sounds Rectal: Not done Musculoskeletal: Symmetrical with no gross deformities  Pulses:  Normal pulses noted Extremities: No clubbing, cyanosis, edema or deformities noted Neurological: Alert oriented x 4, grossly nonfocal Psychological:  Alert and cooperative. Normal mood and affect   Assessment and Recommendations:  1. GERD with LA class B erosive esophagitis. Esophageal stricture with dysphagia resolved post dilation.  After a long discussion addressing his  questions, concerns he is comfortable taking a PPI daily for now.  From information he has read he is most comfortable taking Nexium 40 mg daily.  As he already has a prescription of pantoprazole 40 mg daily he will complete his current 67-month prescription and then likely will change to Nexium 40 mg p.o. every morning.  He may decide to try famotidine 10 to 20 mg p.o. twice daily or famotidine 40 mg daily at some point in the future.  He is advised to call if his dysphagia returns otherwise REV in 1 year.   2.  Personal history of adenomatous colon polyps and sessile serrated polyps.  A 5-year interval surveillance colonoscopy is recommended in June 2026.

## 2020-08-15 DIAGNOSIS — Z5189 Encounter for other specified aftercare: Secondary | ICD-10-CM | POA: Diagnosis not present

## 2020-09-04 DIAGNOSIS — R972 Elevated prostate specific antigen [PSA]: Secondary | ICD-10-CM | POA: Diagnosis not present

## 2020-09-08 DIAGNOSIS — N5201 Erectile dysfunction due to arterial insufficiency: Secondary | ICD-10-CM | POA: Diagnosis not present

## 2020-09-08 DIAGNOSIS — Z8546 Personal history of malignant neoplasm of prostate: Secondary | ICD-10-CM | POA: Diagnosis not present

## 2020-09-12 DIAGNOSIS — Z5189 Encounter for other specified aftercare: Secondary | ICD-10-CM | POA: Diagnosis not present

## 2020-09-27 ENCOUNTER — Other Ambulatory Visit: Payer: Self-pay

## 2020-09-27 ENCOUNTER — Emergency Department: Payer: Medicare PPO

## 2020-09-27 ENCOUNTER — Observation Stay
Admission: EM | Admit: 2020-09-27 | Discharge: 2020-09-28 | Disposition: A | Discharge status: Home / Self Care | Payer: Medicare PPO | Attending: Hospitalist | Admitting: Hospitalist

## 2020-09-27 ENCOUNTER — Encounter: Payer: Self-pay | Admitting: Emergency Medicine

## 2020-09-27 DIAGNOSIS — R531 Weakness: Secondary | ICD-10-CM | POA: Diagnosis not present

## 2020-09-27 DIAGNOSIS — I10 Essential (primary) hypertension: Secondary | ICD-10-CM | POA: Diagnosis not present

## 2020-09-27 DIAGNOSIS — R197 Diarrhea, unspecified: Secondary | ICD-10-CM | POA: Diagnosis not present

## 2020-09-27 DIAGNOSIS — Z8546 Personal history of malignant neoplasm of prostate: Secondary | ICD-10-CM

## 2020-09-27 DIAGNOSIS — R000 Tachycardia, unspecified: Secondary | ICD-10-CM | POA: Diagnosis not present

## 2020-09-27 DIAGNOSIS — E860 Dehydration: Secondary | ICD-10-CM

## 2020-09-27 DIAGNOSIS — D72829 Elevated white blood cell count, unspecified: Secondary | ICD-10-CM

## 2020-09-27 DIAGNOSIS — R112 Nausea with vomiting, unspecified: Secondary | ICD-10-CM | POA: Diagnosis present

## 2020-09-27 DIAGNOSIS — E872 Acidosis, unspecified: Secondary | ICD-10-CM

## 2020-09-27 DIAGNOSIS — Z79899 Other long term (current) drug therapy: Secondary | ICD-10-CM | POA: Insufficient documentation

## 2020-09-27 DIAGNOSIS — R0602 Shortness of breath: Secondary | ICD-10-CM

## 2020-09-27 DIAGNOSIS — A419 Sepsis, unspecified organism: Principal | ICD-10-CM | POA: Diagnosis present

## 2020-09-27 DIAGNOSIS — K5939 Other megacolon: Secondary | ICD-10-CM | POA: Diagnosis not present

## 2020-09-27 DIAGNOSIS — K529 Noninfective gastroenteritis and colitis, unspecified: Secondary | ICD-10-CM | POA: Insufficient documentation

## 2020-09-27 DIAGNOSIS — R1110 Vomiting, unspecified: Secondary | ICD-10-CM | POA: Diagnosis not present

## 2020-09-27 DIAGNOSIS — J9811 Atelectasis: Secondary | ICD-10-CM | POA: Diagnosis not present

## 2020-09-27 DIAGNOSIS — Z20822 Contact with and (suspected) exposure to covid-19: Secondary | ICD-10-CM | POA: Insufficient documentation

## 2020-09-27 DIAGNOSIS — R5381 Other malaise: Secondary | ICD-10-CM | POA: Diagnosis not present

## 2020-09-27 DIAGNOSIS — R509 Fever, unspecified: Secondary | ICD-10-CM

## 2020-09-27 DIAGNOSIS — R109 Unspecified abdominal pain: Secondary | ICD-10-CM | POA: Diagnosis not present

## 2020-09-27 LAB — CBC
HCT: 44.8 % (ref 39.0–52.0)
Hemoglobin: 15.7 g/dL (ref 13.0–17.0)
MCH: 29.2 pg (ref 26.0–34.0)
MCHC: 35 g/dL (ref 30.0–36.0)
MCV: 83.4 fL (ref 80.0–100.0)
Platelets: 224 10*3/uL (ref 150–400)
RBC: 5.37 MIL/uL (ref 4.22–5.81)
RDW: 13.2 % (ref 11.5–15.5)
WBC: 14.7 10*3/uL — ABNORMAL HIGH (ref 4.0–10.5)
nRBC: 0 % (ref 0.0–0.2)

## 2020-09-27 LAB — COMPREHENSIVE METABOLIC PANEL
ALT: 16 U/L (ref 0–44)
AST: 22 U/L (ref 15–41)
Albumin: 5 g/dL (ref 3.5–5.0)
Alkaline Phosphatase: 75 U/L (ref 38–126)
Anion gap: 10 (ref 5–15)
BUN: 18 mg/dL (ref 8–23)
CO2: 23 mmol/L (ref 22–32)
Calcium: 10.6 mg/dL — ABNORMAL HIGH (ref 8.9–10.3)
Chloride: 101 mmol/L (ref 98–111)
Creatinine, Ser: 1.09 mg/dL (ref 0.61–1.24)
GFR calc Af Amer: >60 mL/min (ref >60)
GFR calc non Af Amer: >60 mL/min (ref >60)
Glucose, Bld: 135 mg/dL — ABNORMAL HIGH (ref 70–99)
Potassium: 3.8 mmol/L (ref 3.5–5.1)
Sodium: 134 mmol/L — ABNORMAL LOW (ref 135–145)
Total Bilirubin: 1.7 mg/dL — ABNORMAL HIGH (ref 0.3–1.2)
Total Protein: 7.7 g/dL (ref 6.5–8.1)

## 2020-09-27 LAB — URINALYSIS, COMPLETE (UACMP) WITH MICROSCOPIC
Bacteria, UA: NONE SEEN
Bilirubin Urine: NEGATIVE
Glucose, UA: NEGATIVE mg/dL
Hgb urine dipstick: NEGATIVE
Ketones, ur: 20 mg/dL — AB
Leukocytes,Ua: NEGATIVE
Nitrite: NEGATIVE
Protein, ur: 30 mg/dL — AB
Specific Gravity, Urine: 1.025 (ref 1.005–1.030)
pH: 5 (ref 5.0–8.0)

## 2020-09-27 LAB — LACTIC ACID, PLASMA
Lactic Acid, Venous: 2.4 mmol/L (ref 0.5–1.9)
Lactic Acid, Venous: 1.4 mmol/L (ref 0.5–1.9)

## 2020-09-27 LAB — TROPONIN I (HIGH SENSITIVITY)
Troponin I (High Sensitivity): 6 ng/L (ref <18)
Troponin I (High Sensitivity): 12 ng/L (ref <18)

## 2020-09-27 LAB — LIPASE, BLOOD: Lipase: 29 U/L (ref 11–51)

## 2020-09-27 LAB — RESPIRATORY PANEL BY RT PCR (FLU A&B, COVID)
Influenza A by PCR: NEGATIVE
Influenza B by PCR: NEGATIVE
SARS Coronavirus 2 by RT PCR: NEGATIVE

## 2020-09-27 LAB — MAGNESIUM: Magnesium: 1.5 mg/dL — ABNORMAL LOW (ref 1.7–2.4)

## 2020-09-27 LAB — PROCALCITONIN: Procalcitonin: <0.1 ng/mL

## 2020-09-27 MED ORDER — ONDANSETRON HCL 4 MG/2ML IJ SOLN
4.0000 mg | Freq: Once | INTRAMUSCULAR | Status: AC
  Administered 2020-09-27: 4 mg via INTRAVENOUS
  Filled 2020-09-27: qty 2

## 2020-09-27 MED ORDER — LACTATED RINGERS IV SOLN: INTRAVENOUS | Status: DC

## 2020-09-27 MED ORDER — PIPERACILLIN-TAZOBACTAM 3.375 G IVPB 30 MIN
3.3750 g | Freq: Once | INTRAVENOUS | Status: AC
  Administered 2020-09-27: 3.375 g via INTRAVENOUS
  Filled 2020-09-27: qty 50

## 2020-09-27 MED ORDER — SODIUM CHLORIDE 0.9 % IV BOLUS (SEPSIS)
1000.0000 mL | Freq: Once | INTRAVENOUS | Status: AC
  Administered 2020-09-27: 1000 mL via INTRAVENOUS

## 2020-09-27 MED ORDER — ENOXAPARIN SODIUM 40 MG/0.4ML ~~LOC~~ SOLN: 40.0000 mg | SUBCUTANEOUS | Status: DC

## 2020-09-27 MED ORDER — PIPERACILLIN-TAZOBACTAM 3.375 G IVPB
3.3750 g | Freq: Three times a day (TID) | INTRAVENOUS | Status: DC
  Administered 2020-09-28: 3.375 g via INTRAVENOUS
  Filled 2020-09-27: qty 50

## 2020-09-27 MED ORDER — SODIUM CHLORIDE 0.9 % IV BOLUS (SEPSIS)
250.0000 mL | Freq: Once | INTRAVENOUS | Status: AC
  Administered 2020-09-27: 250 mL via INTRAVENOUS

## 2020-09-27 MED ORDER — IOHEXOL 300 MG/ML  SOLN
100.0000 mL | Freq: Once | INTRAMUSCULAR | Status: AC | PRN
  Administered 2020-09-27: 100 mL via INTRAVENOUS

## 2020-09-27 MED ORDER — ACETAMINOPHEN 650 MG RE SUPP: 650.0000 mg | Freq: Four times a day (QID) | RECTAL | Status: DC | PRN

## 2020-09-27 MED ORDER — ACETAMINOPHEN 500 MG PO TABS
ORAL_TABLET | ORAL | Status: AC
  Administered 2020-09-27: 1000 mg via ORAL
  Filled 2020-09-27: qty 2

## 2020-09-27 MED ORDER — SODIUM CHLORIDE 0.9 % IV SOLN: 500.0000 mg | INTRAVENOUS | Status: DC

## 2020-09-27 MED ORDER — SODIUM CHLORIDE 0.9 % IV SOLN
8.0000 mg | Freq: Once | INTRAVENOUS | Status: DC
  Filled 2020-09-27: qty 4

## 2020-09-27 MED ORDER — PANTOPRAZOLE SODIUM 40 MG IV SOLR
40.0000 mg | Freq: Every day | INTRAVENOUS | Status: DC
  Administered 2020-09-27: 40 mg via INTRAVENOUS
  Filled 2020-09-27: qty 40

## 2020-09-27 MED ORDER — ACETAMINOPHEN 325 MG PO TABS: 650.0000 mg | ORAL_TABLET | Freq: Four times a day (QID) | ORAL | Status: DC | PRN

## 2020-09-27 MED ORDER — ACETAMINOPHEN 500 MG PO TABS: 1000.0000 mg | ORAL_TABLET | Freq: Once | ORAL | Status: AC

## 2020-09-27 MED ORDER — SODIUM CHLORIDE 0.9 % IV SOLN: 2.0000 g | INTRAVENOUS | Status: DC

## 2020-09-27 NOTE — ED Notes (Signed)
Pt resting at this time with no needs expressed.

## 2020-09-27 NOTE — ED Notes (Signed)
Date and time results received: 09/27/20 2130 (use smartphrase ".now" to insert current time)  Test: Lactic Acid Critical Value: 2.4  Name of Provider Notified: Dr. Tamala Julian  Orders Received? Or Actions Taken?: provider notified

## 2020-09-27 NOTE — ED Triage Notes (Signed)
Pt here for vomiting.  Started today. Denies pain, only c/o nausea.  Pt had significant episode of projectile vomiting, enough to cover large section of triage floor. Was given 200 ML NS by EMS, PIV by EMS and 4 mg zofran.

## 2020-09-27 NOTE — Consult Note (Signed)
CODE SEPSIS - PHARMACY COMMUNICATION  **Broad Spectrum Antibiotics should be administered within 1 hour of Sepsis diagnosis**  Time Code Sepsis Called/Page Received: 2004  Antibiotics Ordered: Zosyn  Time of 1st antibiotic administration: 2124  Additional action taken by pharmacy: n/a  If necessary, Name of Provider/Nurse Contacted: none    Lu Duffel ,PharmD Clinical Pharmacist  09/27/2020  8:10 PM

## 2020-09-27 NOTE — H&P (Signed)
History and Physical    Brian [name]:[id number] DOB: 1946/02/29 DOA: 09/27/2020  PCP: Binnie Rail, MD    Patient coming from: Home   Chief Complaint: Nausea and vomiting   HPI: Brian [name] is a 74 y.o. male with medical history significant of htn.esophageal stricture seen in ed for N/V abdominal pain.CT shows finding c./w enteritis. Pt meets sepsis criteria on arrival due to fever of 102.9/GI source on CT /respiratory rate of over 20/lactic acidosis of 2.4.Pt gives history and reports that his symptoms started when eh woke up this morning. Him and his wife did have take out with broiled fish with may sauce and since eating that from local restaurant he is sick wife is not ill that he knows off.No diarrhea , no rashes no travel or hematuria.   ED Course: Blood pressure (!) 114/58, pulse 68, temperature 99.7 F (37.6 C), temperature source Axillary, resp. rate 19, height 5\' 8"  (1.727 m), weight 68 kg, SpO2 95 %. Pt is alert,awake oriented and covid is pending. Labs show mild elevation in wbc and lactic acid. Pt given ivf and started on IV abx .  Review of Systems: As per HPI otherwise all systems reviewed and negative.  Past Medical History:  Diagnosis Date  . Arthritis LOW BACK AND KNEES  . Esophageal stricture   . Hypertension   . Migraine headache    Dr Domingo Cocking  . Multiple food allergies   . Nocturia   . 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 Tubular adenoma of colon 2011    Past Surgical History:  Procedure Laterality Date  . BIOPSY PROSTATE  06/14/2012   [facility]  . COLONOSCOPY  05/29/2010    "A sessile polyp was found in the ascending colon.  It was 5 mm in size.   A sessile polyp was found the the distal transverse colon.  It was 3 mm in size.   Moderate diverticulosis was found in the sigmoid colon."   . COLONOSCOPY  06/17/2020  . CYSTOSCOPY  10/26/2012   Procedure: CYSTOSCOPY  FLEXIBLE;  Surgeon: Malka So, MD;  Location: [facility];  Service: Urology;  Laterality: N/A;  . HEMORRHOID SURGERY  05-19-2006   [place]  . POLYPECTOMY    . RADIOACTIVE SEED IMPLANT  10/26/2012   Procedure: RADIOACTIVE SEED IMPLANT;  Surgeon: Malka So, MD;  [facility];  Service: Urology;  Laterality: N/A;  . TONSILLECTOMY  [id number]   74 years old  . UPPER GASTROINTESTINAL ENDOSCOPY  06/17/2020  . WRIST ARTHROSCOPY  03-27-2003   DEBRIDEMENT VOLAR ULNAR  & SCAPHOLUNATE LIGAMENT TEARS OF RIGHT WRIST     reports that he has never smoked. He has never used smokeless tobacco. He reports that he does not drink alcohol and does not use drugs.  No Known Allergies  Family History  Problem Relation Age of Onset  . Hypertension Father   . Heart failure Father   . Colon polyps Father   . Diabetes Mother   . Hypertension Mother   . Hypertension Maternal Grandmother   . Diabetes Maternal Grandmother   . Stroke Maternal Grandfather        > 55  . Bone cancer Paternal Grandfather   . Colon cancer Neg Hx   . Esophageal cancer Neg Hx   . Rectal cancer Neg Hx   . Stomach cancer Neg Hx  Prior to Admission medications   Medication Sig Start Date End Date Taking? Authorizing Provider  EPIPEN 2-PAK 0.3 MG/0.3ML SOAJ injection Inject 1 mg into the muscle as needed. 11/05/13   [provider]  fish oil-omega-3 fatty acids 1000 MG capsule Take 1 g by mouth daily. Takes 500 mg daily    [provider]  glucosamine-chondroitin 500-400 MG tablet Take 1 tablet by mouth daily.     [provider]  LOW-DOSE ASPIRIN PO 81 mg. 12/05/19   [provider]  Multiple Vitamins-Minerals (MACUVITE PO) Take by mouth.    [provider]  pantoprazole (PROTONIX) 40 MG tablet Take 1 tablet (40 mg total) by mouth daily. Patient not taking: Reported on 08/13/2020 06/17/20   Ladene Artist, MD  PRESCRIPTION MEDICATION Inject into  the muscle every 30 (thirty) days. ALLERGY INJECTION'S MONTHLY    [provider]  SUMAtriptan (IMITREX) 100 MG tablet Take 25 mg by mouth every 2 (two) hours as needed.     [provider]  vardenafil (LEVITRA) 20 MG tablet Take 1 tablet (20 mg total) by mouth daily as needed. Takes 5 mg daily (cuts tablet in 1/4) 05/27/14   Hendricks Limes, MD  vitamin C (ASCORBIC ACID) 500 MG tablet Take 500 mg by mouth daily.     [provider]    Physical Exam: Vitals:   09/27/20 1503 09/27/20 1950 09/27/20 2127  BP: (!) 132/101 (!) 93/48 (!) 114/58  Pulse: (!) 59 (!) 58 68  Resp: (!) 24 (!) 26 19  Temp: 97.9 F (36.6 C) (!) 102.9 F (39.4 C) 99.7 F (37.6 C)  TempSrc: Oral  Axillary  SpO2: 99% 96% 95%  Weight: 68 kg    Height: 5\' 8"  (1.727 m)      Constitutional: NAD, calm, comfortable Vitals:   09/27/20 1503 09/27/20 1950 09/27/20 2127  BP: (!) 132/101 (!) 93/48 (!) 114/58  Pulse: (!) 59 (!) 58 68  Resp: (!) 24 (!) 26 19  Temp: 97.9 F (36.6 C) (!) 102.9 F (39.4 C) 99.7 F (37.6 C)  TempSrc: Oral  Axillary  SpO2: 99% 96% 95%  Weight: 68 kg    Height: 5\' 8"  (1.727 m)     Eyes: PERRL, EOMI lids and conjunctivae normal ENMT: Mucous membranes are moist. Posterior pharynx clear of any exudate or lesions.Normal dentition.  Neck: normal, supple, no masses, no thyromegaly, no carotid bruit  Respiratory: clear to auscultation bilaterally, no wheezing, no crackles. Normal respiratory effort. No accessory muscle use.  Cardiovascular: Regular rate and rhythm, no murmurs / rubs / gallops. No extremity edema. 2+ pedal pulses. No carotid bruits.  Abdomen: no tenderness, no masses palpated. No hepatosplenomegaly. Bowel sounds positive.  Musculoskeletal: no clubbing / cyanosis. No joint deformity upper and lower extremities. Pt moving all four ext , no contractures. Normal muscle tone.  Skin: no rashes, lesions, ulcers. No induration Neurologic: CN 2-12 grossly intact.  Strength 5/5 in all 4.  Psychiatric: Normal judgment and insight. Alert and oriented x 3. Normal mood.   Labs on Admission: I have personally reviewed following labs and imaging studies  CBC: Recent Labs  Lab 09/27/20 1505  WBC 14.7*  HGB 15.7  HCT 44.8  MCV 83.4  PLT 621   Basic Metabolic Panel: Recent Labs  Lab 09/27/20 1505 09/27/20 2123  NA 134*  --   K 3.8  --   CL 101  --   CO2 23  --   GLUCOSE 135*  --  BUN 18  --   CREATININE 1.09  --   CALCIUM 10.6*  --   MG  --  1.5*   GFR: Estimated Creatinine Clearance: 58.9 mL/min (by C-G formula based on SCr of 1.09 mg/dL). Liver Function Tests: Recent Labs  Lab 09/27/20 1505  AST 22  ALT 16  ALKPHOS 75  BILITOT 1.7*  PROT 7.7  ALBUMIN 5.0   Recent Labs  Lab 09/27/20 1505  LIPASE 29   No results for input(s): AMMONIA in the last 168 hours. Coagulation Profile: No results for input(s): INR, PROTIME in the last 168 hours. Cardiac Enzymes: No results for input(s): CKTOTAL, CKMB, CKMBINDEX, TROPONINI in the last 168 hours. BNP (last 3 results) No results for input(s): PROBNP in the last 8760 hours. HbA1C: No results for input(s): HGBA1C in the last 72 hours. CBG: No results for input(s): GLUCAP in the last 168 hours. Lipid Profile: No results for input(s): CHOL, HDL, LDLCALC, TRIG, CHOLHDL, LDLDIRECT in the last 72 hours. Thyroid Function Tests: No results for input(s): TSH, T4TOTAL, FREET4, T3FREE, THYROIDAB in the last 72 hours. Anemia Panel: No results for input(s): VITAMINB12, FOLATE, FERRITIN, TIBC, IRON, RETICCTPCT in the last 72 hours. Urine analysis:    Component Value Date/Time   COLORURINE AMBER (A) 09/27/2020 2030   APPEARANCEUR HAZY (A) 09/27/2020 2030   LABSPEC 1.025 09/27/2020 2030   PHURINE 5.0 09/27/2020 2030   GLUCOSEU NEGATIVE 09/27/2020 2030   Susan Moore NEGATIVE 09/27/2020 2030   HGBUR negative 11/27/2010 0827   BILIRUBINUR NEGATIVE 09/27/2020 2030   KETONESUR 20 (A) 09/27/2020 2030    PROTEINUR 30 (A) 09/27/2020 2030   UROBILINOGEN 0.2 11/27/2010 0827   NITRITE NEGATIVE 09/27/2020 2030   LEUKOCYTESUR NEGATIVE 09/27/2020 2030   No intake or output data in the 24 hours ending 09/27/20 2223 Lab Results  Component Value Date   CREATININE 1.09 09/27/2020   CREATININE 0.95 12/10/2019   CREATININE 0.90 12/06/2018    COVID-19 Labs  No results for input(s): DDIMER, FERRITIN, LDH, CRP in the last 72 hours.  No results found for: [place]  Radiological Exams on Admission: CT ABDOMEN PELVIS W CONTRAST  Result Date: 09/27/2020 CLINICAL DATA:  Abdominal pain and fever. EXAM: CT ABDOMEN AND PELVIS WITH CONTRAST TECHNIQUE: Multidetector CT imaging of the abdomen and pelvis was performed using the standard protocol following bolus administration of intravenous contrast. CONTRAST:  171mL OMNIPAQUE IOHEXOL 300 MG/ML  SOLN COMPARISON:  None. FINDINGS: Lower chest: There is scarring and atelectasis at the lung bases bilaterally.The heart is enlarged. Hepatobiliary: The liver is normal. Cholelithiasis without acute inflammation.There is no biliary ductal dilation. Pancreas: Normal contours without ductal dilatation. No peripancreatic fluid collection. Spleen: Unremarkable. Adrenals/Urinary Tract: --Adrenal glands: Unremarkable. --Right kidney/ureter: No hydronephrosis or radiopaque kidney stones. --Left kidney/ureter: No hydronephrosis or radiopaque kidney stones. --Urinary bladder: Unremarkable. Stomach/Bowel: --Stomach/Duodenum: The stomach is moderately distended with an air-fluid level. 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 --Small bowel: There are few scattered mildly dilated loops of small bowel in the left mid abdomen and left upper quadrant. These loops of small bowel demonstrate mild wall thickening and adjacent mesenteric infiltration. --Colon: There are a few air-fluid levels throughout the nondilated colon consistent with liquid stool. --Appendix:  Normal. Vascular/Lymphatic: Atherosclerotic calcification is present within the non-aneurysmal abdominal aorta, without hemodynamically significant stenosis. --No retroperitoneal lymphadenopathy. --No mesenteric lymphadenopathy. --No pelvic or inguinal lymphadenopathy. Reproductive: Multiple brachytherapy beads are noted in the prostate gland. Other: There is a trace amount of free  fluid in the patient's pelvis, likely reactive. The abdominal wall is normal. Musculoskeletal. No acute displaced fractures. IMPRESSION: 1. There are few scattered mildly dilated loops of small bowel in the left mid abdomen and left upper quadrant. These loops of small bowel demonstrate mild wall thickening and adjacent mesenteric infiltration. Findings are concerning for an infectious or inflammatory enteritis. 2. There are a few air-fluid levels throughout the nondilated colon consistent with liquid stool. 3. Cholelithiasis without acute inflammation. 4. Cardiomegaly. Aortic Atherosclerosis (ICD10-I70.0). Electronically Signed   By: Constance Holster M.D.   On: 09/27/2020 21:23   DG Chest Port 1 View  Result Date: 09/27/2020 CLINICAL DATA:  Vomiting EXAM: PORTABLE CHEST 1 VIEW COMPARISON:  September 08, 2012 FINDINGS: The heart size and mediastinal contours are within normal limits. Subsegmental atelectasis seen at the left lung base. The visualized skeletal structures are unremarkable. IMPRESSION: Subsegmental atelectasis at the left lung base. Electronically Signed   By: Prudencio Pair M.D.   On: 09/27/2020 20:53    EKG: Independently reviewed.  Sinus brady at 59.  Assessment/Plan Principal Problem:   Sepsis ([place]) Active Problems:   Nausea & vomiting   History of prostate cancer   Hypertension  Sepsis: -Pt meets sepsis criteria on arrival due to fever of 102.9/GI source on CT /respiratory rate of over 20/lactic acidosis of 2.4.  Nausea/vomitng/enteritis: Suspect gastroenteritis and we will continue with ivf and  npo and iv ppi. We will give supportive care and monitor electrolytes and replace as needed. Pt advised for gi f/u upon discharge.  H/O prostates cancer:  HTN: resolved and off  Treatment for past 4 years.   GERD: Diagnosed on EGD this year and on ppi advised to refrain from NSAID;s   Tubular adenoma: Pt s/p colonoscopy this year and is followed by GI closely .  DVT prophylaxis:  Lovenox Code Status:  FULL code   Family Communication:  None at bedside   Disposition Plan:  Home   Consults called:  None.  Admission status: Observation  Status is: Observation  The patient remains OBS appropriate and will d/c before 2 midnights.  Dispo: The patient is from: Home              Anticipated d/c is to: Home              Anticipated d/c date is: 2 days              Patient currently is not medically stable to d/c.  Para Skeans MD [facility] Pager [phone] If 7PM-7AM, please contact night-coverage [email or web address] Password [place] 09/27/2020, 10:23 PM

## 2020-09-27 NOTE — ED Notes (Signed)
Pt states waking up with nausea and vomiting. Pt states that it was green in color. Pts wife brought up concerns for food poisoning on phone with RN.  Pt in bed on cardiac, bp and pulse ox monitoring.

## 2020-09-27 NOTE — ED Provider Notes (Signed)
[facility] Emergency Department Provider Note  ____________________________________________   First MD Initiated Contact with Patient 09/27/20 2032     (approximate)  I have reviewed the triage vital signs and the nursing notes.   HISTORY  Chief Complaint Emesis   HPI Brian [name] is a 74 y.o. male with past medical history of arthritis, HTN, migraines, and prostate cancer who presents for assessment of abdominal bloating associated with nausea and green emesis that occurred today.  Patient states he felt fine going to sleep last night but felt bloated this morning.  He states that over the course of today he developed worsening bloating associated with vomiting.  States he had a normal bowel movement and denies any diarrhea.  He denies any measured fevers prior to arrival, headache, earache, sore throat, cough, chest pain, back pain, burning with urination, diarrhea, blood in stool, blood in his urine, abdominal pain, rash, recent traumatic injuries, or other acute complaints.  No prior similar episodes.  No clear alleviating aggravating factors.  Patient denies tobacco abuse, EtOH use, or illicit drug use.  Denies significant NSAID use.  Patient does note he had some leftover fish yesterday that he had gotten the day before and is not sure if this could have caused his symptoms.          Past Medical History:  Diagnosis Date  . Arthritis LOW BACK AND KNEES  . Esophageal stricture   . Hypertension   . Migraine headache    Dr Domingo Cocking  . Multiple food allergies   . Nocturia   . Prostate cancer ([place]) 06/14/12   Gleason 3+3=6, volume 33.84 cc  . 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 Tubular adenoma of colon 2011    Patient Active Problem List   Diagnosis Date Noted  . Nausea & vomiting 09/27/2020  . Hypertension   . Dysphagia 12/10/2019  . Allergic rhinitis 11/23/2016  . BPV (benign positional vertigo) 11/19/2015  . Multiple food allergies    . 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 History of prostate cancer 03/20/2012  . Diverticulosis of large intestine 12/02/2008  . History of colonic polyps 12/02/2008  . Low HDL (under 40) 02/12/2008  . Headache 02/12/2008    Past Surgical History:  Procedure Laterality Date  . BIOPSY PROSTATE  06/14/2012   MD OFFICE  . COLONOSCOPY  05/29/2010    "A sessile polyp was found in the ascending colon.  It was 5 mm in size.   A sessile polyp was found the the distal transverse colon.  It was 3 mm in size.   Moderate diverticulosis was found in the sigmoid colon."   . COLONOSCOPY  06/17/2020  . CYSTOSCOPY  10/26/2012   Procedure: CYSTOSCOPY FLEXIBLE;  Surgeon: Malka So, MD;  Location: [facility];  Service: Urology;  Laterality: N/A;  . HEMORRHOID SURGERY  05-19-2006   [place]  . POLYPECTOMY    . RADIOACTIVE SEED IMPLANT  10/26/2012   Procedure: RADIOACTIVE SEED IMPLANT;  Surgeon: Malka So, MD;  Location: [facility];  Service: Urology;  Laterality: N/A;  . TONSILLECTOMY  2934   74 years old  . UPPER GASTROINTESTINAL ENDOSCOPY  06/17/2020  . WRIST ARTHROSCOPY  03-27-2003   DEBRIDEMENT VOLAR ULNAR  & SCAPHOLUNATE LIGAMENT TEARS OF RIGHT WRIST    Prior to Admission medications  Medication Sig Start Date End Date Taking? Authorizing Provider  EPIPEN 2-PAK 0.3 MG/0.3ML SOAJ injection Inject 1 mg into the muscle as needed. 11/05/13   [provider]  fish oil-omega-3 fatty acids 1000 MG capsule Take 1 g by mouth daily. Takes 500 mg daily    [provider]  glucosamine-chondroitin 500-400 MG tablet Take 1 tablet by mouth daily.     [provider]  LOW-DOSE ASPIRIN PO 81 mg. 12/05/19   [provider]  Multiple Vitamins-Minerals (MACUVITE PO) Take by mouth.    [provider]  pantoprazole (PROTONIX) 40  MG tablet Take 1 tablet (40 mg total) by mouth daily. Patient not taking: Reported on 08/13/2020 06/17/20   Ladene Artist, MD  PRESCRIPTION MEDICATION Inject into the muscle every 30 (thirty) days. ALLERGY INJECTION'S MONTHLY    [provider]  SUMAtriptan (IMITREX) 100 MG tablet Take 25 mg by mouth every 2 (two) hours as needed.     [provider]  vardenafil (LEVITRA) 20 MG tablet Take 1 tablet (20 mg total) by mouth daily as needed. Takes 5 mg daily (cuts tablet in 1/4) 05/27/14   Hendricks Limes, MD  vitamin C (ASCORBIC ACID) 500 MG tablet Take 500 mg by mouth daily.     [provider]    Allergies Patient has no known allergies.  Family History  Problem Relation Age of Onset  . Hypertension Father   . Heart failure Father   . Colon polyps Father   . Diabetes Mother   . Hypertension Mother   . Hypertension Maternal Grandmother   . Diabetes Maternal Grandmother   . Stroke Maternal Grandfather        > 55  . Bone cancer Paternal Grandfather   . Colon cancer Neg Hx   . Esophageal cancer Neg Hx   . Rectal cancer Neg Hx   . Stomach cancer Neg Hx     Social History Social History   Tobacco Use  . Smoking status: Never Smoker  . Smokeless tobacco: Never Used  Substance Use Topics  . Alcohol use: No  . Drug use: No    Review of Systems  Review of Systems  Constitutional: Negative for chills and fever.  HENT: Negative for sore throat.   Eyes: Negative for pain.  Respiratory: Negative for cough and stridor.   Cardiovascular: Negative for chest pain.  Gastrointestinal: Positive for nausea and vomiting.  Genitourinary: Negative for dysuria.  Musculoskeletal: Negative for myalgias.  Skin: Negative for rash.  Neurological: Negative for seizures, loss of consciousness and headaches.  Psychiatric/Behavioral: Negative for suicidal ideas.  All other systems reviewed and are negative.      ____________________________________________   PHYSICAL EXAM:  VITAL SIGNS: ED Triage Vitals [09/27/20 1503]  Enc Vitals Group     BP (!) 132/101     Pulse Rate (!) 59     Resp (!) 24     Temp 97.9 F (36.6 C)     Temp Source Oral     SpO2 99 %     Weight 150 lb (68 kg)     Height 5' 8"  (1.727 m)     Head Circumference      Peak Flow      Pain Score 0     Pain Loc      Pain Edu?      Excl. in [place]?    Vitals:   09/27/20 1950 09/27/20 2127  BP: (!) 93/48 (!) 114/58  Pulse: (!) 58 68  Resp: (!) 26 19  Temp: (!) 102.9 F (39.4 C) 99.7 F (37.6 C)  SpO2: 96% 95%   Physical Exam Vitals and nursing note reviewed.  Constitutional:      Appearance: He is well-developed.  HENT:     Head: Normocephalic and atraumatic.     Right Ear: External ear normal.     Left Ear: External ear normal.     Nose: Nose normal.     Mouth/Throat:     Mouth: Mucous membranes are dry.  Eyes:     Conjunctiva/sclera: Conjunctivae normal.  Cardiovascular:     Rate and Rhythm: Regular rhythm. Bradycardia present.     Heart sounds: No murmur heard.   Pulmonary:     Effort: Pulmonary effort is normal. No respiratory distress.     Breath sounds: Normal breath sounds.  Abdominal:     Palpations: Abdomen is soft.     Tenderness: There is no abdominal tenderness.  Musculoskeletal:     Cervical back: Neck supple.     Right lower leg: No edema.     Left lower leg: No edema.  Skin:    General: Skin is warm and dry.     Capillary Refill: Capillary refill takes more than 3 seconds.  Neurological:     Mental Status: He is alert and oriented to person, place, and time.  Psychiatric:        Mood and Affect: Mood normal.      ____________________________________________   LABS (all labs ordered are listed, but only abnormal results are displayed)  Labs Reviewed  COMPREHENSIVE METABOLIC PANEL - Abnormal; Notable for the following components:      Result Value   Sodium 134 (*)     Glucose, Bld 135 (*)    Calcium 10.6 (*)    Total Bilirubin 1.7 (*)    All other components within normal limits  CBC - Abnormal; Notable for the following components:   WBC 14.7 (*)    All other components within normal limits  URINALYSIS, COMPLETE (UACMP) WITH MICROSCOPIC - Abnormal; Notable for the following components:   Color, Urine AMBER (*)    APPearance HAZY (*)    Ketones, ur 20 (*)    Protein, ur 30 (*)    All other components within normal limits  LACTIC ACID, PLASMA - Abnormal; Notable for the following components:   Lactic Acid, Venous 2.4 (*)    All other components within normal limits  CULTURE, BLOOD (ROUTINE X 2)  CULTURE, BLOOD (ROUTINE X 2)  URINE CULTURE  RESPIRATORY PANEL BY RT PCR (FLU A&B, COVID)  LIPASE, BLOOD  PROCALCITONIN  LACTIC ACID, PLASMA  MAGNESIUM  TROPONIN I (HIGH SENSITIVITY)   ____________________________________________  EKG  Sinus bradycardia with ventricular rate of 59, normal axis, incomplete right bundle branch block, very poor tracing in the lateral leads without clear evidence of acute ischemia. ____________________________________________  RADIOLOGY  Official radiology report(s): CT ABDOMEN PELVIS W CONTRAST  Result Date: 09/27/2020 CLINICAL DATA:  Abdominal pain and fever. EXAM: CT ABDOMEN AND PELVIS WITH CONTRAST TECHNIQUE: Multidetector CT imaging of the abdomen and pelvis was performed using the standard protocol following bolus administration of intravenous contrast. CONTRAST:  197m OMNIPAQUE IOHEXOL 300 MG/ML  SOLN COMPARISON:  None. FINDINGS: Lower chest: There is scarring and atelectasis at the lung bases bilaterally.The heart is enlarged. Hepatobiliary: The liver is normal. Cholelithiasis without acute inflammation.There is no biliary ductal dilation. Pancreas: Normal contours without ductal dilatation. No peripancreatic  fluid collection. Spleen: Unremarkable. Adrenals/Urinary Tract: --Adrenal glands: Unremarkable. --Right  kidney/ureter: No hydronephrosis or radiopaque kidney stones. --Left kidney/ureter: No hydronephrosis or radiopaque kidney stones. --Urinary bladder: Unremarkable. Stomach/Bowel: --Stomach/Duodenum: The stomach is moderately distended with an air-fluid level. 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 --Small bowel: There are few scattered mildly dilated loops of small bowel in the left mid abdomen and left upper quadrant. These loops of small bowel demonstrate mild wall thickening and adjacent mesenteric infiltration. --Colon: There are a few air-fluid levels throughout the nondilated colon consistent with liquid stool. --Appendix: Normal. Vascular/Lymphatic: Atherosclerotic calcification is present within the non-aneurysmal abdominal aorta, without hemodynamically significant stenosis. --No retroperitoneal lymphadenopathy. --No mesenteric lymphadenopathy. --No pelvic or inguinal lymphadenopathy. Reproductive: Multiple brachytherapy beads are noted in the prostate gland. Other: There is a trace amount of free fluid in the patient's pelvis, likely reactive. The abdominal wall is normal. Musculoskeletal. No acute displaced fractures. IMPRESSION: 1. There are few scattered mildly dilated loops of small bowel in the left mid abdomen and left upper quadrant. These loops of small bowel demonstrate mild wall thickening and adjacent mesenteric infiltration. Findings are concerning for an infectious or inflammatory enteritis. 2. There are a few air-fluid levels throughout the nondilated colon consistent with liquid stool. 3. Cholelithiasis without acute inflammation. 4. Cardiomegaly. Aortic Atherosclerosis (ICD10-I70.0). Electronically Signed   By: Constance Holster M.D.   On: 09/27/2020 21:23   DG Chest Port 1 View  Result Date: 09/27/2020 CLINICAL DATA:  Vomiting EXAM: PORTABLE CHEST 1 VIEW COMPARISON:  September 08, 2012 FINDINGS: The heart size and mediastinal contours are within normal  limits. Subsegmental atelectasis seen at the left lung base. The visualized skeletal structures are unremarkable. IMPRESSION: Subsegmental atelectasis at the left lung base. Electronically Signed   By: Prudencio Pair M.D.   On: 09/27/2020 20:53    ____________________________________________   PROCEDURES  Procedure(s) performed (including Critical Care):  .1-3 Lead EKG Interpretation Performed by: Lucrezia Starch, MD Authorized by: Lucrezia Starch, MD     Interpretation: normal     ECG rate assessment: normal     Rhythm: sinus rhythm     Ectopy: none       ____________________________________________   INITIAL IMPRESSION / ASSESSMENT AND PLAN / ED COURSE        Patient presents above to history exam for assessment of abdominal bloating associated with vomiting and nausea.  Patient is bradycardic, tachypneic, with otherwise stable vital signs arrival.  Exam as above remarkable for dry mucous membranes, delayed cap refill, and abdomen is relatively soft throughout.   Differential includes ACS, acute cholecystitis, pancreatitis, gastritis, appendicitis, diverticulitis, pyelonephritis, and metabolic derangements.  Low suspicion for toxic ingestion.  No CVA tenderness or findings on UA to suggest urinary source for patient's symptoms.  Patient denies any chest pain normal ECG does have some nonspecific findings I low suspicion for ACS at this time.  However given patient's age we will plan to obtain troponin.  CT abdomen pelvis obtained does show evidence of gastroenteritis without evidence of acute cholecystitis, pancreatitis, appendicitis, pyelonephritis, or other acute intrathoracic process.  Patient has some gallstones but his gallbladder is is unremarkable.  He is noted to have some atherosclerosis and no other acute intra-abdominal process.  CMP remarkable for T bili of 1.7 with no other significant metabolic or electrolyte derangements.  No epic notes of transaminitis.  CBC shows  WBC count of 14.7 otherwise unremarkable.  Lipase is WNL and not consistent with  acute pancreatitis.  Initial lactic acid is 2.4.  While in the emergency room patient did have an elevated temperature with a fever at 102.1 and some soft blood pressures.  Given elevated white blood cell count and tachypnea code sepsis was initiated and patient was given 30 cc/kg of IV fluids cultures were obtained and he was given 1 dose of broad-spectrum antibiotic as noted below.  Impression is likely severe dehydration and multiple SIRS criteria met likely from infectious gastroenteritis although patient denies any diarrhea at this time.  Unclear if this is related to recent seafood ingestion reported in HPI.  We will plan to admit to hospital service for observation.   ____________________________________________   FINAL CLINICAL IMPRESSION(S) / ED DIAGNOSES  Final diagnoses:  Gastroenteritis  Lactic acid acidosis  Leukocytosis, unspecified type  Dehydration  Fever, unspecified fever cause    Medications  sodium chloride 0.9 % bolus 1,000 mL (1,000 mLs Intravenous New Bag/Given 09/27/20 2124)    And  sodium chloride 0.9 % bolus 1,000 mL (1,000 mLs Intravenous New Bag/Given 09/27/20 2047)    And  sodium chloride 0.9 % bolus 250 mL (250 mLs Intravenous New Bag/Given 09/27/20 2123)  piperacillin-tazobactam (ZOSYN) IVPB 3.375 g (3.375 g Intravenous New Bag/Given 09/27/20 2124)  lactated ringers infusion ( Intravenous New Bag/Given 09/27/20 2126)  ondansetron (ZOFRAN) injection 4 mg (4 mg Intravenous Given 09/27/20 1515)  acetaminophen (TYLENOL) tablet 1,000 mg (1,000 mg Oral Given 09/27/20 2007)  iohexol (OMNIPAQUE) 300 MG/ML solution 100 mL (100 mLs Intravenous Contrast Given 09/27/20 2102)     ED Discharge Orders    None       Note:  This document was prepared using Dragon voice recognition software and may include unintentional dictation errors.   Lucrezia Starch, MD 09/27/20 2154

## 2020-09-28 DIAGNOSIS — R112 Nausea with vomiting, unspecified: Secondary | ICD-10-CM | POA: Diagnosis not present

## 2020-09-28 NOTE — ED Notes (Signed)
Patient is resting comfortably. Breathing is even and unlabored. Will continue to monitor.

## 2020-09-28 NOTE — Progress Notes (Signed)
Pt tolerated clear liquids okay. No nausea, no vomiting. VSS. Pt wants to know when he is going home. Txt page Dr. Billie Ruddy. Will continue to monitor.

## 2020-09-28 NOTE — Discharge Summary (Signed)
Physician Discharge Summary   EIVAN GALLINA  male DOB: 1946-01-23  DXI:[id number]  PCP: Binnie Rail, MD  Admit date: 09/27/2020 Discharge date: 09/28/2020  Admitted From: home Disposition:  home CODE STATUS: Full code  Discharge Instructions    Discharge instructions   Complete by: As directed    As we discussed, you likely had food poisoning which has resolved.  Since you are feeling well and tolerating eating, you are good to go home.     Dr. Enzo Bi [facility] Course:  For full details, please see H&P, progress notes, consult notes and ancillary notes.  Briefly,  Brian [name] is a 74 y.o. male with medical history significant of htn.esophageal stricture seen in ed for N/V abdominal pain.  Sepsis ruled out Pt had SIRS criteria, however, no source of infection.  Nausea/vomitng/enteritis 2/2 food poisoning CT showed finding c./w enteritis.  Pt admitted to eating take-out fish that was several days old.  Pt's symptoms were most likely due to food poisoning which resolved the day after presentation.  Pt tolerated regular diet and felt ready to go home on the day of discharge.  H/O prostates cancer:  Hx of HTN, not currently active Not on BP meds at home.  GERD: Diagnosed on EGD this year and on ppi   Tubular adenoma: Pt s/p colonoscopy this year and is followed by GI closely .   Discharge Diagnoses:  Principal Problem:   Sepsis ([place]) Active Problems:   History of prostate cancer   Hypertension   Nausea & vomiting    Discharge Instructions:  Allergies as of 09/28/2020   No Known Allergies     Medication List    TAKE these medications   EpiPen 2-Pak 0.3 mg/0.3 mL Soaj injection Generic drug: EPINEPHrine Inject 1 mg into the muscle as needed.   fish oil-omega-3 fatty acids 1000 MG capsule Take 1 g by mouth daily. Takes 500 mg daily   glucosamine-chondroitin 500-400 MG tablet Take 1 tablet by mouth daily.   LOW-DOSE  ASPIRIN PO 81 mg.   MACUVITE PO Take by mouth.   pantoprazole 40 MG tablet Commonly known as: PROTONIX Take 1 tablet (40 mg total) by mouth daily.   PRESCRIPTION MEDICATION Inject into the muscle every 30 (thirty) days. ALLERGY INJECTION'S MONTHLY   SUMAtriptan 100 MG tablet Commonly known as: IMITREX Take 25 mg by mouth every 2 (two) hours as needed.   vardenafil 20 MG tablet Commonly known as: LEVITRA Take 1 tablet (20 mg total) by mouth daily as needed. Takes 5 mg daily (cuts tablet in 1/4)   vitamin C 500 MG tablet Commonly known as: ASCORBIC ACID Take 500 mg by mouth daily.        Follow-up Information    Binnie Rail, MD. Schedule an appointment as soon as possible for a visit in 1 week(s).   Specialty: Internal Medicine Contact information: [place] [address] [phone]               No Known Allergies   The results of significant diagnostics from this hospitalization (including imaging, microbiology, ancillary and laboratory) are listed below for reference.   Consultations:   Procedures/Studies: CT ABDOMEN PELVIS W CONTRAST  Result Date: 09/27/2020 CLINICAL DATA:  Abdominal pain and fever. EXAM: CT ABDOMEN AND PELVIS WITH CONTRAST TECHNIQUE: Multidetector CT imaging of the abdomen and pelvis was performed using the standard protocol following bolus administration of  intravenous contrast. CONTRAST:  159mL OMNIPAQUE IOHEXOL 300 MG/ML  SOLN COMPARISON:  None. FINDINGS: Lower chest: There is scarring and atelectasis at the lung bases bilaterally.The heart is enlarged. Hepatobiliary: The liver is normal. Cholelithiasis without acute inflammation.There is no biliary ductal dilation. Pancreas: Normal contours without ductal dilatation. No peripancreatic fluid collection. Spleen: Unremarkable. Adrenals/Urinary Tract: --Adrenal glands: Unremarkable. --Right kidney/ureter: No hydronephrosis or radiopaque kidney stones. --Left  kidney/ureter: No hydronephrosis or radiopaque kidney stones. --Urinary bladder: Unremarkable. Stomach/Bowel: --Stomach/Duodenum: The stomach is moderately distended with an air-fluid level. 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 --Small bowel: There are few scattered mildly dilated loops of small bowel in the left mid abdomen and left upper quadrant. These loops of small bowel demonstrate mild wall thickening and adjacent mesenteric infiltration. --Colon: There are a few air-fluid levels throughout the nondilated colon consistent with liquid stool. --Appendix: Normal. Vascular/Lymphatic: Atherosclerotic calcification is present within the non-aneurysmal abdominal aorta, without hemodynamically significant stenosis. --No retroperitoneal lymphadenopathy. --No mesenteric lymphadenopathy. --No pelvic or inguinal lymphadenopathy. Reproductive: Multiple brachytherapy beads are noted in the prostate gland. Other: There is a trace amount of free fluid in the patient's pelvis, likely reactive. The abdominal wall is normal. Musculoskeletal. No acute displaced fractures. IMPRESSION: 1. There are few scattered mildly dilated loops of small bowel in the left mid abdomen and left upper quadrant. These loops of small bowel demonstrate mild wall thickening and adjacent mesenteric infiltration. Findings are concerning for an infectious or inflammatory enteritis. 2. There are a few air-fluid levels throughout the nondilated colon consistent with liquid stool. 3. Cholelithiasis without acute inflammation. 4. Cardiomegaly. Aortic Atherosclerosis (ICD10-I70.0). Electronically Signed   By: Constance Holster M.D.   On: 09/27/2020 21:23   DG Chest Port 1 View  Result Date: 09/27/2020 CLINICAL DATA:  Vomiting EXAM: PORTABLE CHEST 1 VIEW COMPARISON:  September 08, 2012 FINDINGS: The heart size and mediastinal contours are within normal limits. Subsegmental atelectasis seen at the left lung base. The visualized  skeletal structures are unremarkable. IMPRESSION: Subsegmental atelectasis at the left lung base. Electronically Signed   By: Prudencio Pair M.D.   On: 09/27/2020 20:53      Labs: BNP (last 3 results) No results for input(s): BNP in the last 8760 hours. Basic Metabolic Panel: Recent Labs  Lab 09/27/20 1505 09/27/20 2123  NA 134*  --   K 3.8  --   CL 101  --   CO2 23  --   GLUCOSE 135*  --   BUN 18  --   CREATININE 1.09  --   CALCIUM 10.6*  --   MG  --  1.5*   Liver Function Tests: Recent Labs  Lab 09/27/20 1505  AST 22  ALT 16  ALKPHOS 75  BILITOT 1.7*  PROT 7.7  ALBUMIN 5.0   Recent Labs  Lab 09/27/20 1505  LIPASE 29   No results for input(s): AMMONIA in the last 168 hours. CBC: Recent Labs  Lab 09/27/20 1505  WBC 14.7*  HGB 15.7  HCT 44.8  MCV 83.4  PLT 224   Cardiac Enzymes: No results for input(s): CKTOTAL, CKMB, CKMBINDEX, TROPONINI in the last 168 hours. BNP: Invalid input(s): POCBNP CBG: No results for input(s): GLUCAP in the last 168 hours. D-Dimer No results for input(s): DDIMER in the last 72 hours. Hgb A1c No results for input(s): HGBA1C in the last 72 hours. Lipid Profile No results for input(s): CHOL, HDL, LDLCALC, TRIG, CHOLHDL, LDLDIRECT in the last 72 hours.  Thyroid function studies No results for input(s): TSH, T4TOTAL, T3FREE, THYROIDAB in the last 72 hours.  Invalid input(s): FREET3 Anemia work up No results for input(s): VITAMINB12, FOLATE, FERRITIN, TIBC, IRON, RETICCTPCT in the last 72 hours. Urinalysis    Component Value Date/Time   COLORURINE AMBER (A) 09/27/2020 2030   APPEARANCEUR HAZY (A) 09/27/2020 2030   LABSPEC 1.025 09/27/2020 2030   PHURINE 5.0 09/27/2020 2030   GLUCOSEU NEGATIVE 09/27/2020 2030   [place] NEGATIVE 09/27/2020 2030   HGBUR negative 11/27/2010 0827   BILIRUBINUR NEGATIVE 09/27/2020 2030   KETONESUR 20 (A) 09/27/2020 2030   PROTEINUR 30 (A) 09/27/2020 2030   UROBILINOGEN 0.2 11/27/2010 0827    NITRITE NEGATIVE 09/27/2020 2030   LEUKOCYTESUR NEGATIVE 09/27/2020 2030   Sepsis Labs Invalid input(s): PROCALCITONIN,  WBC,  LACTICIDVEN Microbiology Recent Results (from the past 240 hour(s))  Blood Culture (routine x 2)     Status: None (Preliminary result)   Collection Time: 09/27/20  8:38 PM   Specimen: BLOOD  Result Value Ref Range Status   Specimen Description BLOOD RIGHT ANTECUBITAL  Final   Special Requests   Final    BOTTLES DRAWN AEROBIC AND ANAEROBIC Blood Culture results may not be optimal due to an excessive volume of blood received in culture bottles   Culture   Final    NO GROWTH < 12 HOURS Performed at [facility], [address], [place], [place] [address]    Report Status PENDING  Incomplete  Blood Culture (routine x 2)     Status: None (Preliminary result)   Collection Time: 09/27/20  8:39 PM   Specimen: BLOOD  Result Value Ref Range Status   Specimen Description BLOOD LEFT ANTECUBITAL  Final   Special Requests   Final    BOTTLES DRAWN AEROBIC AND ANAEROBIC Blood Culture results may not be optimal due to an excessive volume of blood received in culture bottles   Culture   Final    NO GROWTH < 12 HOURS Performed at [facility], [address] [place], [place] [address]    Report Status PENDING  Incomplete  Respiratory Panel by RT PCR (Flu A&B, Covid) - Nasopharyngeal Swab     Status: None   Collection Time: 09/27/20  9:23 PM   Specimen: Nasopharyngeal Swab  Result Value Ref Range Status   SARS Coronavirus 2 by RT PCR NEGATIVE NEGATIVE Final    Comment: (NOTE) SARS-CoV-2 target nucleic acids are NOT DETECTED.  The SARS-CoV-2 RNA is generally detectable in upper respiratoy specimens during the acute phase of infection. The lowest concentration of SARS-CoV-2 viral copies this assay can detect is 131 copies/mL. A negative result does not preclude SARS-Cov-2 infection and should not be used as the sole basis for treatment or other  patient management decisions. A negative result may occur with  improper specimen collection/handling, submission of specimen other than nasopharyngeal swab, presence of viral mutation(s) within the areas targeted by this assay, and inadequate number of viral copies (<131 copies/mL). A negative result must be combined with clinical observations, patient history, and epidemiological information. The expected result is Negative.  Fact Sheet for Patients:  PinkCheek.be  Fact Sheet for Healthcare Providers:  GravelBags.it  This test is no t yet approved or cleared by the [place] FDA and  has been authorized for detection and/or diagnosis of SARS-CoV-2 by FDA under an Emergency Use Authorization (EUA). This EUA will remain  in effect (meaning this test can be used) for the duration of  the COVID-19 declaration under Section 564(b)(1) of the Act, 21 U.S.C. section 360bbb-3(b)(1), unless the authorization is terminated or revoked sooner.     Influenza A by PCR NEGATIVE NEGATIVE Final   Influenza B by PCR NEGATIVE NEGATIVE Final    Comment: (NOTE) The Xpert Xpress SARS-CoV-2/FLU/RSV assay is intended as an aid in  the diagnosis of influenza from Nasopharyngeal swab specimens and  should not be used as a sole basis for treatment. Nasal washings and  aspirates are unacceptable for Xpert Xpress SARS-CoV-2/FLU/RSV  testing.  Fact Sheet for Patients: PinkCheek.be  Fact Sheet for Healthcare Providers: GravelBags.it  This test is not yet approved or cleared by the [place] FDA and  has been authorized for detection and/or diagnosis of SARS-CoV-2 by  FDA under an Emergency Use Authorization (EUA). This EUA will remain  in effect (meaning this test can be used) for the duration of the  Covid-19 declaration under Section 564(b)(1) of the Act, 21  U.S.C. section  360bbb-3(b)(1), unless the authorization is  terminated or revoked. Performed at [facility], [address] [place], [place] [address]      Total time spend on discharging this patient, including the last patient exam, discussing the hospital stay, instructions for ongoing care as it relates to all pertinent caregivers, as well as preparing the medical discharge records, prescriptions, and/or referrals as applicable, is 35 minutes.    Enzo Bi, MD  [facility] 09/28/2020, 10:19 AM  If 7PM-7AM, please contact night-coverage

## 2020-09-28 NOTE — Discharge Instructions (Signed)
Dehydration, Adult Dehydration is a condition in which there is not enough water or other fluids in the body. This happens when a person loses more fluids than he or she takes in. Important organs, such as the kidneys, brain, and heart, cannot function without a proper amount of fluids. Any loss of fluids from the body can lead to dehydration. Dehydration can be mild, moderate, or severe. It should be treated right away to prevent it from becoming severe. What are the causes? Dehydration may be caused by:  Conditions that cause loss of water or other fluids, such as diarrhea, vomiting, or sweating or urinating a lot.  Not drinking enough fluids, especially when you are ill or doing activities that require a lot of energy.  Other illnesses and conditions, such as fever or infection.  Certain medicines, such as medicines that remove excess fluid from the body (diuretics).  Lack of safe drinking water.  Not being able to get enough water and food. What increases the risk? The following factors may make you more likely to develop this condition:  Having a long-term (chronic) illness that has not been treated properly, such as diabetes, heart disease, or kidney disease.  Being 65 years of age or older.  Having a disability.  Living in a place that is high in altitude, where thinner, drier air causes more fluid loss.  Doing exercises that put stress on your body for a long time (endurance sports). What are the signs or symptoms? Symptoms of dehydration depend on how severe it is. Mild or moderate dehydration  Thirst.  Dry lips or dry mouth.  Dizziness or light-headedness, especially when standing up from a seated position.  Muscle cramps.  Dark urine. Urine may be the color of tea.  Less urine or tears produced than usual.  Headache. Severe dehydration  Changes in skin. Your skin may be cold and clammy, blotchy, or pale. Your skin also may not return to normal after being  lightly pinched and released.  Little or no tears, urine, or sweat.  Changes in vital signs, such as rapid breathing and low blood pressure. Your pulse may be weak or may be faster than 100 beats a minute when you are sitting still.  Other changes, such as: ? Feeling very thirsty. ? Sunken eyes. ? Cold hands and feet. ? Confusion. ? Being very tired (lethargic) or having trouble waking from sleep. ? Short-term weight loss. ? Loss of consciousness. How is this diagnosed? This condition is diagnosed based on your symptoms and a physical exam. You may have blood and urine tests to help confirm the diagnosis. How is this treated? Treatment for this condition depends on how severe it is. Treatment should be started right away. Do not wait until dehydration becomes severe. Severe dehydration is an emergency and needs to be treated in a hospital.  Mild or moderate dehydration can be treated at home. You may be asked to: ? Drink more fluids. ? Drink an oral rehydration solution (ORS). This drink helps restore proper amounts of fluids and salts and minerals in the blood (electrolytes).  Severe dehydration can be treated: ? With IV fluids. ? By correcting abnormal levels of electrolytes. This is often done by giving electrolytes through a tube that is passed through your nose and into your stomach (nasogastric tube, or NG tube). ? By treating the underlying cause of dehydration. Follow these instructions at home: Oral rehydration solution If told by your health care provider, drink an ORS:  Make   an ORS by following instructions on the package.  Start by drinking small amounts, about  cup (120 mL) every 5-10 minutes.  Slowly increase how much you drink until you have taken the amount recommended by your health care provider. Eating and drinking         Drink enough clear fluid to keep your urine pale yellow. If you were told to drink an ORS, finish the ORS first and then start slowly  drinking other clear fluids. Drink fluids such as: ? Water. Do not drink only water. Doing that can lead to hyponatremia, which is having too little salt (sodium) in the body. ? Water from ice chips you suck on. ? Fruit juice that you have added water to (diluted fruit juice). ? Low-calorie sports drinks.  Eat foods that contain a healthy balance of electrolytes, such as bananas, oranges, potatoes, tomatoes, and spinach.  Do not drink alcohol.  Avoid the following: ? Drinks that contain a lot of sugar. These include high-calorie sports drinks, fruit juice that is not diluted, and soda. ? Caffeine. ? Foods that are greasy or contain a lot of fat or sugar. General instructions  Take over-the-counter and prescription medicines only as told by your health care provider.  Do not take sodium tablets. Doing that can lead to having too much sodium in the body (hypernatremia).  Return to your normal activities as told by your health care provider. Ask your health care provider what activities are safe for you.  Keep all follow-up visits as told by your health care provider. This is important. Contact a health care provider if:  You have muscle cramps, pain, or discomfort, such as: ? Pain in your abdomen and the pain gets worse or stays in one area (localizes). ? Stiff neck.  You have a rash.  You are more irritable than usual.  You are sleepier or have a harder time waking than usual.  You feel weak or dizzy.  You feel very thirsty. Get help right away if you have:  Any symptoms of severe dehydration.  Symptoms of vomiting, such as: ? You cannot eat or drink without vomiting. ? Vomiting gets worse or does not go away. ? Vomit includes blood or green matter (bile).  Symptoms that get worse with treatment.  A fever.  A severe headache.  Problems with urination or bowel movements, such as: ? Diarrhea that gets worse or does not go away. ? Blood in your stool (feces). This  may cause stool to look black and tarry. ? Not urinating, or urinating only a small amount of very dark urine, within 6-8 hours.  Trouble breathing. These symptoms may represent a serious problem that is an emergency. Do not wait to see if the symptoms will go away. Get medical help right away. Call your local emergency services (911 in the [place] Do not drive yourself to the hospital. Summary  Dehydration is a condition in which there is not enough water or other fluids in the body. This happens when a person loses more fluids than he or she takes in.  Treatment for this condition depends on how severe it is. Treatment should be started right away. Do not wait until dehydration becomes severe.  Drink enough clear fluid to keep your urine pale yellow. If you were told to drink an oral rehydration solution (ORS), finish the ORS first and then start slowly drinking other clear fluids.  Take over-the-counter and prescription medicines only as told by your health care   provider.  Get help right away if you have any symptoms of severe dehydration. This information is not intended to replace advice given to you by your health care provider. Make sure you discuss any questions you have with your health care provider. Document Revised: 07/26/2019 Document Reviewed: 07/26/2019 Elsevier Patient Education  2020 Elsevier Inc.   

## 2020-09-29 ENCOUNTER — Encounter: Payer: Self-pay | Admitting: Internal Medicine

## 2020-09-29 DIAGNOSIS — I700 Atherosclerosis of aorta: Secondary | ICD-10-CM | POA: Insufficient documentation

## 2020-09-29 LAB — URINE CULTURE

## 2020-10-02 DIAGNOSIS — R519 Headache, unspecified: Secondary | ICD-10-CM | POA: Diagnosis not present

## 2020-10-02 DIAGNOSIS — J3089 Other allergic rhinitis: Secondary | ICD-10-CM | POA: Diagnosis not present

## 2020-10-02 DIAGNOSIS — J301 Allergic rhinitis due to pollen: Secondary | ICD-10-CM | POA: Diagnosis not present

## 2020-10-02 LAB — CULTURE, BLOOD (ROUTINE X 2)
Culture: NO GROWTH
Culture: NO GROWTH

## 2020-10-07 DIAGNOSIS — G43719 Chronic migraine without aura, intractable, without status migrainosus: Secondary | ICD-10-CM | POA: Diagnosis not present

## 2020-10-07 DIAGNOSIS — G43019 Migraine without aura, intractable, without status migrainosus: Secondary | ICD-10-CM | POA: Diagnosis not present

## 2020-10-09 IMAGING — CT CT ABD-PELV W/ CM
2 of 5 series · 15 of 46 positions shown, 17 images · IV contrast (APPLIED)
Comparison: None.

CLINICAL DATA: Abdominal pain and fever.

EXAM:
CT ABDOMEN AND PELVIS WITH CONTRAST
TECHNIQUE: Multidetector CT imaging of the abdomen and pelvis was performed
using the standard protocol following bolus administration of
intravenous contrast.
CONTRAST:  100mL OMNIPAQUE IOHEXOL 300 MG/ML  SOLN

[Series 2: routine abd/pel with · axial · 0.77mm/px · z∈[-1129,-704]mm · 12 of 95 slices shown, 14 images]
[im 5/95  soft-tissue]
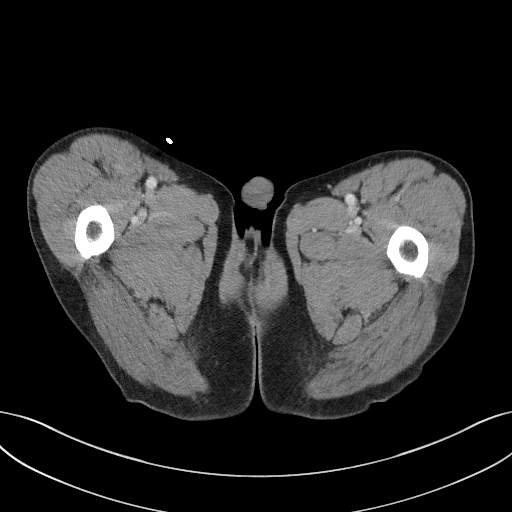
[im 5/95  bone]
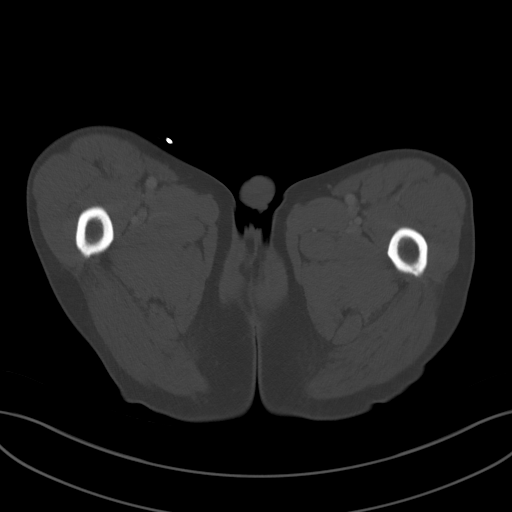
[im 15/95  soft-tissue]
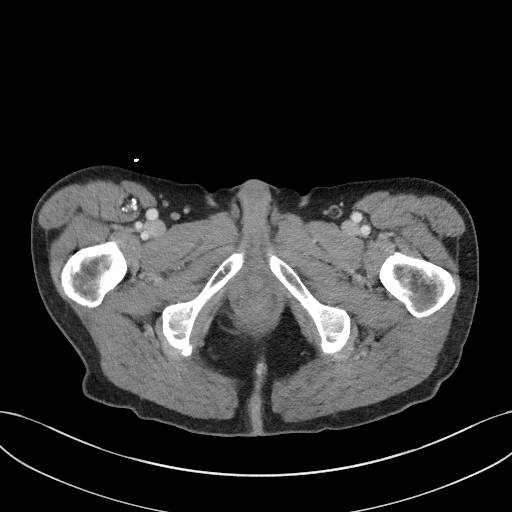
[im 20/95  soft-tissue]
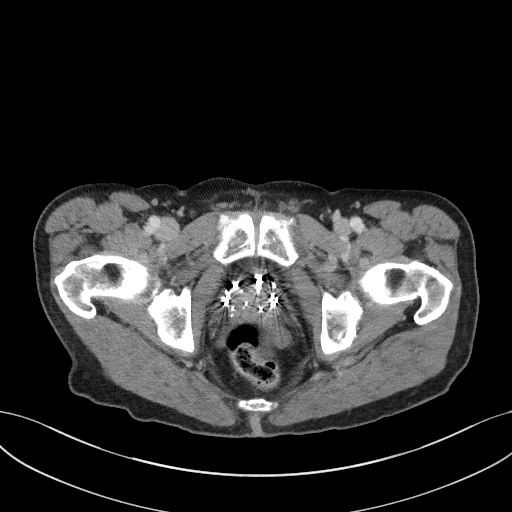
[im 30/95  soft-tissue]
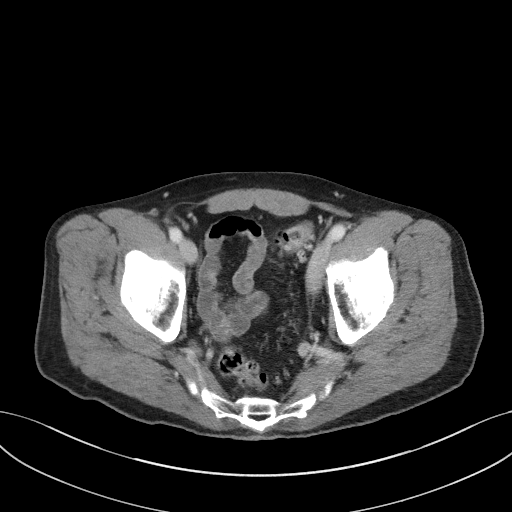
[im 35/95  soft-tissue]
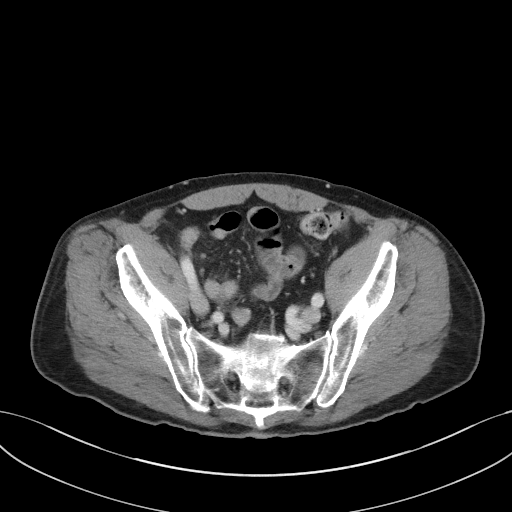
[im 45/95  soft-tissue]
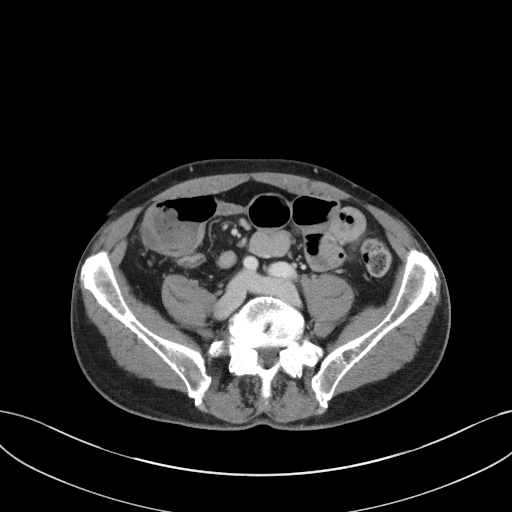
[im 50/95  soft-tissue]
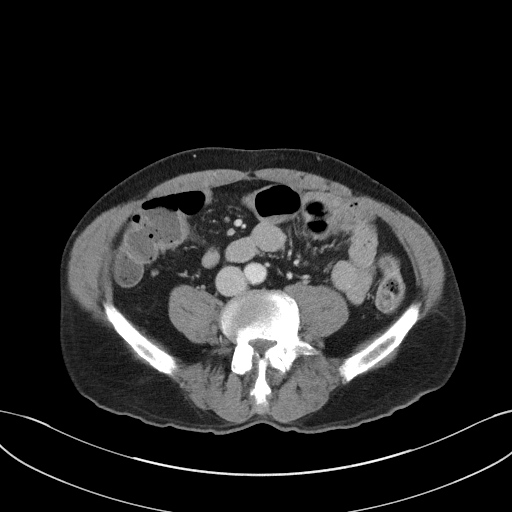
[im 60/95  soft-tissue]
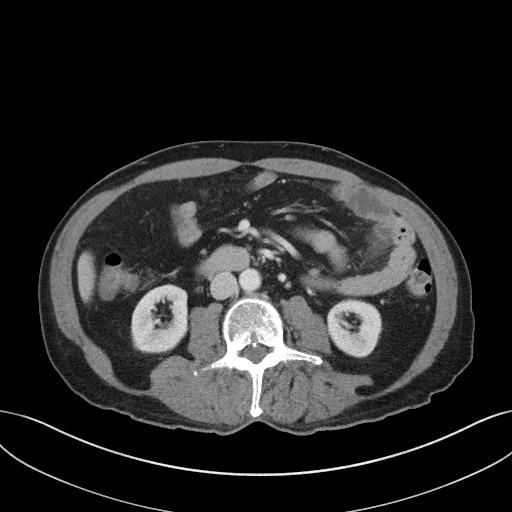
[im 65/95  soft-tissue]
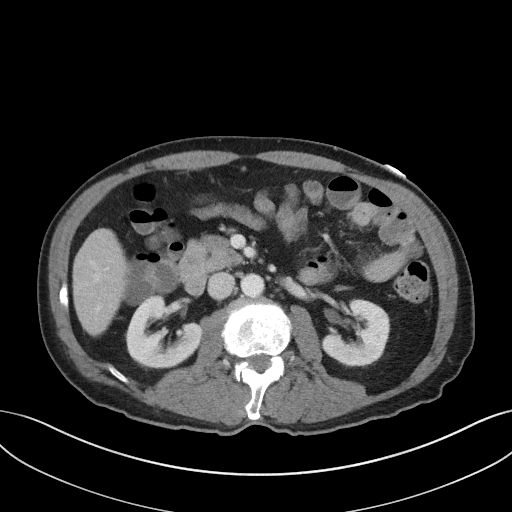
[im 65/95  bone]
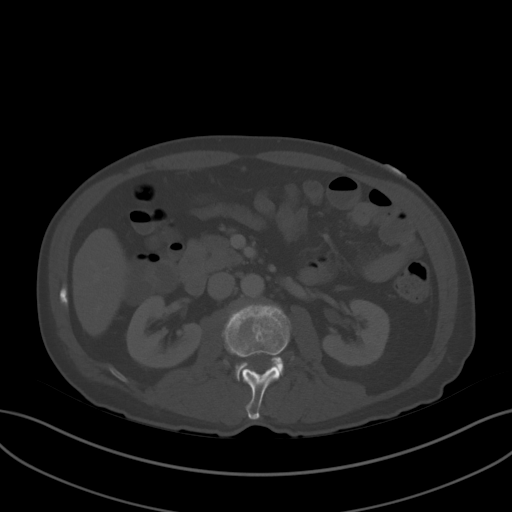
[im 75/95  soft-tissue]
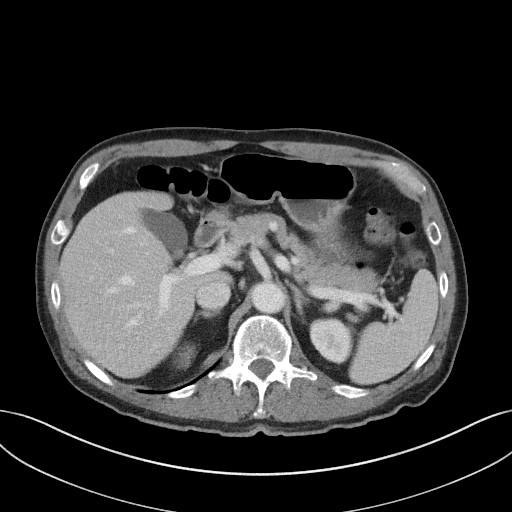
[im 80/95  soft-tissue]
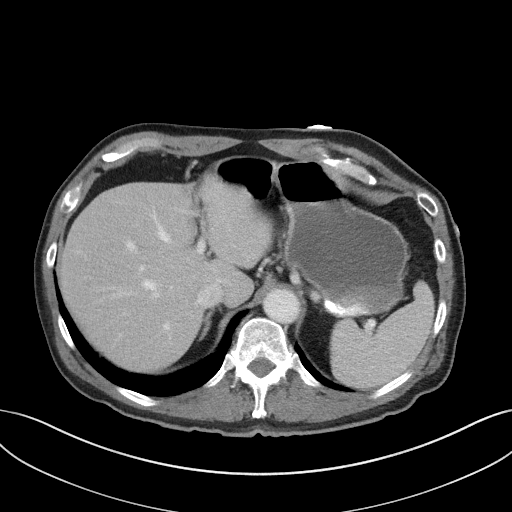
[im 90/95  soft-tissue]
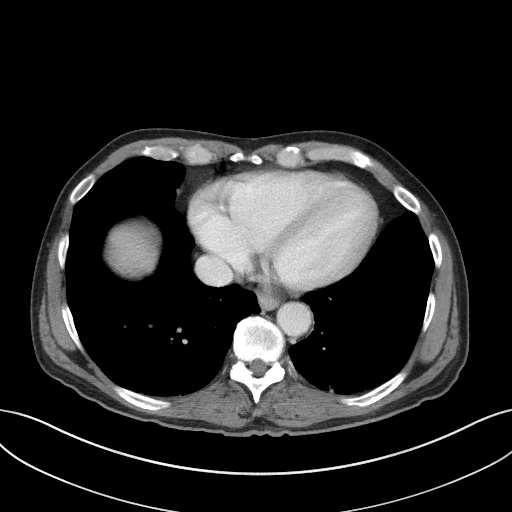

[Series 5: coronal st · coronal · 0.71mm/px · 3 of 85 slices shown]
[im 29/85  soft-tissue]
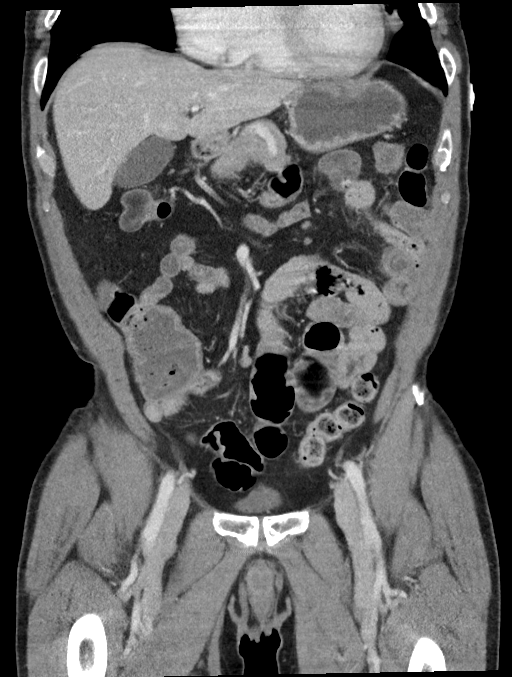
[im 38/85  soft-tissue]
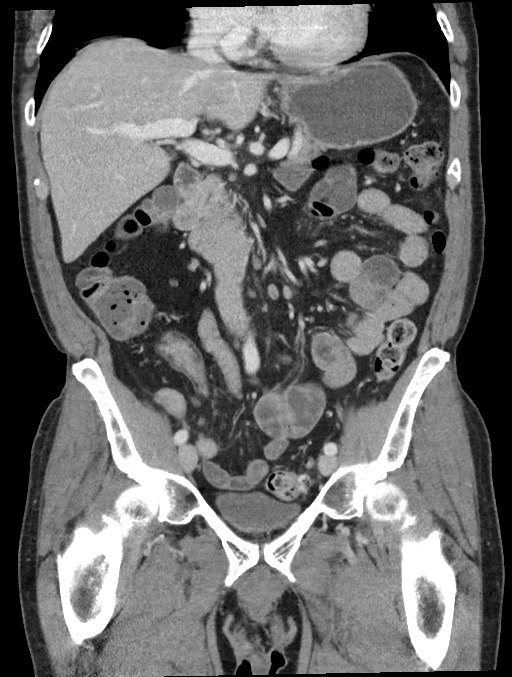
[im 47/85  soft-tissue]
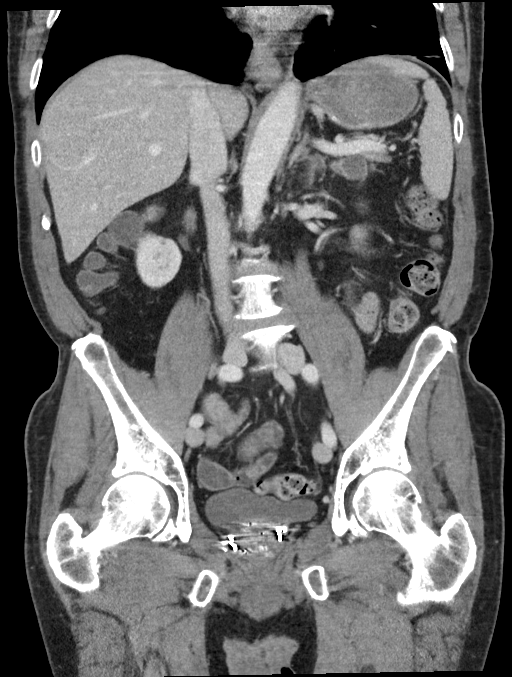

[15 of 46 positions shown; findings below may reference images not displayed]

FINDINGS: Lower chest: There is scarring and atelectasis at the lung bases
bilaterally.The heart is enlarged.

Hepatobiliary: The liver is normal. Cholelithiasis without acute
inflammation.There is no biliary ductal dilation.

Pancreas: Normal contours without ductal dilatation. No
peripancreatic fluid collection.

Spleen: Unremarkable.

Adrenals/Urinary Tract:

--Adrenal glands: Unremarkable.

--Right kidney/ureter: No hydronephrosis or radiopaque kidney
stones.

--Left kidney/ureter: No hydronephrosis or radiopaque kidney stones.

--Urinary bladder: Unremarkable.

Stomach/Bowel:

--Stomach/Duodenum: The stomach is moderately distended with an
air-fluid level. There appears to be an at least partial small bowel
malrotation.

--Small bowel: There are few scattered mildly dilated loops of small
bowel in the left mid abdomen and left upper quadrant. These loops
of small bowel demonstrate mild wall thickening and adjacent
mesenteric infiltration.

--Colon: There are a few air-fluid levels throughout the nondilated
colon consistent with liquid stool.

--Appendix: Normal.

Vascular/Lymphatic: Atherosclerotic calcification is present within
the non-aneurysmal abdominal aorta, without hemodynamically
significant stenosis.

--No retroperitoneal lymphadenopathy.

--No mesenteric lymphadenopathy.

--No pelvic or inguinal lymphadenopathy.

Reproductive: Multiple brachytherapy beads are noted in the prostate
gland.

Other: There is a trace amount of free fluid in the patient's
pelvis, likely reactive. The abdominal wall is normal.

Musculoskeletal. No acute displaced fractures.
IMPRESSION: 1. There are few scattered mildly dilated loops of small bowel in
the left mid abdomen and left upper quadrant. These loops of small
bowel demonstrate mild wall thickening and adjacent mesenteric
infiltration. Findings are concerning for an infectious or
inflammatory enteritis.
2. There are a few air-fluid levels throughout the nondilated colon
consistent with liquid stool.
3. Cholelithiasis without acute inflammation.
4. Cardiomegaly.

Aortic Atherosclerosis ([id number].D).

## 2020-10-10 DIAGNOSIS — Z5189 Encounter for other specified aftercare: Secondary | ICD-10-CM | POA: Diagnosis not present

## 2020-10-14 DIAGNOSIS — H25013 Cortical age-related cataract, bilateral: Secondary | ICD-10-CM | POA: Diagnosis not present

## 2020-10-14 DIAGNOSIS — H25043 Posterior subcapsular polar age-related cataract, bilateral: Secondary | ICD-10-CM | POA: Diagnosis not present

## 2020-10-14 DIAGNOSIS — H2511 Age-related nuclear cataract, right eye: Secondary | ICD-10-CM | POA: Diagnosis not present

## 2020-10-14 DIAGNOSIS — H2513 Age-related nuclear cataract, bilateral: Secondary | ICD-10-CM | POA: Diagnosis not present

## 2020-10-14 DIAGNOSIS — H18413 Arcus senilis, bilateral: Secondary | ICD-10-CM | POA: Diagnosis not present

## 2020-11-07 DIAGNOSIS — Z5189 Encounter for other specified aftercare: Secondary | ICD-10-CM | POA: Diagnosis not present

## 2020-11-18 ENCOUNTER — Encounter: Payer: Self-pay | Admitting: Gastroenterology

## 2020-11-18 ENCOUNTER — Encounter: Payer: Self-pay | Admitting: Internal Medicine

## 2020-11-18 ENCOUNTER — Ambulatory Visit: Payer: Medicare PPO | Admitting: Gastroenterology

## 2020-11-18 VITALS — BP 146/80 | HR 56 | Ht 68.0 in | Wt 153.0 lb

## 2020-11-18 DIAGNOSIS — Z8719 Personal history of other diseases of the digestive system: Secondary | ICD-10-CM | POA: Diagnosis not present

## 2020-11-18 DIAGNOSIS — Z86010 Personal history of colonic polyps: Secondary | ICD-10-CM | POA: Diagnosis not present

## 2020-11-18 DIAGNOSIS — Z1283 Encounter for screening for malignant neoplasm of skin: Secondary | ICD-10-CM | POA: Diagnosis not present

## 2020-11-18 DIAGNOSIS — K2100 Gastro-esophageal reflux disease with esophagitis, without bleeding: Secondary | ICD-10-CM | POA: Diagnosis not present

## 2020-11-18 DIAGNOSIS — D225 Melanocytic nevi of trunk: Secondary | ICD-10-CM | POA: Diagnosis not present

## 2020-11-18 NOTE — Patient Instructions (Signed)
Thank you for choosing me and [facility].  Malcolm T. Stark, Jr., MD., FACG  

## 2020-11-18 NOTE — Progress Notes (Signed)
    History of Present Illness: This is a 74 year old male with erosive esophagitis and an esophageal stricture returning for follow-up.  He underwent EGD with esophageal stricture dilation and colonoscopy with polypectomy in June 2021.  His reflux symptoms are currently well controlled.  He denies dysphagia or odynophagia. No GI complaints.  We briefly reviewed his hospitalization in early October for an actue enteritis.  He had questions about the findings on the CT AP performed during that hospitalization.  Current Medications, Allergies, Past Medical History, Past Surgical History, Family History and Social History were reviewed in [employer] record.   Physical Exam: General: Well developed, well nourished, no acute distress Head: Normocephalic and atraumatic Eyes:  sclerae anicteric, EOMI Ears: Normal auditory acuity Mouth: Not examined, mask on during Covid-19 pandemic Lungs: Clear throughout to auscultation Heart: Regular rate and rhythm; no murmurs, rubs or bruits Abdomen: Soft, non tender and non distended. No masses, hepatosplenomegaly or hernias noted. Normal Bowel sounds Rectal: Not done Musculoskeletal: Symmetrical with no gross deformities  Pulses:  Normal pulses noted Extremities: No clubbing, cyanosis, edema or deformities noted Neurological: Alert oriented x 4, grossly nonfocal Psychological:  Alert and cooperative. Normal mood and affect   Assessment and Recommendations:  1.  GERD with LA class B erosive esophagitis, an esophageal stricture and a moderate sized hiatal hernia.  Follow antireflux measures long-term.  Continue pantoprazole 40 mg p.o. daily.  2.  Personal history of adenomatous colon polyps and sessile serrated polyps.  Surveillance colonoscopy recommended in June 2026.  3. Acute enteritis requiring brief hospitalization.  All symptoms fully resolved.  Reviewed CT AP findings performed during that hospitalization and addressed his  questions to his satisfaction.

## 2020-12-01 DIAGNOSIS — M25561 Pain in right knee: Secondary | ICD-10-CM | POA: Diagnosis not present

## 2020-12-05 DIAGNOSIS — Z5189 Encounter for other specified aftercare: Secondary | ICD-10-CM | POA: Diagnosis not present

## 2020-12-10 ENCOUNTER — Other Ambulatory Visit: Payer: Self-pay

## 2020-12-10 NOTE — Patient Instructions (Addendum)
Blood work was ordered.     No immunization administered today.    Medications changes include :   none     a Ct of your heart was ordered.     Please followup in 1 year    Health Maintenance, Male Adopting a healthy lifestyle and getting preventive care are important in promoting health and wellness. Ask your health care provider about:  The right schedule for you to have regular tests and exams.  Things you can do on your own to prevent diseases and keep yourself healthy. What should I know about diet, weight, and exercise? Eat a healthy diet   Eat a diet that includes plenty of vegetables, fruits, low-fat dairy products, and lean protein.  Do not eat a lot of foods that are high in solid fats, added sugars, or sodium. Maintain a healthy weight Body mass index (BMI) is a measurement that can be used to identify possible weight problems. It estimates body fat based on height and weight. Your health care provider can help determine your BMI and help you achieve or maintain a healthy weight. Get regular exercise Get regular exercise. This is one of the most important things you can do for your health. Most adults should:  Exercise for at least 150 minutes each week. The exercise should increase your heart rate and make you sweat (moderate-intensity exercise).  Do strengthening exercises at least twice a week. This is in addition to the moderate-intensity exercise.  Spend less time sitting. Even light physical activity can be beneficial. Watch cholesterol and blood lipids Have your blood tested for lipids and cholesterol at 72 years of age, then have this test every 5 years. You may need to have your cholesterol levels checked more often if:  Your lipid or cholesterol levels are high.  You are older than 72 years of age.  You are at high risk for heart disease. What should I know about cancer screening? Many types of cancers can be detected early and may often be  prevented. Depending on your health history and family history, you may need to have cancer screening at various ages. This may include screening for:  Colorectal cancer.  Prostate cancer.  Skin cancer.  Lung cancer. What should I know about heart disease, diabetes, and high blood pressure? Blood pressure and heart disease  High blood pressure causes heart disease and increases the risk of stroke. This is more likely to develop in people who have high blood pressure readings, are of African descent, or are overweight.  Talk with your health care provider about your target blood pressure readings.  Have your blood pressure checked: ? Every 3-5 years if you are 78-68 years of age. ? Every year if you are 40 years old or older.  If you are between the ages of 42 and 36 and are a current or former smoker, ask your health care provider if you should have a one-time screening for abdominal aortic aneurysm (AAA). Diabetes Have regular diabetes screenings. This checks your fasting blood sugar level. Have the screening done:  Once every three years after age 52 if you are at a normal weight and have a low risk for diabetes.  More often and at a younger age if you are overweight or have a high risk for diabetes. What should I know about preventing infection? Hepatitis B If you have a higher risk for hepatitis B, you should be screened for this virus. Talk with your health care provider to  find out if you are at risk for hepatitis B infection. Hepatitis C Blood testing is recommended for:  Everyone born from [birth year] through 1963.  Anyone with known risk factors for hepatitis C. Sexually transmitted infections (STIs)  You should be screened each year for STIs, including gonorrhea and chlamydia, if: ? You are sexually active and are younger than 72 years of age. ? You are older than 72 years of age and your health care provider tells you that you are at risk for this type of  infection. ? Your sexual activity has changed since you were last screened, and you are at increased risk for chlamydia or gonorrhea. Ask your health care provider if you are at risk.  Ask your health care provider about whether you are at high risk for HIV. Your health care provider may recommend a prescription medicine to help prevent HIV infection. If you choose to take medicine to prevent HIV, you should first get tested for HIV. You should then be tested every 3 months for as long as you are taking the medicine. Follow these instructions at home: Lifestyle  Do not use any products that contain nicotine or tobacco, such as cigarettes, e-cigarettes, and chewing tobacco. If you need help quitting, ask your health care provider.  Do not use street drugs.  Do not share needles.  Ask your health care provider for help if you need support or information about quitting drugs. Alcohol use  Do not drink alcohol if your health care provider tells you not to drink.  If you drink alcohol: ? Limit how much you have to 0-2 drinks a day. ? Be aware of how much alcohol is in your drink. In the [place] one drink equals one 12 oz bottle of beer (355 mL), one 5 oz glass of wine (148 mL), or one 1 oz glass of hard liquor (44 mL). General instructions  Schedule regular health, dental, and eye exams.  Stay current with your vaccines.  Tell your health care provider if: ? You often feel depressed. ? You have ever been abused or do not feel safe at home. Summary  Adopting a healthy lifestyle and getting preventive care are important in promoting health and wellness.  Follow your health care provider's instructions about healthy diet, exercising, and getting tested or screened for diseases.  Follow your health care provider's instructions on monitoring your cholesterol and blood pressure. This information is not intended to replace advice given to you by your health care provider. Make sure you  discuss any questions you have with your health care provider. Document Revised: 12/06/2018 Document Reviewed: 12/06/2018 Elsevier Patient Education  2020 [employer].

## 2020-12-10 NOTE — Progress Notes (Signed)
Subjective:    Patient ID: Brian [name], male    DOB: 1946-11-22, 74 y.o.   MRN: [id number]  HPI He is here for a physical exam.   He was hospitalized 10/2 for gastroenteritis and sepsis.  It was likely related to something he ate.   He denies other changes and has no concerns.   Medications and allergies reviewed with patient and updated if appropriate.  Patient Active Problem List   Diagnosis Date Noted  . GERD with esophagitis 11/18/2020  . Atherosclerosis of aorta ([place]) 09/29/2020  . Sepsis ([place]) 09/27/2020  . Dysphagia 12/10/2019  . Allergic rhinitis 11/23/2016  . BPV (benign positional vertigo) 11/19/2015  . Multiple food allergies   . 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 History of prostate cancer 03/20/2012  . Diverticulosis of large intestine 12/02/2008  . History of colonic polyps 12/02/2008  . Low HDL (under 40) 02/12/2008  . Headache 02/12/2008    Current Outpatient Medications on File Prior to Visit  Medication Sig Dispense Refill  . EPIPEN 2-PAK 0.3 MG/0.3ML SOAJ injection Inject 1 mg into the muscle as needed.    . fish oil-omega-3 fatty acids 1000 MG capsule Take 1 g by mouth daily. Takes 500 mg daily    . glucosamine-chondroitin 500-400 MG tablet Take 1 tablet by mouth daily.     . Multiple Vitamins-Minerals (MACUVITE PO) Take by mouth.    . pantoprazole (PROTONIX) 40 MG tablet Take 1 tablet (40 mg total) by mouth daily. 90 tablet 3  . PRESCRIPTION MEDICATION Inject into the muscle every 30 (thirty) days. ALLERGY INJECTION'S MONTHLY    . SUMAtriptan (IMITREX) 100 MG tablet Take 25 mg by mouth every 2 (two) hours as needed.     . vardenafil (LEVITRA) 20 MG tablet Take 1 tablet (20 mg total) by mouth daily as needed. Takes 5 mg daily (cuts tablet in 1/4) 10 tablet 2  . vitamin C (ASCORBIC ACID) 500 MG tablet Take 500 mg by mouth daily.      No current  facility-administered medications on file prior to visit.    Past Medical History:  Diagnosis Date  . Arthritis LOW BACK AND KNEES  . Esophageal stricture   . Hypertension   . Migraine headache    Dr Domingo Cocking  . Multiple food allergies   . Nocturia   . Prostate cancer ([place]) 06/14/12   Gleason 3+3=6, volume 33.84 cc  . 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 Tubular adenoma of colon 2011    Past Surgical History:  Procedure Laterality Date  . BIOPSY PROSTATE  06/14/2012   [facility] OFF[facility]  . COLONOSCOPY  05/29/2010    "A sessile polyp was found in the ascending colon.  It was 5 mm in size.   A sessile polyp was found the the distal transverse colon.  It was 3 mm in size.   Moderate diverticulosis was found in the sigmoid colon."   . COLONOSCOPY  06/17/2020  . CYSTOSCOPY  10/26/2012   Procedure: CYSTOSCOPY FLEXIBLE;  Surgeon: Malka So, MD;  Location: [facility];  Service: Urology;  Laterality: N/A;  . HEMORRHOID SURGERY  05-19-2006   [facility]  . POLYPECTOMY    . RADIOACTIVE SEED IMPLANT  10/26/2012   Procedure: RADIOACTIVE SEED IMPLANT;  Surgeon: Malka So, MD;  Location[facility];  Service: Urology;  Laterality: N/A;  .  TONSILLECTOMY  4020   74 years old  . UPPER GASTROINTESTINAL ENDOSCOPY  06/17/2020  . WRIST ARTHROSCOPY  03-27-2003   DEBRIDEMENT VOLAR ULNAR  & SCAPHOLUNATE LIGAMENT TEARS OF RIGHT WRIST    Social History   Socioeconomic History  . Marital status: Married    Spouse name: Not on file  . Number of children: Not on file  . Years of education: Not on file  . Highest education level: Not on file  Occupational History  . Occupation: OWNER    Employer: [employer]    Comment: Manufactures aircraft parts  Tobacco Use  . Smoking status: Never Smoker  . Smokeless tobacco: Never Used  Substance and Sexual Activity  . Alcohol use: No  . Drug use: No  . Sexual activity: Yes  Other Topics Concern  . Not on file  Social  History Narrative   Married over 31 years      2 daughters      03/20/12 PSA 4.03   Social Determinants of Health   Financial Resource Strain: Not on file  Food Insecurity: Not on file  Transportation Needs: Not on file  Physical Activity: Not on file  Stress: Not on file  Social Connections: Not on file    Family History  Problem Relation Age of Onset  . Hypertension Father   . Heart failure Father   . Colon polyps Father   . Diabetes Mother   . Hypertension Mother   . Hypertension Maternal Grandmother   . Diabetes Maternal Grandmother   . Stroke Maternal Grandfather        > 55  . Bone cancer Paternal Grandfather   . Colon cancer Neg Hx   . Esophageal cancer Neg Hx   . Rectal cancer Neg Hx   . Stomach cancer Neg Hx     Review of Systems  Constitutional: Negative for chills, fatigue and fever.  HENT: Negative for trouble swallowing.   Eyes: Negative for visual disturbance.  Respiratory: Negative for cough, shortness of breath and wheezing.   Cardiovascular: Positive for palpitations (occ - years). Negative for chest pain and leg swelling.  Gastrointestinal: Negative for abdominal pain, blood in stool, constipation, diarrhea and nausea.  Genitourinary: Negative for difficulty urinating, dysuria and hematuria.  Musculoskeletal: Positive for arthralgias (knee).  Skin: Negative for color change and rash.  Neurological: Positive for headaches (rare migraines). Negative for dizziness, light-headedness and numbness.  Psychiatric/Behavioral: Negative for dysphoric mood and sleep disturbance. The patient is not nervous/anxious.        Objective:   Vitals:   12/11/20 0807  BP: 140/80  Pulse: (!) 56  Temp: 98 F (36.7 C)  SpO2: 97%   Filed Weights   12/11/20 0807  Weight: 154 lb 12.8 oz (70.2 kg)   Body mass index is 23.54 kg/m.  BP Readings from Last 3 Encounters:  12/11/20 140/80  11/18/20 (!) 146/80  09/28/20 125/63    Wt Readings from Last 3  Encounters:  12/11/20 154 lb 12.8 oz (70.2 kg)  11/18/20 153 lb (69.4 kg)  09/27/20 150 lb (68 kg)     Physical Exam Constitutional: He appears well-developed and well-nourished. No distress.  HENT:  Head: Normocephalic and atraumatic.  Right Ear: External ear normal.  Left Ear: External ear normal.  Mouth/Throat: Oropharynx is clear and moist.  Normal ear canals and TM b/l  Eyes: Conjunctivae and EOM are normal.  Neck: Neck supple. No tracheal deviation present. No thyromegaly present.  No carotid bruit  Cardiovascular: Normal rate, regular rhythm, normal heart sounds and intact distal pulses.   No murmur heard. Pulmonary/Chest: Effort normal and breath sounds normal. No respiratory distress. He has no wheezes. He has no rales.  Abdominal: Soft. He exhibits no distension. There is no tenderness.  Genitourinary: deferred  Musculoskeletal: He exhibits no edema.  Lymphadenopathy:   He has no cervical adenopathy.  Skin: Skin is warm and dry. He is not diaphoretic.  Psychiatric: He has a normal mood and affect. His behavior is normal.    The 10-year ASCVD risk score Mikey Bussing DC Jr., et al., 2013) is: 22.6%   Values used to calculate the score:     Age: 9 years     Sex: Male     Is Non-Hispanic African American: No     Diabetic: No     Tobacco smoker: No     Systolic Blood Pressure: 552 mmHg     Is BP treated: No     HDL Cholesterol: 38.7 mg/dL     Total Cholesterol: 141 mg/dL      Assessment & Plan:   Physical exam: Screening blood work  ordered Immunizations  All up to date Colonoscopy   Up to date  Eye exams   Up to date  Exercise   regular Weight  Normal. Substance abuse    none Sees derm    See Problem List for Assessment and Plan of chronic medical problems.   This visit occurred during the SARS-CoV-2 public health emergency.  Safety protocols were in place, including screening questions prior to the visit, additional usage of staff PPE, and extensive cleaning  of exam room while observing appropriate contact time as indicated for disinfecting solutions.

## 2020-12-11 ENCOUNTER — Ambulatory Visit (INDEPENDENT_AMBULATORY_CARE_PROVIDER_SITE_OTHER): Payer: Medicare PPO | Admitting: Internal Medicine

## 2020-12-11 ENCOUNTER — Encounter: Payer: Self-pay | Admitting: Internal Medicine

## 2020-12-11 VITALS — BP 140/80 | HR 56 | Temp 98.0°F | Ht 68.0 in | Wt 154.8 lb

## 2020-12-11 DIAGNOSIS — Z0000 Encounter for general adult medical examination without abnormal findings: Secondary | ICD-10-CM | POA: Diagnosis not present

## 2020-12-11 DIAGNOSIS — K2100 Gastro-esophageal reflux disease with esophagitis, without bleeding: Secondary | ICD-10-CM | POA: Diagnosis not present

## 2020-12-11 DIAGNOSIS — I700 Atherosclerosis of aorta: Secondary | ICD-10-CM | POA: Diagnosis not present

## 2020-12-11 LAB — CBC WITH DIFFERENTIAL/PLATELET
Basophils Absolute: 0.1 10*3/uL (ref 0.0–0.1)
Basophils Relative: 1.3 % (ref 0.0–3.0)
Eosinophils Absolute: 0.1 10*3/uL (ref 0.0–0.7)
Eosinophils Relative: 2.7 % (ref 0.0–5.0)
HCT: 43.6 % (ref 39.0–52.0)
Hemoglobin: 14.3 g/dL (ref 13.0–17.0)
Lymphocytes Relative: 28.1 % (ref 12.0–46.0)
Lymphs Abs: 1.3 10*3/uL (ref 0.7–4.0)
MCHC: 32.7 g/dL (ref 30.0–36.0)
MCV: 85.3 fl (ref 78.0–100.0)
Monocytes Absolute: 0.5 10*3/uL (ref 0.1–1.0)
Monocytes Relative: 11.1 % (ref 3.0–12.0)
Neutro Abs: 2.6 10*3/uL (ref 1.4–7.7)
Neutrophils Relative %: 56.8 % (ref 43.0–77.0)
Platelets: 216 10*3/uL (ref 150.0–400.0)
RBC: 5.11 Mil/uL (ref 4.22–5.81)
RDW: 14.8 % (ref 11.5–15.5)
WBC: 4.7 10*3/uL (ref 4.0–10.5)

## 2020-12-11 LAB — COMPREHENSIVE METABOLIC PANEL
ALT: 13 U/L (ref 0–53)
AST: 17 U/L (ref 0–37)
Albumin: 4.5 g/dL (ref 3.5–5.2)
Alkaline Phosphatase: 77 U/L (ref 39–117)
BUN: 14 mg/dL (ref 6–23)
CO2: 29 mEq/L (ref 19–32)
Calcium: 9.6 mg/dL (ref 8.4–10.5)
Chloride: 106 mEq/L (ref 96–112)
Creatinine, Ser: 0.92 mg/dL (ref 0.40–1.50)
GFR: 83.17 mL/min (ref >60.00)
Glucose, Bld: 80 mg/dL (ref 70–99)
Potassium: 4.2 mEq/L (ref 3.5–5.1)
Sodium: 140 mEq/L (ref 135–145)
Total Bilirubin: 0.5 mg/dL (ref 0.2–1.2)
Total Protein: 7.2 g/dL (ref 6.0–8.3)

## 2020-12-11 LAB — LIPID PANEL
Cholesterol: 141 mg/dL (ref 0–200)
HDL: 38.7 mg/dL — ABNORMAL LOW (ref >39.00)
LDL Cholesterol: 87 mg/dL (ref 0–99)
NonHDL: 102.67
Total CHOL/HDL Ratio: 4
Triglycerides: 76 mg/dL (ref 0.0–149.0)
VLDL: 15.2 mg/dL (ref 0.0–40.0)

## 2020-12-11 LAB — TSH: TSH: 2.85 u[IU]/mL (ref 0.35–4.50)

## 2020-12-11 NOTE — Assessment & Plan Note (Signed)
Chronic No gerd, but had dysphagia and stricture s/p dilation continue Protonix 40 mg daily

## 2020-12-11 NOTE — Assessment & Plan Note (Signed)
New reviewed recent Ct scan ? Severity, cholesterol is fairly good and he would prefer to avoid a statin He exercises regularly, eats fairly well and his weight is normal Will obtains a Ct coronary calcium scan No statin at this time will await coronary calcium score Continue regular exercise, healthy diet and normal weight

## 2020-12-15 DIAGNOSIS — H52201 Unspecified astigmatism, right eye: Secondary | ICD-10-CM | POA: Diagnosis not present

## 2020-12-15 DIAGNOSIS — H2511 Age-related nuclear cataract, right eye: Secondary | ICD-10-CM | POA: Diagnosis not present

## 2020-12-16 DIAGNOSIS — H2512 Age-related nuclear cataract, left eye: Secondary | ICD-10-CM | POA: Diagnosis not present

## 2020-12-18 ENCOUNTER — Other Ambulatory Visit: Payer: Self-pay

## 2020-12-18 ENCOUNTER — Ambulatory Visit (INDEPENDENT_AMBULATORY_CARE_PROVIDER_SITE_OTHER)
Admission: RE | Admit: 2020-12-18 | Discharge: 2020-12-18 | Disposition: A | Discharge status: Home / Self Care | Payer: Self-pay | Source: Ambulatory Visit | Attending: Internal Medicine | Admitting: Internal Medicine

## 2020-12-18 DIAGNOSIS — I700 Atherosclerosis of aorta: Secondary | ICD-10-CM

## 2020-12-22 DIAGNOSIS — Z20822 Contact with and (suspected) exposure to covid-19: Secondary | ICD-10-CM | POA: Diagnosis not present

## 2020-12-23 ENCOUNTER — Encounter: Payer: Self-pay | Admitting: Internal Medicine

## 2020-12-23 DIAGNOSIS — R931 Abnormal findings on diagnostic imaging of heart and coronary circulation: Secondary | ICD-10-CM | POA: Insufficient documentation

## 2020-12-25 DIAGNOSIS — H2512 Age-related nuclear cataract, left eye: Secondary | ICD-10-CM | POA: Diagnosis not present

## 2020-12-25 DIAGNOSIS — Z961 Presence of intraocular lens: Secondary | ICD-10-CM | POA: Diagnosis not present

## 2020-12-25 DIAGNOSIS — Z9841 Cataract extraction status, right eye: Secondary | ICD-10-CM | POA: Diagnosis not present

## 2020-12-25 DIAGNOSIS — H5211 Myopia, right eye: Secondary | ICD-10-CM | POA: Diagnosis not present

## 2021-01-02 DIAGNOSIS — Z5189 Encounter for other specified aftercare: Secondary | ICD-10-CM | POA: Diagnosis not present

## 2021-01-05 DIAGNOSIS — H2512 Age-related nuclear cataract, left eye: Secondary | ICD-10-CM | POA: Diagnosis not present

## 2021-01-05 DIAGNOSIS — H52202 Unspecified astigmatism, left eye: Secondary | ICD-10-CM | POA: Diagnosis not present

## 2021-01-06 DIAGNOSIS — H25012 Cortical age-related cataract, left eye: Secondary | ICD-10-CM | POA: Diagnosis not present

## 2021-01-06 DIAGNOSIS — H5211 Myopia, right eye: Secondary | ICD-10-CM | POA: Diagnosis not present

## 2021-01-06 DIAGNOSIS — Z9842 Cataract extraction status, left eye: Secondary | ICD-10-CM | POA: Diagnosis not present

## 2021-01-06 DIAGNOSIS — H2512 Age-related nuclear cataract, left eye: Secondary | ICD-10-CM | POA: Diagnosis not present

## 2021-01-30 DIAGNOSIS — Z5189 Encounter for other specified aftercare: Secondary | ICD-10-CM | POA: Diagnosis not present

## 2021-02-27 DIAGNOSIS — Z5189 Encounter for other specified aftercare: Secondary | ICD-10-CM | POA: Diagnosis not present

## 2021-03-11 ENCOUNTER — Ambulatory Visit (INDEPENDENT_AMBULATORY_CARE_PROVIDER_SITE_OTHER): Payer: Medicare PPO

## 2021-03-11 DIAGNOSIS — Z0000 Encounter for general adult medical examination without abnormal findings: Secondary | ICD-10-CM | POA: Diagnosis not present

## 2021-03-11 NOTE — Patient Instructions (Signed)
Brian [name] , Thank you for taking time to come for your Medicare Wellness Visit. I appreciate your ongoing commitment to your health goals. Please review the following plan we discussed and let me know if I can assist you in the future.   Screening recommendations/referrals: Colonoscopy: 06/17/2020; due every 5 years Recommended yearly ophthalmology/optometry visit for glaucoma screening and checkup Recommended yearly dental visit for hygiene and checkup  Vaccinations: Influenza vaccine: 10/02/2020 Pneumococcal vaccine: 01/28/2015, 10/02/2020 Tdap vaccine: 09/07/2018; due every 10 years Shingles vaccine: 01/23/2019, 07/03/2019   Covid-19: 02/01/2020, 02/26/2020, 10/02/2020  Advanced directives: Please bring a copy of your health care power of attorney and living will to the office at your convenience.  Conditions/risks identified: Yes; Reviewed health maintenance screenings with patient today and relevant education, vaccines, and/or referrals were provided. Please continue to do your personal lifestyle choices by: daily care of teeth and gums, regular physical activity (goal should be 5 days a week for 30 minutes), eat a healthy diet, avoid tobacco and drug use, limiting any alcohol intake, taking a low-dose aspirin (if not allergic or have been advised by your provider otherwise) and taking vitamins and minerals as recommended by your provider. Continue doing brain stimulating activities (puzzles, reading, adult coloring books, staying active) to keep memory sharp. Continue to eat heart healthy diet (full of fruits, vegetables, whole grains, lean protein, water--limit salt, fat, and sugar intake) and increase physical activity as tolerated.  Next appointment: Please schedule your next Medicare Wellness Visit with your Nurse Health Advisor in 1 year by calling [phone].  Preventive Care 33 Years and Older, Male Preventive care refers to lifestyle choices and visits with your health care provider that can  promote health and wellness. What does preventive care include?  A yearly physical exam. This is also called an annual well check.  Dental exams once or twice a year.  Routine eye exams. Ask your health care provider how often you should have your eyes checked.  Personal lifestyle choices, including:  Daily care of your teeth and gums.  Regular physical activity.  Eating a healthy diet.  Avoiding tobacco and drug use.  Limiting alcohol use.  Practicing safe sex.  Taking low doses of aspirin every day.  Taking vitamin and mineral supplements as recommended by your health care provider. What happens during an annual well check? The services and screenings done by your health care provider during your annual well check will depend on your age, overall health, lifestyle risk factors, and family history of disease. Counseling  Your health care provider may ask you questions about your:  Alcohol use.  Tobacco use.  Drug use.  Emotional well-being.  Home and relationship well-being.  Sexual activity.  Eating habits.  History of falls.  Memory and ability to understand (cognition).  Work and work Statistician. Screening  You may have the following tests or measurements:  Height, weight, and BMI.  Blood pressure.  Lipid and cholesterol levels. These may be checked every 5 years, or more frequently if you are over 64 years old.  Skin check.  Lung cancer screening. You may have this screening every year starting at age 52 if you have a 30-pack-year history of smoking and currently smoke or have quit within the past 15 years.  Fecal occult blood test (FOBT) of the stool. You may have this test every year starting at age 17.  Flexible sigmoidoscopy or colonoscopy. You may have a sigmoidoscopy every 5 years or a colonoscopy every 10 years starting  at age 45.  Prostate cancer screening. Recommendations will vary depending on your family history and other  risks.  Hepatitis C blood test.  Hepatitis B blood test.  Sexually transmitted disease (STD) testing.  Diabetes screening. This is done by checking your blood sugar (glucose) after you have not eaten for a while (fasting). You may have this done every 1-3 years.  Abdominal aortic aneurysm (AAA) screening. You may need this if you are a current or former smoker.  Osteoporosis. You may be screened starting at age 24 if you are at high risk. Talk with your health care provider about your test results, treatment options, and if necessary, the need for more tests. Vaccines  Your health care provider may recommend certain vaccines, such as:  Influenza vaccine. This is recommended every year.  Tetanus, diphtheria, and acellular pertussis (Tdap, Td) vaccine. You may need a Td booster every 10 years.  Zoster vaccine. You may need this after age 2.  Pneumococcal 13-valent conjugate (PCV13) vaccine. One dose is recommended after age 28.  Pneumococcal polysaccharide (PPSV23) vaccine. One dose is recommended after age 41. Talk to your health care provider about which screenings and vaccines you need and how often you need them. This information is not intended to replace advice given to you by your health care provider. Make sure you discuss any questions you have with your health care provider. Document Released: 01/09/2016 Document Revised: 09/01/2016 Document Reviewed: 10/14/2015 Elsevier Interactive Patient Education  2017 [place] Prevention in the Home Falls can cause injuries. They can happen to people of all ages. There are many things you can do to make your home safe and to help prevent falls. What can I do on the outside of my home?  Regularly fix the edges of walkways and driveways and fix any cracks.  Remove anything that might make you trip as you walk through a door, such as a raised step or threshold.  Trim any bushes or trees on the path to your home.  Use  bright outdoor lighting.  Clear any walking paths of anything that might make someone trip, such as rocks or tools.  Regularly check to see if handrails are loose or broken. Make sure that both sides of any steps have handrails.  Any raised decks and porches should have guardrails on the edges.  Have any leaves, snow, or ice cleared regularly.  Use sand or salt on walking paths during winter.  Clean up any spills in your garage right away. This includes oil or grease spills. What can I do in the bathroom?  Use night lights.  Install grab bars by the toilet and in the tub and shower. Do not use towel bars as grab bars.  Use non-skid mats or decals in the tub or shower.  If you need to sit down in the shower, use a plastic, non-slip stool.  Keep the floor dry. Clean up any water that spills on the floor as soon as it happens.  Remove soap buildup in the tub or shower regularly.  Attach bath mats securely with double-sided non-slip rug tape.  Do not have throw rugs and other things on the floor that can make you trip. What can I do in the bedroom?  Use night lights.  Make sure that you have a light by your bed that is easy to reach.  Do not use any sheets or blankets that are too big for your bed. They should not hang down onto  the floor.  Have a firm chair that has side arms. You can use this for support while you get dressed.  Do not have throw rugs and other things on the floor that can make you trip. What can I do in the kitchen?  Clean up any spills right away.  Avoid walking on wet floors.  Keep items that you use a lot in easy-to-reach places.  If you need to reach something above you, use a strong step stool that has a grab bar.  Keep electrical cords out of the way.  Do not use floor polish or wax that makes floors slippery. If you must use wax, use non-skid floor wax.  Do not have throw rugs and other things on the floor that can make you trip. What can  I do with my stairs?  Do not leave any items on the stairs.  Make sure that there are handrails on both sides of the stairs and use them. Fix handrails that are broken or loose. Make sure that handrails are as long as the stairways.  Check any carpeting to make sure that it is firmly attached to the stairs. Fix any carpet that is loose or worn.  Avoid having throw rugs at the top or bottom of the stairs. If you do have throw rugs, attach them to the floor with carpet tape.  Make sure that you have a light switch at the top of the stairs and the bottom of the stairs. If you do not have them, ask someone to add them for you. What else can I do to help prevent falls?  Wear shoes that:  Do not have high heels.  Have rubber bottoms.  Are comfortable and fit you well.  Are closed at the toe. Do not wear sandals.  If you use a stepladder:  Make sure that it is fully opened. Do not climb a closed stepladder.  Make sure that both sides of the stepladder are locked into place.  Ask someone to hold it for you, if possible.  Clearly mark and make sure that you can see:  Any grab bars or handrails.  First and last steps.  Where the edge of each step is.  Use tools that help you move around (mobility aids) if they are needed. These include:  Canes.  Walkers.  Scooters.  Crutches.  Turn on the lights when you go into a dark area. Replace any light bulbs as soon as they burn out.  Set up your furniture so you have a clear path. Avoid moving your furniture around.  If any of your floors are uneven, fix them.  If there are any pets around you, be aware of where they are.  Review your medicines with your doctor. Some medicines can make you feel dizzy. This can increase your chance of falling. Ask your doctor what other things that you can do to help prevent falls. This information is not intended to replace advice given to you by your health care provider. Make sure you  discuss any questions you have with your health care provider. Document Released: 10/09/2009 Document Revised: 05/20/2016 Document Reviewed: 01/17/2015 Elsevier Interactive Patient Education  2017 [employer].

## 2021-03-11 NOTE — Progress Notes (Signed)
I connected with Brian [name] today by telephone and verified that I am speaking with the correct person using two identifiers. Location patient: home Location provider: work Persons participating in the virtual visit: Brian [name] and Lisette Abu, LPN.   I discussed the limitations, risks, security and privacy concerns of performing an evaluation and management service by telephone and the availability of in person appointments. I also discussed with the patient that there may be a patient responsible charge related to this service. The patient expressed understanding and verbally consented to this telephonic visit.    Interactive audio and video telecommunications were attempted between this provider and patient, however failed, due to patient having technical difficulties OR patient did not have access to video capability.  We continued and completed visit with audio only.  Some vital signs may be absent or patient reported.   Time Spent with patient on telephone encounter: 20 minutes  Subjective:   Brian [name] is a 75 y.o. male who presents for Medicare Annual/Subsequent preventive examination.  Review of Systems    No ROS. Medicare Wellness Virtual Visit. Additional risk factors are reflected in social history. Cardiac Risk Factors include: advanced age (>66men, >35 women);family history of premature cardiovascular disease;hypertension;male gender  Sleep Patterns: No sleep issues, feels rested on waking and sleeps 6-8 hours nightly. Home Safety/Smoke Alarms: Feels safe in home; uses home alarm. Smoke alarms in place. Living environment: 2-story home; Lives with spouse; no needs for DME; good support system. Seat Belt Safety/Bike Helmet: Wears seat belt.    Objective:    There were no vitals filed for this visit. There is no height or weight on file to calculate BMI.  Advanced Directives 03/11/2021 09/27/2020 06/10/2015 09/19/2014 10/26/2012  Does Patient Have a  Medical Advance Directive? Yes Yes Yes (No Data) Patient has advance directive, copy not in chart  Type of Advance Directive Living will;Healthcare Power of [place];Living will - [place];Living will  Does patient want to make changes to medical advance directive? No - Patient declined - - - -  Copy of [place] in Chart? No - copy requested - - - (No Data)  Pre-existing out of facility DNR order (yellow form or pink MOST form) - - - - No    Current Medications (verified) Outpatient Encounter Medications as of 03/11/2021  Medication Sig  . EPIPEN 2-PAK 0.3 MG/0.3ML SOAJ injection Inject 1 mg into the muscle as needed.  . fish oil-omega-3 fatty acids 1000 MG capsule Take 1 g by mouth daily. Takes 500 mg daily  . glucosamine-chondroitin 500-400 MG tablet Take 1 tablet by mouth daily.   . Multiple Vitamins-Minerals (MACUVITE PO) Take by mouth.  . pantoprazole (PROTONIX) 40 MG tablet Take 1 tablet (40 mg total) by mouth daily.  Marland Kitchen PRESCRIPTION MEDICATION Inject into the muscle every 30 (thirty) days. ALLERGY INJECTION'S MONTHLY  . SUMAtriptan (IMITREX) 100 MG tablet Take 25 mg by mouth every 2 (two) hours as needed.   . vardenafil (LEVITRA) 20 MG tablet Take 1 tablet (20 mg total) by mouth daily as needed. Takes 5 mg daily (cuts tablet in 1/4)  . vitamin C (ASCORBIC ACID) 500 MG tablet Take 500 mg by mouth daily.    No facility-administered encounter medications on file as of 03/11/2021.    Allergies (verified) Patient has no known allergies.   History: Past Medical History:  Diagnosis Date  . Arthritis LOW BACK AND KNEES  . Esophageal  stricture   . Hypertension   . Migraine headache    Dr Domingo Cocking  . Multiple food allergies   . Nocturia   . 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 Tubular adenoma of colon 2011   Past Surgical History:  Procedure  Laterality Date  . BIOPSY PROSTATE  06/14/2012   [facility] OFFICE  . COLONOSCOPY  05/29/2010    "A sessile polyp was found in the ascending colon.  It was 5 mm in size.   A sessile polyp was found the the distal transverse colon.  It was 3 mm in size.   Moderate diverticulosis was found in the sigmoid colon."   . COLONOSCOPY  06/17/2020  . CYSTOSCOPY  10/26/2012   Procedure: CYSTOSCOPY FLEXIBLE;  Surgeon: Malka So, MD;  Location: [facility];  Service: Urology;  Laterality: N/A;  . HEMORRHOID SURGERY  05-19-2006   [place]  . POLYPECTOMY    . RADIOACTIVE SEED IMPLANT  10/26/2012   Procedure: RADIOACTIVE SEED IMPLANT;  Surgeon: Malka So, MD;  Location: [facility];  Service: Urology;  Laterality: N/A;  . TONSILLECTOMY  3254   75 years old  . UPPER GASTROINTESTINAL ENDOSCOPY  06/17/2020  . WRIST ARTHROSCOPY  03-27-2003   DEBRIDEMENT VOLAR ULNAR  & SCAPHOLUNATE LIGAMENT TEARS OF RIGHT WRIST   Family History  Problem Relation Age of Onset  . Hypertension Father   . Heart failure Father   . Colon polyps Father   . Diabetes Mother   . Hypertension Mother   . Hypertension Maternal Grandmother   . Diabetes Maternal Grandmother   . Stroke Maternal Grandfather        > 55  . Bone cancer Paternal Grandfather   . Colon cancer Neg Hx   . Esophageal cancer Neg Hx   . Rectal cancer Neg Hx   . Stomach cancer Neg Hx    Social History   Socioeconomic History  . Marital status: Married    Spouse name: Not on file  . Number of children: Not on file  . Years of education: Not on file  . Highest education level: Not on file  Occupational History  . Occupation: OWNER    Employer: [employer]    Comment: Manufactures aircraft parts  Tobacco Use  . Smoking status: Never Smoker  . Smokeless tobacco: Never Used  Substance and Sexual Activity  . Alcohol use: No  . Drug use: No  . Sexual activity: Yes  Other Topics Concern  . Not on file  Social History  Narrative   Married over 66 years      2 daughters      03/20/12 PSA 4.03   Social Determinants of Health   Financial Resource Strain: Low Risk   . Difficulty of Paying Living Expenses: Not hard at all  Food Insecurity: No Food Insecurity  . Worried About Charity fundraiser in the Last Year: Never true  . Ran Out of Food in the Last Year: Never true  Transportation Needs: No Transportation Needs  . Lack of Transportation (Medical): No  . Lack of Transportation (Non-Medical): No  Physical Activity: Sufficiently Active  . Days of Exercise per Week: 7 days  . Minutes of Exercise per Session: 30 min  Stress: No Stress Concern Present  . Feeling of Stress : Not at all  Social Connections: Socially Integrated  . Frequency of Communication  with Friends and Family: More than three times a week  . Frequency of Social Gatherings with Friends and Family: Once a week  . Attends Religious Services: More than 4 times per year  . Active Member of Clubs or Organizations: Yes  . Attends Archivist Meetings: More than 4 times per year  . Marital Status: Married    Tobacco Counseling Counseling given: Not Answered   Clinical Intake:  Pre-visit preparation completed: Yes        Nutritional Risks: None Diabetes: No  How often do you need to have someone help you when you read instructions, pamphlets, or other written materials from your doctor or pharmacy?: 1 - Never What is the last grade level you completed in school?: [place]; 1.5 years Media planner and 2 semesters in college  Diabetic? no  Interpreter Needed?: No  Information entered by :: [employer]. Bathsheba Durrett, LPN   Activities of Daily Living In your present state of health, do you have any difficulty performing the following activities: 03/11/2021 12/11/2020  Hearing? N N  Vision? N N  Difficulty concentrating or making decisions? N N  Walking or climbing stairs? N N  Dressing or bathing? N N   Doing errands, shopping? N N  Preparing Food and eating ? N -  Using the Toilet? N -  In the past six months, have you accidently leaked urine? N -  Do you have problems with loss of bowel control? N -  Managing your Medications? N -  Managing your Finances? N -  Housekeeping or managing your Housekeeping? N -  Some recent data might be hidden    Patient Care Team: Binnie Rail, MD as PCP - General (Internal Medicine)  Indicate any recent Medical Services you may have received from other than [facility] providers in the past year (date may be approximate).     Assessment:   This is a routine wellness examination for [place].  Hearing/Vision screen No exam data present  Dietary issues and exercise activities discussed: Current Exercise Habits: Home exercise routine, Type of exercise: treadmill;walking, Time (Minutes): 30, Frequency (Times/Week): 7, Weekly Exercise (Minutes/Week): 210, Intensity: Moderate, Exercise limited by: None identified  Goals    . Patient Stated     To maintain my current health status by continuing to eat healthy, stay physically active and socially active.      Depression Screen PHQ 2/9 Scores 03/11/2021 12/11/2020 12/10/2019 12/06/2018 11/24/2017 11/23/2016 09/19/2014  PHQ - 2 Score 0 0 0 0 0 0 0    Fall Risk Fall Risk  03/11/2021 12/11/2020 12/10/2019 12/06/2018 11/24/2017  Falls in the past year? 0 0 0 0 No  Number falls in past yr: 0 0 0 0 -  Injury with Fall? 0 0 - - -  Risk for fall due to : No Fall Risks No Fall Risks - - -  Follow up Falls evaluation completed Falls evaluation completed - - -    FALL RISK PREVENTION PERTAINING TO THE HOME:  Any stairs in or around the home? Yes  If so, are there any without handrails? No  Home free of loose throw rugs in walkways, pet beds, electrical cords, etc? Yes  Adequate lighting in your home to reduce risk of falls? Yes   ASSISTIVE DEVICES UTILIZED TO PREVENT FALLS:  Life alert? No  Use of a  cane, walker or w/c? No  Grab bars in the bathroom? Yes  Shower chair or bench in shower? No  Elevated toilet seat or a handicapped toilet? No   TIMED UP AND GO:  Was the test performed? No .  Length of time to ambulate 10 feet: 0 sec.   Gait steady and fast without use of assistive device  Cognitive Function: Patient is cogitatively intact.        Immunizations Immunization History  Administered Date(s) Administered  . Fluad Quad(high Dose 65+) 08/24/2019  . Influenza Whole 10/27/2012, 09/26/2013  . Influenza, High Dose Seasonal PF 09/27/2017, 09/07/2018, 10/02/2018, 10/03/2019, 10/02/2020  . Influenza-Unspecified 09/26/2014, 09/27/2015, 09/26/2016  . PFIZER(Purple Top)SARS-COV-2 Vaccination 02/01/2020, 02/26/2020, 10/02/2020  . Pneumococcal Conjugate-13 01/28/2015  . Pneumococcal Polysaccharide-23 12/14/2013, 09/26/2017, 10/02/2018, 10/03/2019, 10/02/2020  . Tdap 10/01/2011, 09/07/2018  . Zoster 05/04/2011  . Zoster Recombinat (Shingrix) 01/23/2019, 07/03/2019    TDAP status: Up to date  Flu Vaccine status: Up to date  Pneumococcal vaccine status: Up to date  Covid-19 vaccine status: Completed vaccines  Qualifies for Shingles Vaccine? Yes   Zostavax completed Yes   Shingrix Completed?: Yes  Screening Tests Health Maintenance  Topic Date Due  . COVID-19 Vaccine (4 - Booster for Pfizer series) 04/02/2021  . COLONOSCOPY (Pts 45-45yrs Insurance coverage will need to be confirmed)  06/17/2025  . TETANUS/TDAP  09/07/2028  . INFLUENZA VACCINE  Completed  . Hepatitis C Screening  Completed  . PNA vac Low Risk Adult  Completed  . HPV VACCINES  Aged Out    Health Maintenance  There are no preventive care reminders to display for this patient.  Colorectal cancer screening: Type of screening: Colonoscopy. Completed 06/17/2020. Repeat every 5 years  Lung Cancer Screening: (Low Dose CT Chest recommended if Age 71-80 years, 30 pack-year currently smoking OR have quit  w/in 15years.) does not qualify.   Lung Cancer Screening Referral: no  Additional Screening:  Hepatitis C Screening: does qualify; Completed yes  Vision Screening: Recommended annual ophthalmology exams for early detection of glaucoma and other disorders of the eye. Is the patient up to date with their annual eye exam?  Yes  Who is the provider or what is the name of the office in which the patient attends annual eye exams? Marica Otter, OD. If pt is not established with a provider, would they like to be referred to a provider to establish care? No .   Dental Screening: Recommended annual dental exams for proper oral hygiene  Community Resource Referral / Chronic Care Management: CRR required this visit?  No   CCM required this visit?  No      Plan:     I have personally reviewed and noted the following in the patient's chart:   . Medical and social history . Use of alcohol, tobacco or illicit drugs  . Current medications and supplements . Functional ability and status . Nutritional status . Physical activity . Advanced directives . List of other physicians . Hospitalizations, surgeries, and ER visits in previous 12 months . Vitals . Screenings to include cognitive, depression, and falls . Referrals and appointments  In addition, I have reviewed and discussed with patient certain preventive protocols, quality metrics, and best practice recommendations. A written personalized care plan for preventive services as well as general preventive health recommendations were provided to patient.     Sheral Flow, LPN   [id number]   Nurse Notes:  There were no vitals filed for this visit. There is no height or weight on file to calculate BMI. Patient stated that she has no issues with gait or balance;  does not use any assistive devices. Medications reviewed with patient; no opioid use noted.

## 2021-03-18 DIAGNOSIS — R2232 Localized swelling, mass and lump, left upper limb: Secondary | ICD-10-CM | POA: Diagnosis not present

## 2021-03-18 DIAGNOSIS — M720 Palmar fascial fibromatosis [Dupuytren]: Secondary | ICD-10-CM | POA: Insufficient documentation

## 2021-03-27 ENCOUNTER — Other Ambulatory Visit: Payer: Self-pay | Admitting: Orthopedic Surgery

## 2021-03-27 DIAGNOSIS — R2232 Localized swelling, mass and lump, left upper limb: Secondary | ICD-10-CM

## 2021-03-27 DIAGNOSIS — Z5189 Encounter for other specified aftercare: Secondary | ICD-10-CM | POA: Diagnosis not present

## 2021-04-01 DIAGNOSIS — R2232 Localized swelling, mass and lump, left upper limb: Secondary | ICD-10-CM | POA: Diagnosis not present

## 2021-04-24 DIAGNOSIS — Z5189 Encounter for other specified aftercare: Secondary | ICD-10-CM | POA: Diagnosis not present

## 2021-05-07 ENCOUNTER — Telehealth (INDEPENDENT_AMBULATORY_CARE_PROVIDER_SITE_OTHER): Payer: Medicare PPO | Admitting: Internal Medicine

## 2021-05-07 ENCOUNTER — Encounter: Payer: Self-pay | Admitting: Internal Medicine

## 2021-05-07 DIAGNOSIS — A084 Viral intestinal infection, unspecified: Secondary | ICD-10-CM

## 2021-05-07 NOTE — Progress Notes (Deleted)
Subjective:    Patient ID: Brian [name], male    DOB: 11-14-1946, 75 y.o.   MRN: [id number]  HPI The patient is here for an acute visit.   Diarrhea x 5 days - sat am he had diarrhea at 6 am - like getting clearned out for a colonoscopy - multiple times that day.  Got a little better each day.  Last night it came back with a vengence - every 2-3 hrs last night he had diarrhea.  He did eat a little more last night and wondered if that was it.  He was not sure if this was normal.  He took pepto    Home covid neg test is neg.     Review of Systems  Gastrointestinal: Positive for diarrhea. Negative for abdominal pain, blood in stool, nausea and vomiting.  Musculoskeletal: Positive for myalgias (when is started - achy).     Medications and allergies reviewed with patient and updated if appropriate.  Patient Active Problem List   Diagnosis Date Noted  . Agatston coronary artery calcium score less than 100 12/23/2020  . GERD with esophagitis 11/18/2020  . Atherosclerosis of aorta ([place]) 09/29/2020  . Sepsis ([place]) 09/27/2020  . Dysphagia 12/10/2019  . Allergic rhinitis 11/23/2016  . BPV (benign positional vertigo) 11/19/2015  . Multiple food allergies   . 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 History of prostate cancer 03/20/2012  . Diverticulosis of large intestine 12/02/2008  . History of colonic polyps 12/02/2008  . Low HDL (under 40) 02/12/2008  . Headache 02/12/2008    Current Outpatient Medications on File Prior to Visit  Medication Sig Dispense Refill  . EPIPEN 2-PAK 0.3 MG/0.3ML SOAJ injection Inject 1 mg into the muscle as needed.    . fish oil-omega-3 fatty acids 1000 MG capsule Take 1 g by mouth daily. Takes 500 mg daily    . glucosamine-chondroitin 500-400 MG tablet Take 1 tablet by mouth daily.     . Multiple Vitamins-Minerals (MACUVITE PO) Take by mouth.    .  pantoprazole (PROTONIX) 40 MG tablet Take 1 tablet (40 mg total) by mouth daily. 90 tablet 3  . PRESCRIPTION MEDICATION Inject into the muscle every 30 (thirty) days. ALLERGY INJECTION'S MONTHLY    . SUMAtriptan (IMITREX) 100 MG tablet Take 25 mg by mouth every 2 (two) hours as needed.     . vardenafil (LEVITRA) 20 MG tablet Take 1 tablet (20 mg total) by mouth daily as needed. Takes 5 mg daily (cuts tablet in 1/4) 10 tablet 2  . vitamin C (ASCORBIC ACID) 500 MG tablet Take 500 mg by mouth daily.      No current facility-administered medications on file prior to visit.    Past Medical History:  Diagnosis Date  . Arthritis LOW BACK AND KNEES  . Esophageal stricture   . Hypertension   . Migraine headache    Dr Domingo Cocking  . Multiple food allergies   . Nocturia   . Prostate cancer ([place]) 06/14/12   Gleason 3+3=6, volume 33.84 cc  . 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 Tubular adenoma of colon 2011    Past Surgical History:  Procedure Laterality Date  . BIOPSY PROSTATE  06/14/2012   MD OFFICE  . COLONOSCOPY  05/29/2010    "A sessile polyp was found in the ascending colon.  It was 5 mm in size.  A sessile polyp was found the the distal transverse colon.  It was 3 mm in size.   Moderate diverticulosis was found in the sigmoid colon."   . COLONOSCOPY  06/17/2020  . CYSTOSCOPY  10/26/2012   Procedure: CYSTOSCOPY FLEXIBLE;  Surgeon: Malka So, MD;  Location: [facility];  Service: Urology;  Laterality: N/A;  . HEMORRHOID SURGERY  05-19-2006   [place]  . POLYPECTOMY    . RADIOACTIVE SEED IMPLANT  10/26/2012   Procedure: RADIOACTIVE SEED IMPLANT;  Surgeon: Malka So, MD;  Location: [facility];  Service: Urology;  Laterality: N/A;  . TONSILLECTOMY  [id number]   75 years old  . UPPER GASTROINTESTINAL ENDOSCOPY  06/17/2020  . WRIST ARTHROSCOPY  03-27-2003   DEBRIDEMENT VOLAR ULNAR  & SCAPHOLUNATE LIGAMENT TEARS OF RIGHT WRIST    Social History    Socioeconomic History  . Marital status: Married    Spouse name: Not on file  . Number of children: Not on file  . Years of education: Not on file  . Highest education level: Not on file  Occupational History  . Occupation: OWNER    Employer: [employer]    Comment: Manufactures aircraft parts  Tobacco Use  . Smoking status: Never Smoker  . Smokeless tobacco: Never Used  Substance and Sexual Activity  . Alcohol use: No  . Drug use: No  . Sexual activity: Yes  Other Topics Concern  . Not on file  Social History Narrative   Married over 2 years      2 daughters      03/20/12 PSA 4.03   Social Determinants of Health   Financial Resource Strain: Low Risk   . Difficulty of Paying Living Expenses: Not hard at all  Food Insecurity: No Food Insecurity  . Worried About Charity fundraiser in the Last Year: Never true  . Ran Out of Food in the Last Year: Never true  Transportation Needs: No Transportation Needs  . Lack of Transportation (Medical): No  . Lack of Transportation (Non-Medical): No  Physical Activity: Sufficiently Active  . Days of Exercise per Week: 7 days  . Minutes of Exercise per Session: 30 min  Stress: No Stress Concern Present  . Feeling of Stress : Not at all  Social Connections: Socially Integrated  . Frequency of Communication with Friends and Family: More than three times a week  . Frequency of Social Gatherings with Friends and Family: Once a week  . Attends Religious Services: More than 4 times per year  . Active Member of Clubs or Organizations: Yes  . Attends Archivist Meetings: More than 4 times per year  . Marital Status: Married    Family History  Problem Relation Age of Onset  . Hypertension Father   . Heart failure Father   . Colon polyps Father   . Diabetes Mother   . Hypertension Mother   . Hypertension Maternal Grandmother   . Diabetes Maternal Grandmother   . Stroke Maternal Grandfather        > 55  . Bone  cancer Paternal Grandfather   . Colon cancer Neg Hx   . Esophageal cancer Neg Hx   . Rectal cancer Neg Hx   . Stomach cancer Neg Hx     Review of Systems  Gastrointestinal: Positive for diarrhea. Negative for abdominal pain, blood in stool, nausea and vomiting.  Musculoskeletal: Positive for myalgias (when is started - achy).  Objective:  There were no vitals filed for this visit. BP Readings from Last 3 Encounters:  12/11/20 140/80  11/18/20 (!) 146/80  09/28/20 125/63   Wt Readings from Last 3 Encounters:  12/11/20 154 lb 12.8 oz (70.2 kg)  11/18/20 153 lb (69.4 kg)  09/27/20 150 lb (68 kg)   There is no height or weight on file to calculate BMI.   Physical Exam         Assessment & Plan:    See Problem List for Assessment and Plan of chronic medical problems.    This visit occurred during the SARS-CoV-2 public health emergency.  Safety protocols were in place, including screening questions prior to the visit, additional usage of staff PPE, and extensive cleaning of exam room while observing appropriate contact time as indicated for disinfecting solutions.

## 2021-05-07 NOTE — Progress Notes (Signed)
Virtual Visit via Video Note  I connected with Brian [name] on 05/07/21 at 10:20 AM EDT by a video enabled telemedicine application and verified that I am speaking with the correct person using two identifiers.   I discussed the limitations of evaluation and management by telemedicine and the availability of in person appointments. The patient expressed understanding and agreed to proceed.  Present for the visit:  Myself, Dr Billey Gosling, Joni Fears.  The patient is currently at home and I am in the office.    No referring provider.    History of Present Illness: The patient is here for an acute visit.   Diarrhea x 5 days - sat am he had diarrhea at 6 am - like getting clearned out for a colonoscopy - multiple times that day.  Got a little better each day.  Last night it came back with a vengence - every 2-3 hrs last night he had diarrhea.  He did eat a little more last night and wondered if that was it.  He was not sure if this was normal.  He took pepto    Home covid neg test is neg.     Review of Systems  Gastrointestinal: Positive for diarrhea. Negative for abdominal pain, blood in stool, nausea and vomiting.  Musculoskeletal: Positive for myalgias (when is started - achy).    Social History   Socioeconomic History  . Marital status: Married    Spouse name: Not on file  . Number of children: Not on file  . Years of education: Not on file  . Highest education level: Not on file  Occupational History  . Occupation: OWNER    Employer: [employer]    Comment: Manufactures aircraft parts  Tobacco Use  . Smoking status: Never Smoker  . Smokeless tobacco: Never Used  Substance and Sexual Activity  . Alcohol use: No  . Drug use: No  . Sexual activity: Yes  Other Topics Concern  . Not on file  Social History Narrative   Married over 52 years      2 daughters      03/20/12 PSA 4.03   Social Determinants of Health   Financial Resource Strain: Low Risk   .  Difficulty of Paying Living Expenses: Not hard at all  Food Insecurity: No Food Insecurity  . Worried About Charity fundraiser in the Last Year: Never true  . Ran Out of Food in the Last Year: Never true  Transportation Needs: No Transportation Needs  . Lack of Transportation (Medical): No  . Lack of Transportation (Non-Medical): No  Physical Activity: Sufficiently Active  . Days of Exercise per Week: 7 days  . Minutes of Exercise per Session: 30 min  Stress: No Stress Concern Present  . Feeling of Stress : Not at all  Social Connections: Socially Integrated  . Frequency of Communication with Friends and Family: More than three times a week  . Frequency of Social Gatherings with Friends and Family: Once a week  . Attends Religious Services: More than 4 times per year  . Active Member of Clubs or Organizations: Yes  . Attends Archivist Meetings: More than 4 times per year  . Marital Status: Married     Observations/Objective: Appears well in NAD Breathing normally  Assessment and Plan:  See Problem List for Assessment and Plan of chronic medical problems.   Follow Up Instructions:    I discussed the assessment and treatment plan with the patient. The  patient was provided an opportunity to ask questions and all were answered. The patient agreed with the plan and demonstrated an understanding of the instructions.   The patient was advised to call back or seek an in-person evaluation if the symptoms worsen or if the condition fails to improve as anticipated.    Binnie Rail, MD

## 2021-05-07 NOTE — Assessment & Plan Note (Signed)
Acute Symptoms c/w viral gastroenteritis No alarm symptoms, no abd pain Go back to [employer], continue increased fluids Can take pepto if needed Rest Call if symptoms do not improve or worsen

## 2021-05-22 DIAGNOSIS — Z5189 Encounter for other specified aftercare: Secondary | ICD-10-CM | POA: Diagnosis not present

## 2021-06-01 ENCOUNTER — Encounter: Payer: Self-pay | Admitting: Internal Medicine

## 2021-06-01 DIAGNOSIS — E611 Iron deficiency: Secondary | ICD-10-CM

## 2021-06-04 ENCOUNTER — Other Ambulatory Visit: Payer: Self-pay

## 2021-06-04 ENCOUNTER — Other Ambulatory Visit (INDEPENDENT_AMBULATORY_CARE_PROVIDER_SITE_OTHER): Payer: Medicare PPO

## 2021-06-04 DIAGNOSIS — E611 Iron deficiency: Secondary | ICD-10-CM | POA: Diagnosis not present

## 2021-06-04 DIAGNOSIS — K2100 Gastro-esophageal reflux disease with esophagitis, without bleeding: Secondary | ICD-10-CM

## 2021-06-04 LAB — CBC WITH DIFFERENTIAL/PLATELET
Basophils Absolute: 0.1 10*3/uL (ref 0.0–0.1)
Basophils Relative: 1.4 % (ref 0.0–3.0)
Eosinophils Absolute: 0.2 10*3/uL (ref 0.0–0.7)
Eosinophils Relative: 3.5 % (ref 0.0–5.0)
HCT: 37.6 % — ABNORMAL LOW (ref 39.0–52.0)
Hemoglobin: 12.1 g/dL — ABNORMAL LOW (ref 13.0–17.0)
Lymphocytes Relative: 30.7 % (ref 12.0–46.0)
Lymphs Abs: 1.5 10*3/uL (ref 0.7–4.0)
MCHC: 32.3 g/dL (ref 30.0–36.0)
MCV: 80.7 fl (ref 78.0–100.0)
Monocytes Absolute: 0.6 10*3/uL (ref 0.1–1.0)
Monocytes Relative: 11.7 % (ref 3.0–12.0)
Neutro Abs: 2.6 10*3/uL (ref 1.4–7.7)
Neutrophils Relative %: 52.7 % (ref 43.0–77.0)
Platelets: 211 10*3/uL (ref 150.0–400.0)
RBC: 4.65 Mil/uL (ref 4.22–5.81)
RDW: 15.4 % (ref 11.5–15.5)
WBC: 4.9 10*3/uL (ref 4.0–10.5)

## 2021-06-05 LAB — IRON,TIBC AND FERRITIN PANEL
%SAT: 10 % (calc) — ABNORMAL LOW (ref 20–48)
Ferritin: 7 ng/mL — ABNORMAL LOW (ref 24–380)
Iron: 38 ug/dL — ABNORMAL LOW (ref 50–180)
TIBC: 390 mcg/dL (calc) (ref 250–425)

## 2021-06-07 MED ORDER — PANTOPRAZOLE SODIUM 40 MG PO TBEC: 40.0000 mg | DELAYED_RELEASE_TABLET | Freq: Two times a day (BID) | ORAL | 3 refills | Status: DC

## 2021-06-19 DIAGNOSIS — J3089 Other allergic rhinitis: Secondary | ICD-10-CM | POA: Diagnosis not present

## 2021-06-30 DIAGNOSIS — Z5189 Encounter for other specified aftercare: Secondary | ICD-10-CM | POA: Diagnosis not present

## 2021-07-03 DIAGNOSIS — Z5189 Encounter for other specified aftercare: Secondary | ICD-10-CM | POA: Diagnosis not present

## 2021-07-06 DIAGNOSIS — Z5189 Encounter for other specified aftercare: Secondary | ICD-10-CM | POA: Diagnosis not present

## 2021-07-08 ENCOUNTER — Encounter: Payer: Self-pay | Admitting: Gastroenterology

## 2021-07-08 ENCOUNTER — Ambulatory Visit: Payer: Medicare PPO | Admitting: Gastroenterology

## 2021-07-08 VITALS — BP 138/80 | HR 54 | Ht 68.0 in | Wt 150.6 lb

## 2021-07-08 DIAGNOSIS — K2100 Gastro-esophageal reflux disease with esophagitis, without bleeding: Secondary | ICD-10-CM | POA: Diagnosis not present

## 2021-07-08 DIAGNOSIS — D509 Iron deficiency anemia, unspecified: Secondary | ICD-10-CM | POA: Diagnosis not present

## 2021-07-08 NOTE — Patient Instructions (Signed)
Your provider has requested that you go to the basement level for lab work around the week of September 13 th. You do not need an appointment. Press "B" on the elevator. The lab is located at the first door on the left as you exit the elevator.  Please take your iron twice daily.   Due to recent changes in healthcare laws, you may see the results of your imaging and laboratory studies on MyChart before your provider has had a chance to review them.  We understand that in some cases there may be results that are confusing or concerning to you. Not all laboratory results come back in the same time frame and the provider may be waiting for multiple results in order to interpret others.  Please give [place] 48 hours in order for your provider to thoroughly review all the results before contacting the office for clarification of your results.   The [facility] providers would like to encourage you to use [facility] to communicate with providers for non-urgent requests or questions.  Due to long hold times on the telephone, sending your provider a message by [facility] may be a faster and more efficient way to get a response.  Please allow 48 business hours for a response.  Please remember that this is for non-urgent requests.    Thank you for choosing me and [facility].  Pricilla Riffle. Dagoberto Ligas., MD., Marval Regal

## 2021-07-08 NOTE — Progress Notes (Signed)
    History of Present Illness: This is a 75 year old male referred by Dr. Billey Gosling for iron deficiency anemia.  He has no current gastrointestinal complaints.  Recent colonoscopy and EGD as below.  He is a frequent blood donor and generally has given blood about every 3 months for the last several years.  Colonoscopy 05/2020 - Two 7 to 8 mm polyps in the transverse colon, removed with a cold snare. Resected and retrieved. - Moderate diverticulosis in the left colon. - External hemorrhoids. - The examination was otherwise normal on direct and retroflexion views.  EGD 05/2020 - LA Grade B reflux esophagitis with no bleeding. - Benign-appearing esophageal stenosis. Dilated. - Medium-sized hiatal hernia. - Erythematous mucosa in the gastric body and antrum. Biopsied. - Duodenal erosions without bleeding.  Current Medications, Allergies, Past Medical History, Past Surgical History, Family History and Social History were reviewed in [employer] record.   Physical Exam: General: Well developed, well nourished, no acute distress Head: Normocephalic and atraumatic Eyes: Sclerae anicteric, EOMI Ears: Normal auditory acuity Mouth: Not examined, mask on during Covid-19 pandemic Lungs: Clear throughout to auscultation Heart: Regular rate and rhythm; no murmurs, rubs or bruits Abdomen: Soft, non tender and non distended. No masses, hepatosplenomegaly or hernias noted. Normal Bowel sounds Rectal: Not done Musculoskeletal: Symmetrical with no gross deformities  Pulses:  Normal pulses noted Extremities: No clubbing, cyanosis, edema or deformities noted Neurological: Alert oriented x 4, grossly nonfocal Psychological:  Alert and cooperative. Normal mood and affect   Assessment and Recommendations:  Iron deficiency anemia. Frequent blood donation is the likely cause. Esophagitis is also possible.  Recommended that he discontinue donating blood.  Continue FeSO4 325 mg po  bid with meals for 2 months. Repeat CBC, Fe, TIBC, ferritin in 2 months.  GERD with LA Class B esophagitis. Continue pantoprazole 40 mg po bid. Follow antireflux measure.  Personal history of TA and SSP.  A 5-year interval surveillance colonoscopy is recommended in June 2026.

## 2021-07-09 DIAGNOSIS — Z5189 Encounter for other specified aftercare: Secondary | ICD-10-CM | POA: Diagnosis not present

## 2021-07-10 ENCOUNTER — Other Ambulatory Visit: Payer: Self-pay | Admitting: Gastroenterology

## 2021-07-10 DIAGNOSIS — K2100 Gastro-esophageal reflux disease with esophagitis, without bleeding: Secondary | ICD-10-CM

## 2021-07-13 DIAGNOSIS — Z516 Encounter for desensitization to allergens: Secondary | ICD-10-CM | POA: Diagnosis not present

## 2021-08-04 DIAGNOSIS — D225 Melanocytic nevi of trunk: Secondary | ICD-10-CM | POA: Diagnosis not present

## 2021-08-04 DIAGNOSIS — C44311 Basal cell carcinoma of skin of nose: Secondary | ICD-10-CM | POA: Diagnosis not present

## 2021-08-04 DIAGNOSIS — Z1283 Encounter for screening for malignant neoplasm of skin: Secondary | ICD-10-CM | POA: Diagnosis not present

## 2021-08-12 DIAGNOSIS — Z516 Encounter for desensitization to allergens: Secondary | ICD-10-CM | POA: Diagnosis not present

## 2021-09-01 DIAGNOSIS — C44311 Basal cell carcinoma of skin of nose: Secondary | ICD-10-CM | POA: Diagnosis not present

## 2021-09-08 ENCOUNTER — Other Ambulatory Visit (INDEPENDENT_AMBULATORY_CARE_PROVIDER_SITE_OTHER): Payer: Medicare PPO

## 2021-09-08 ENCOUNTER — Other Ambulatory Visit: Payer: Self-pay

## 2021-09-08 DIAGNOSIS — D509 Iron deficiency anemia, unspecified: Secondary | ICD-10-CM | POA: Diagnosis not present

## 2021-09-08 DIAGNOSIS — K2100 Gastro-esophageal reflux disease with esophagitis, without bleeding: Secondary | ICD-10-CM

## 2021-09-08 LAB — CBC WITH DIFFERENTIAL/PLATELET
Basophils Absolute: 0.1 10*3/uL (ref 0.0–0.1)
Basophils Relative: 1.1 % (ref 0.0–3.0)
Eosinophils Absolute: 0.1 10*3/uL (ref 0.0–0.7)
Eosinophils Relative: 2.7 % (ref 0.0–5.0)
HCT: 42.5 % (ref 39.0–52.0)
Hemoglobin: 14 g/dL (ref 13.0–17.0)
Lymphocytes Relative: 26.7 % (ref 12.0–46.0)
Lymphs Abs: 1.4 10*3/uL (ref 0.7–4.0)
MCHC: 32.9 g/dL (ref 30.0–36.0)
MCV: 84.8 fl (ref 78.0–100.0)
Monocytes Absolute: 0.5 10*3/uL (ref 0.1–1.0)
Monocytes Relative: 9.9 % (ref 3.0–12.0)
Neutro Abs: 3.2 10*3/uL (ref 1.4–7.7)
Neutrophils Relative %: 59.6 % (ref 43.0–77.0)
Platelets: 182 10*3/uL (ref 150.0–400.0)
RBC: 5.01 Mil/uL (ref 4.22–5.81)
RDW: 17.6 % — ABNORMAL HIGH (ref 11.5–15.5)
WBC: 5.3 10*3/uL (ref 4.0–10.5)

## 2021-09-08 LAB — IBC + FERRITIN
Ferritin: 31.8 ng/mL (ref 22.0–322.0)
Iron: 62 ug/dL (ref 42–165)
Saturation Ratios: 18.1 % — ABNORMAL LOW (ref 20.0–50.0)
TIBC: 341.6 ug/dL (ref 250.0–450.0)
Transferrin: 244 mg/dL (ref 212.0–360.0)

## 2021-09-11 DIAGNOSIS — Z516 Encounter for desensitization to allergens: Secondary | ICD-10-CM | POA: Diagnosis not present

## 2021-09-28 DIAGNOSIS — G43719 Chronic migraine without aura, intractable, without status migrainosus: Secondary | ICD-10-CM | POA: Diagnosis not present

## 2021-09-28 DIAGNOSIS — G43019 Migraine without aura, intractable, without status migrainosus: Secondary | ICD-10-CM | POA: Diagnosis not present

## 2021-09-29 DIAGNOSIS — C44311 Basal cell carcinoma of skin of nose: Secondary | ICD-10-CM | POA: Diagnosis not present

## 2021-10-07 DIAGNOSIS — Z516 Encounter for desensitization to allergens: Secondary | ICD-10-CM | POA: Diagnosis not present

## 2021-10-20 DIAGNOSIS — C44311 Basal cell carcinoma of skin of nose: Secondary | ICD-10-CM | POA: Diagnosis not present

## 2021-10-22 DIAGNOSIS — H2513 Age-related nuclear cataract, bilateral: Secondary | ICD-10-CM | POA: Diagnosis not present

## 2021-10-22 DIAGNOSIS — Z961 Presence of intraocular lens: Secondary | ICD-10-CM | POA: Diagnosis not present

## 2021-10-22 DIAGNOSIS — H2703 Aphakia, bilateral: Secondary | ICD-10-CM | POA: Diagnosis not present

## 2021-10-22 DIAGNOSIS — H5213 Myopia, bilateral: Secondary | ICD-10-CM | POA: Diagnosis not present

## 2021-10-22 DIAGNOSIS — H25813 Combined forms of age-related cataract, bilateral: Secondary | ICD-10-CM | POA: Diagnosis not present

## 2021-10-22 DIAGNOSIS — R519 Headache, unspecified: Secondary | ICD-10-CM | POA: Diagnosis not present

## 2021-10-26 DIAGNOSIS — Z8546 Personal history of malignant neoplasm of prostate: Secondary | ICD-10-CM | POA: Diagnosis not present

## 2021-10-30 DIAGNOSIS — R519 Headache, unspecified: Secondary | ICD-10-CM | POA: Diagnosis not present

## 2021-10-30 DIAGNOSIS — J301 Allergic rhinitis due to pollen: Secondary | ICD-10-CM | POA: Diagnosis not present

## 2021-10-30 DIAGNOSIS — J3089 Other allergic rhinitis: Secondary | ICD-10-CM | POA: Diagnosis not present

## 2021-11-02 DIAGNOSIS — C61 Malignant neoplasm of prostate: Secondary | ICD-10-CM | POA: Diagnosis not present

## 2021-11-02 DIAGNOSIS — N5201 Erectile dysfunction due to arterial insufficiency: Secondary | ICD-10-CM | POA: Diagnosis not present

## 2021-11-03 ENCOUNTER — Other Ambulatory Visit (INDEPENDENT_AMBULATORY_CARE_PROVIDER_SITE_OTHER): Payer: Medicare PPO

## 2021-11-03 DIAGNOSIS — D509 Iron deficiency anemia, unspecified: Secondary | ICD-10-CM

## 2021-11-03 LAB — CBC
HCT: 43.1 % (ref 39.0–52.0)
Hemoglobin: 14.5 g/dL (ref 13.0–17.0)
MCHC: 33.6 g/dL (ref 30.0–36.0)
MCV: 87.6 fl (ref 78.0–100.0)
Platelets: 193 10*3/uL (ref 150.0–400.0)
RBC: 4.92 Mil/uL (ref 4.22–5.81)
RDW: 14.2 % (ref 11.5–15.5)
WBC: 4.9 10*3/uL (ref 4.0–10.5)

## 2021-11-03 LAB — IBC + FERRITIN
Ferritin: 39.9 ng/mL (ref 22.0–322.0)
Iron: 93 ug/dL (ref 42–165)
Saturation Ratios: 26.7 % (ref 20.0–50.0)
TIBC: 348.6 ug/dL (ref 250.0–450.0)
Transferrin: 249 mg/dL (ref 212.0–360.0)

## 2021-11-04 DIAGNOSIS — Z5189 Encounter for other specified aftercare: Secondary | ICD-10-CM | POA: Diagnosis not present

## 2021-12-04 DIAGNOSIS — Z5189 Encounter for other specified aftercare: Secondary | ICD-10-CM | POA: Diagnosis not present

## 2021-12-07 DIAGNOSIS — J019 Acute sinusitis, unspecified: Secondary | ICD-10-CM | POA: Diagnosis not present

## 2021-12-13 ENCOUNTER — Encounter: Payer: Self-pay | Admitting: Internal Medicine

## 2021-12-13 NOTE — Patient Instructions (Addendum)
Blood work was ordered.      Medications changes include :   None    Please followup in 1 year   Health Maintenance, Male Adopting a healthy lifestyle and getting preventive care are important in promoting health and wellness. Ask your health care provider about: The right schedule for you to have regular tests and exams. Things you can do on your own to prevent diseases and keep yourself healthy. What should I know about diet, weight, and exercise? Eat a healthy diet  Eat a diet that includes plenty of vegetables, fruits, low-fat dairy products, and lean protein. Do not eat a lot of foods that are high in solid fats, added sugars, or sodium. Maintain a healthy weight Body mass index (BMI) is a measurement that can be used to identify possible weight problems. It estimates body fat based on height and weight. Your health care provider can help determine your BMI and help you achieve or maintain a healthy weight. Get regular exercise Get regular exercise. This is one of the most important things you can do for your health. Most adults should: Exercise for at least 150 minutes each week. The exercise should increase your heart rate and make you sweat (moderate-intensity exercise). Do strengthening exercises at least twice a week. This is in addition to the moderate-intensity exercise. Spend less time sitting. Even light physical activity can be beneficial. Watch cholesterol and blood lipids Have your blood tested for lipids and cholesterol at 72 years of age, then have this test every 5 years. You may need to have your cholesterol levels checked more often if: Your lipid or cholesterol levels are high. You are older than 73 years of age. You are at high risk for heart disease. What should I know about cancer screening? Many types of cancers can be detected early and may often be prevented. Depending on your health history and family history, you may need to have cancer screening at  various ages. This may include screening for: Colorectal cancer. Prostate cancer. Skin cancer. Lung cancer. What should I know about heart disease, diabetes, and high blood pressure? Blood pressure and heart disease High blood pressure causes heart disease and increases the risk of stroke. This is more likely to develop in people who have high blood pressure readings or are overweight. Talk with your health care provider about your target blood pressure readings. Have your blood pressure checked: Every 3-5 years if you are 60-84 years of age. Every year if you are 40 years old or older. If you are between the ages of 61 and 46 and are a current or former smoker, ask your health care provider if you should have a one-time screening for abdominal aortic aneurysm (AAA). Diabetes Have regular diabetes screenings. This checks your fasting blood sugar level. Have the screening done: Once every three years after age 7 if you are at a normal weight and have a low risk for diabetes. More often and at a younger age if you are overweight or have a high risk for diabetes. What should I know about preventing infection? Hepatitis B If you have a higher risk for hepatitis B, you should be screened for this virus. Talk with your health care provider to find out if you are at risk for hepatitis B infection. Hepatitis C Blood testing is recommended for: Everyone born from [birth year] through 1963. Anyone with known risk factors for hepatitis C. Sexually transmitted infections (STIs) You should be screened each year for STIs, including  gonorrhea and chlamydia, if: You are sexually active and are younger than 73 years of age. You are older than 73 years of age and your health care provider tells you that you are at risk for this type of infection. Your sexual activity has changed since you were last screened, and you are at increased risk for chlamydia or gonorrhea. Ask your health care provider if you are at  risk. Ask your health care provider about whether you are at high risk for HIV. Your health care provider may recommend a prescription medicine to help prevent HIV infection. If you choose to take medicine to prevent HIV, you should first get tested for HIV. You should then be tested every 3 months for as long as you are taking the medicine. Follow these instructions at home: Alcohol use Do not drink alcohol if your health care provider tells you not to drink. If you drink alcohol: Limit how much you have to 0-2 drinks a day. Know how much alcohol is in your drink. In the [place] one drink equals one 12 oz bottle of beer (355 mL), one 5 oz glass of wine (148 mL), or one 1 oz glass of hard liquor (44 mL). Lifestyle Do not use any products that contain nicotine or tobacco. These products include cigarettes, chewing tobacco, and vaping devices, such as e-cigarettes. If you need help quitting, ask your health care provider. Do not use street drugs. Do not share needles. Ask your health care provider for help if you need support or information about quitting drugs. General instructions Schedule regular health, dental, and eye exams. Stay current with your vaccines. Tell your health care provider if: You often feel depressed. You have ever been abused or do not feel safe at home. Summary Adopting a healthy lifestyle and getting preventive care are important in promoting health and wellness. Follow your health care provider's instructions about healthy diet, exercising, and getting tested or screened for diseases. Follow your health care provider's instructions on monitoring your cholesterol and blood pressure. This information is not intended to replace advice given to you by your health care provider. Make sure you discuss any questions you have with your health care provider. Document Revised: 05/04/2021 Document Reviewed: 05/04/2021 Elsevier Patient Education  [place].

## 2021-12-13 NOTE — Progress Notes (Signed)
Subjective:    Patient ID: Brian [name], male    DOB: 05-27-1946, 75 y.o.   MRN: [id number]   This visit occurred during the SARS-CoV-2 public health emergency.  Safety protocols were in place, including screening questions prior to the visit, additional usage of staff PPE, and extensive cleaning of exam room while observing appropriate contact time as indicated for disinfecting solutions.   HPI He is here for a physical exam.   He denies changes in his health since he was here last year.  He has no concerns.  Medications and allergies reviewed with patient and updated if appropriate.  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     ferrous sulfate 325 (65 FE) MG tablet Take 325 mg by mouth daily with breakfast.     fish oil-omega-3 fatty acids 1000 MG capsule Take 1 g by mouth daily. Takes 500 mg daily     glucosamine-chondroitin 500-400 MG tablet Take 1 tablet by mouth daily.      Multiple Vitamins-Minerals (MACUVITE PO) Take 1 tablet by mouth daily.     pantoprazole (PROTONIX) 40 MG tablet Take 1 tablet (40 mg total) by mouth 2 (two) times daily before a meal. 180 tablet 3    PRESCRIPTION MEDICATION Inject into the muscle every 30 (thirty) days. ALLERGY INJECTION'S MONTHLY     SUMAtriptan (IMITREX) 100 MG tablet Take 25 mg by mouth every 2 (two) hours as needed.      vitamin C (ASCORBIC ACID) 500 MG tablet Take 500 mg by mouth daily.      No current facility-administered medications on file prior to visit.    Past Medical History:  Diagnosis Date   Arthritis LOW BACK AND KNEES   Esophageal stricture    Hypertension    Migraine headache    Dr Domingo Cocking   Multiple food allergies    Nocturia    Prostate cancer ([place]) 06/14/12   Gleason 3+3=6, volume 33.84 cc   Seasonal allergies    on immunotherapy   Tubular adenoma of colon 2011    Past Surgical History:  Procedure Laterality Date   BIOPSY PROSTATE  06/14/2012   MD OFFICE   COLONOSCOPY  05/29/2010    "A sessile polyp was found in the ascending colon.  It was 5 mm in size.   A sessile polyp was found the the distal transverse colon.  It was 3 mm in size.   Moderate diverticulosis was found in the sigmoid colon."    COLONOSCOPY  06/17/2020   CYSTOSCOPY  10/26/2012   Procedure: CYSTOSCOPY FLEXIBLE;  Surgeon: Malka So, MD;  Location: [facility];  Service: Urology;  Laterality: N/A;  HEMORRHOID SURGERY  05-19-2006   [place]   POLYPECTOMY     RADIOACTIVE SEED IMPLANT  10/26/2012   Procedure: RADIOACTIVE SEED IMPLANT;  Surgeon: Malka So, MD;  Location: [facility];  Service: Urology;  Laterality: N/A;   TONSILLECTOMY  3028   75 years old   UPPER GASTROINTESTINAL ENDOSCOPY  06/17/2020   WRIST ARTHROSCOPY  03-27-2003   DEBRIDEMENT VOLAR ULNAR  & SCAPHOLUNATE LIGAMENT TEARS OF RIGHT WRIST    Social History   Socioeconomic History   Marital status: Married    Spouse name: Not on file   Number of children: Not on file   Years of education: Not on file   Highest education level: Not on file  Occupational History   Occupation: OWNER    Employer: [employer]     Comment: Manufactures aircraft parts  Tobacco Use   Smoking status: Never   Smokeless tobacco: Never  Vaping Use   Vaping Use: Never used  Substance and Sexual Activity   Alcohol use: No   Drug use: No   Sexual activity: Yes  Other Topics Concern   Not on file  Social History Narrative   Married over 42 years      2 daughters      03/20/12 PSA 4.03   Social Determinants of Health   Financial Resource Strain: Low Risk    Difficulty of Paying Living Expenses: Not hard at all  Food Insecurity: No Food Insecurity   Worried About Charity fundraiser in the Last Year: Never true   [place] in the Last Year: Never true  Transportation Needs: No Transportation Needs   Lack of Transportation (Medical): No   Lack of Transportation (Non-Medical): No  Physical Activity: Sufficiently Active   Days of Exercise per Week: 7 days   Minutes of Exercise per Session: 30 min  Stress: No Stress Concern Present   Feeling of Stress : Not at all  Social Connections: Socially Integrated   Frequency of Communication with Friends and Family: More than three times a week   Frequency of Social Gatherings with Friends and Family: Once a week   Attends Religious Services: More than 4 times per year   Active Member of [employer] or Organizations: Yes   Attends Music therapist: More than 4 times per year   Marital Status: Married    Family History  Problem Relation Age of Onset   Hypertension Father    Heart failure Father    Colon polyps Father    Diabetes Mother    Hypertension Mother    Hypertension Maternal Grandmother    Diabetes Maternal Grandmother    Stroke Maternal Grandfather        > 74   Bone cancer Paternal Grandfather    Colon cancer Neg Hx    Esophageal cancer Neg Hx    Rectal cancer Neg Hx    Stomach cancer Neg Hx     Review of Systems  Constitutional:  Negative for chills and fever.  Respiratory:  Positive for cough (dry tickle residual from sinus  infection). Negative for shortness of breath and wheezing.   Cardiovascular:  Positive for palpitations (occ). Negative for chest pain and leg swelling.  Gastrointestinal:  Negative for abdominal pain, blood in stool, constipation, diarrhea and nausea.       No gerd  Genitourinary:  Negative for difficulty urinating, dysuria and hematuria.  Musculoskeletal:  Negative for arthralgias and back pain.  Skin:  Negative for color change and rash.  Neurological:  Negative for dizziness, light-headedness, numbness and headaches.  Psychiatric/Behavioral:  Negative for dysphoric mood. The patient is not nervous/anxious.       Objective:   Vitals:   12/14/21 0753  BP: 134/82  Pulse: (!) 55  Temp: 98 F (36.7 C)  SpO2: 99%   Filed Weights   12/14/21 0753  Weight: 154 lb 3.2 oz (69.9 kg)   Body mass index is 23.45 kg/m.  BP Readings from Last 3 Encounters:  12/14/21 134/82  07/08/21 138/80  12/11/20 140/80    Wt Readings from Last 3 Encounters:  12/14/21 154 lb 3.2 oz (69.9 kg)  07/08/21 150 lb 9.6 oz (68.3 kg)  12/11/20 154 lb 12.8 oz (70.2 kg)     Physical Exam Constitutional: He appears well-developed and well-nourished. No distress.  HENT:  Head: Normocephalic and atraumatic.  Right Ear: External ear normal.  Left Ear: External ear normal.  Mouth/Throat: Oropharynx is clear and moist.  Normal ear canals and TM b/l  Eyes: Conjunctivae and EOM are normal.  Neck: Neck supple. No tracheal deviation present. No thyromegaly present.  No carotid bruit  Cardiovascular: Normal rate, regular rhythm, normal heart sounds and intact distal pulses.   No murmur heard. Pulmonary/Chest: Effort normal and breath sounds normal. No respiratory distress. He has no wheezes. He has no rales.  Abdominal: Soft. He exhibits no distension. There is no tenderness.  Genitourinary: deferred  Musculoskeletal: He exhibits no edema.  Lymphadenopathy:   He has no cervical adenopathy.  Skin: Skin is  warm and dry. He is not diaphoretic.  Psychiatric: He has a normal mood and affect. His behavior is normal.         Assessment & Plan:   Physical exam: Screening blood work  ordered Exercise   regular Weight   normal Substance abuse   none   Reviewed recommended immunizations.   Health Maintenance  Topic Date Due   COVID-19 Vaccine (4 - Booster for Pfizer series) 11/27/2020   COLONOSCOPY (Pts 45-55yrs Insurance coverage will need to be confirmed)  06/17/2025   TETANUS/TDAP  09/07/2028   Pneumonia Vaccine 27+ Years old  Completed   INFLUENZA VACCINE  Completed   Hepatitis C Screening  Completed   Zoster Vaccines- Shingrix  Completed   HPV VACCINES  Aged Out     See Problem List for Assessment and Plan of chronic medical problems.   Follow-up annually

## 2021-12-14 ENCOUNTER — Other Ambulatory Visit: Payer: Self-pay

## 2021-12-14 ENCOUNTER — Ambulatory Visit (INDEPENDENT_AMBULATORY_CARE_PROVIDER_SITE_OTHER): Payer: Medicare PPO | Admitting: Internal Medicine

## 2021-12-14 VITALS — BP 134/82 | HR 55 | Temp 98.0°F | Ht 68.0 in | Wt 154.2 lb

## 2021-12-14 DIAGNOSIS — Z8546 Personal history of malignant neoplasm of prostate: Secondary | ICD-10-CM | POA: Diagnosis not present

## 2021-12-14 DIAGNOSIS — K2100 Gastro-esophageal reflux disease with esophagitis, without bleeding: Secondary | ICD-10-CM | POA: Diagnosis not present

## 2021-12-14 DIAGNOSIS — I700 Atherosclerosis of aorta: Secondary | ICD-10-CM | POA: Diagnosis not present

## 2021-12-14 DIAGNOSIS — Z0000 Encounter for general adult medical examination without abnormal findings: Secondary | ICD-10-CM | POA: Diagnosis not present

## 2021-12-14 LAB — LIPID PANEL
Cholesterol: 160 mg/dL (ref 0–200)
HDL: 35.6 mg/dL — ABNORMAL LOW (ref >39.00)
LDL Cholesterol: 96 mg/dL (ref 0–99)
NonHDL: 124.41
Total CHOL/HDL Ratio: 4
Triglycerides: 143 mg/dL (ref 0.0–149.0)
VLDL: 28.6 mg/dL (ref 0.0–40.0)

## 2021-12-14 LAB — COMPREHENSIVE METABOLIC PANEL
ALT: 23 U/L (ref 0–53)
AST: 24 U/L (ref 0–37)
Albumin: 4.4 g/dL (ref 3.5–5.2)
Alkaline Phosphatase: 88 U/L (ref 39–117)
BUN: 14 mg/dL (ref 6–23)
CO2: 27 mEq/L (ref 19–32)
Calcium: 9.5 mg/dL (ref 8.4–10.5)
Chloride: 105 mEq/L (ref 96–112)
Creatinine, Ser: 0.88 mg/dL (ref 0.40–1.50)
GFR: 85.36 mL/min (ref >60.00)
Glucose, Bld: 87 mg/dL (ref 70–99)
Potassium: 4.7 mEq/L (ref 3.5–5.1)
Sodium: 139 mEq/L (ref 135–145)
Total Bilirubin: 0.8 mg/dL (ref 0.2–1.2)
Total Protein: 6.9 g/dL (ref 6.0–8.3)

## 2021-12-14 LAB — CBC WITH DIFFERENTIAL/PLATELET
Basophils Absolute: 0.1 10*3/uL (ref 0.0–0.1)
Basophils Relative: 1.3 % (ref 0.0–3.0)
Eosinophils Absolute: 0.2 10*3/uL (ref 0.0–0.7)
Eosinophils Relative: 3.7 % (ref 0.0–5.0)
HCT: 46.3 % (ref 39.0–52.0)
Hemoglobin: 15.4 g/dL (ref 13.0–17.0)
Lymphocytes Relative: 25 % (ref 12.0–46.0)
Lymphs Abs: 1.4 10*3/uL (ref 0.7–4.0)
MCHC: 33.4 g/dL (ref 30.0–36.0)
MCV: 88.2 fl (ref 78.0–100.0)
Monocytes Absolute: 0.6 10*3/uL (ref 0.1–1.0)
Monocytes Relative: 10.6 % (ref 3.0–12.0)
Neutro Abs: 3.4 10*3/uL (ref 1.4–7.7)
Neutrophils Relative %: 59.4 % (ref 43.0–77.0)
Platelets: 195 10*3/uL (ref 150.0–400.0)
RBC: 5.25 Mil/uL (ref 4.22–5.81)
RDW: 13.9 % (ref 11.5–15.5)
WBC: 5.7 10*3/uL (ref 4.0–10.5)

## 2021-12-14 LAB — TSH: TSH: 3.62 u[IU]/mL (ref 0.35–5.50)

## 2021-12-14 NOTE — Assessment & Plan Note (Signed)
Chronic Coronary calcium score 17 and 2021 and cholesterol reasonably controlled with lifestyle alone so not currently on medication Continue regular exercise, healthy diet

## 2021-12-14 NOTE — Assessment & Plan Note (Signed)
Chronic GERD controlled Continue pantoprazole 20 mg twice daily 

## 2021-12-14 NOTE — Assessment & Plan Note (Signed)
History of prostate cancer Following with urology No evidence of recurrence 

## 2021-12-15 DIAGNOSIS — Z08 Encounter for follow-up examination after completed treatment for malignant neoplasm: Secondary | ICD-10-CM | POA: Diagnosis not present

## 2021-12-15 DIAGNOSIS — Z85828 Personal history of other malignant neoplasm of skin: Secondary | ICD-10-CM | POA: Diagnosis not present

## 2022-01-01 DIAGNOSIS — Z5189 Encounter for other specified aftercare: Secondary | ICD-10-CM | POA: Diagnosis not present

## 2022-01-28 DIAGNOSIS — Z5189 Encounter for other specified aftercare: Secondary | ICD-10-CM | POA: Diagnosis not present

## 2022-02-26 DIAGNOSIS — Z5189 Encounter for other specified aftercare: Secondary | ICD-10-CM | POA: Diagnosis not present

## 2022-03-22 ENCOUNTER — Ambulatory Visit (INDEPENDENT_AMBULATORY_CARE_PROVIDER_SITE_OTHER): Payer: Medicare PPO

## 2022-03-22 DIAGNOSIS — Z0000 Encounter for general adult medical examination without abnormal findings: Secondary | ICD-10-CM

## 2022-03-22 NOTE — Patient Instructions (Signed)
Brian [name] , ?Thank you for taking time to come for your Medicare Wellness Visit. I appreciate your ongoing commitment to your health goals. Please review the following plan we discussed and let me know if I can assist you in the future.  ? ?Screening recommendations/referrals: ?Colonoscopy: 06/17/2020 ?Recommended yearly ophthalmology/optometry visit for glaucoma screening and checkup ?Recommended yearly dental visit for hygiene and checkup ? ?Vaccinations: ?Influenza vaccine: completed  ?Pneumococcal vaccine: completed  ?Tdap vaccine: 09/07/2018 ?Shingles vaccine: completed    ? ?Advanced directives: yes  ? ?Conditions/risks identified: none  ? ?Next appointment: none  ? ?Preventive Care 74 Years and Older, Male ?Preventive care refers to lifestyle choices and visits with your health care provider that can promote health and wellness. ?What does preventive care include? ?A yearly physical exam. This is also called an annual well check. ?Dental exams once or twice a year. ?Routine eye exams. Ask your health care provider how often you should have your eyes checked. ?Personal lifestyle choices, including: ?Daily care of your teeth and gums. ?Regular physical activity. ?Eating a healthy diet. ?Avoiding tobacco and drug use. ?Limiting alcohol use. ?Practicing safe sex. ?Taking low doses of aspirin every day. ?Taking vitamin and mineral supplements as recommended by your health care provider. ?What happens during an annual well check? ?The services and screenings done by your health care provider during your annual well check will depend on your age, overall health, lifestyle risk factors, and family history of disease. ?Counseling  ?Your health care provider may ask you questions about your: ?Alcohol use. ?Tobacco use. ?Drug use. ?Emotional well-being. ?Home and relationship well-being. ?Sexual activity. ?Eating habits. ?History of falls. ?Memory and ability to understand (cognition). ?Work and work  Statistician. ?Screening  ?You may have the following tests or measurements: ?Height, weight, and BMI. ?Blood pressure. ?Lipid and cholesterol levels. These may be checked every 5 years, or more frequently if you are over 35 years old. ?Skin check. ?Lung cancer screening. You may have this screening every year starting at age 42 if you have a 30-pack-year history of smoking and currently smoke or have quit within the past 15 years. ?Fecal occult blood test (FOBT) of the stool. You may have this test every year starting at age 38. ?Flexible sigmoidoscopy or colonoscopy. You may have a sigmoidoscopy every 5 years or a colonoscopy every 10 years starting at age 37. ?Prostate cancer screening. Recommendations will vary depending on your family history and other risks. ?Hepatitis C blood test. ?Hepatitis B blood test. ?Sexually transmitted disease (STD) testing. ?Diabetes screening. This is done by checking your blood sugar (glucose) after you have not eaten for a while (fasting). You may have this done every 1-3 years. ?Abdominal aortic aneurysm (AAA) screening. You may need this if you are a current or former smoker. ?Osteoporosis. You may be screened starting at age 17 if you are at high risk. ?Talk with your health care provider about your test results, treatment options, and if necessary, the need for more tests. ?Vaccines  ?Your health care provider may recommend certain vaccines, such as: ?Influenza vaccine. This is recommended every year. ?Tetanus, diphtheria, and acellular pertussis (Tdap, Td) vaccine. You may need a Td booster every 10 years. ?Zoster vaccine. You may need this after age 72. ?Pneumococcal 13-valent conjugate (PCV13) vaccine. One dose is recommended after age 88. ?Pneumococcal polysaccharide (PPSV23) vaccine. One dose is recommended after age 11. ?Talk to your health care provider about which screenings and vaccines you need and how often you need  them. ?This information is not intended to replace  advice given to you by your health care provider. Make sure you discuss any questions you have with your health care provider. ?Document Released: 01/09/2016 Document Revised: 09/01/2016 Document Reviewed: 10/14/2015 ?Elsevier Interactive Patient Education ? 2017 [place]. ? ?Fall Prevention in the Home ?Falls can cause injuries. They can happen to people of all ages. There are many things you can do to make your home safe and to help prevent falls. ?What can I do on the outside of my home? ?Regularly fix the edges of walkways and driveways and fix any cracks. ?Remove anything that might make you trip as you walk through a door, such as a raised step or threshold. ?Trim any bushes or trees on the path to your home. ?Use bright outdoor lighting. ?Clear any walking paths of anything that might make someone trip, such as rocks or tools. ?Regularly check to see if handrails are loose or broken. Make sure that both sides of any steps have handrails. ?Any raised decks and porches should have guardrails on the edges. ?Have any leaves, snow, or ice cleared regularly. ?Use sand or salt on walking paths during winter. ?Clean up any spills in your garage right away. This includes oil or grease spills. ?What can I do in the bathroom? ?Use night lights. ?Install grab bars by the toilet and in the tub and shower. Do not use towel bars as grab bars. ?Use non-skid mats or decals in the tub or shower. ?If you need to sit down in the shower, use a plastic, non-slip stool. ?Keep the floor dry. Clean up any water that spills on the floor as soon as it happens. ?Remove soap buildup in the tub or shower regularly. ?Attach bath mats securely with double-sided non-slip rug tape. ?Do not have throw rugs and other things on the floor that can make you trip. ?What can I do in the bedroom? ?Use night lights. ?Make sure that you have a light by your bed that is easy to reach. ?Do not use any sheets or blankets that are too big for your bed.  They should not hang down onto the floor. ?Have a firm chair that has side arms. You can use this for support while you get dressed. ?Do not have throw rugs and other things on the floor that can make you trip. ?What can I do in the kitchen? ?Clean up any spills right away. ?Avoid walking on wet floors. ?Keep items that you use a lot in easy-to-reach places. ?If you need to reach something above you, use a strong step stool that has a grab bar. ?Keep electrical cords out of the way. ?Do not use floor polish or wax that makes floors slippery. If you must use wax, use non-skid floor wax. ?Do not have throw rugs and other things on the floor that can make you trip. ?What can I do with my stairs? ?Do not leave any items on the stairs. ?Make sure that there are handrails on both sides of the stairs and use them. Fix handrails that are broken or loose. Make sure that handrails are as long as the stairways. ?Check any carpeting to make sure that it is firmly attached to the stairs. Fix any carpet that is loose or worn. ?Avoid having throw rugs at the top or bottom of the stairs. If you do have throw rugs, attach them to the floor with carpet tape. ?Make sure that you have a light switch at  the top of the stairs and the bottom of the stairs. If you do not have them, ask someone to add them for you. ?What else can I do to help prevent falls? ?Wear shoes that: ?Do not have high heels. ?Have rubber bottoms. ?Are comfortable and fit you well. ?Are closed at the toe. Do not wear sandals. ?If you use a stepladder: ?Make sure that it is fully opened. Do not climb a closed stepladder. ?Make sure that both sides of the stepladder are locked into place. ?Ask someone to hold it for you, if possible. ?Clearly mark and make sure that you can see: ?Any grab bars or handrails. ?First and last steps. ?Where the edge of each step is. ?Use tools that help you move around (mobility aids) if they are needed. These  include: ?Canes. ?Walkers. ?Scooters. ?Crutches. ?Turn on the lights when you go into a dark area. Replace any light bulbs as soon as they burn out. ?Set up your furniture so you have a clear path. Avoid moving your furniture around. ?If any of

## 2022-03-22 NOTE — Progress Notes (Signed)
? ?Subjective:  ? Brian [name] is a 76 y.o. male who presents for an Subsequent Medicare Annual Wellness Visit. ? ?I connected with Brian [name] today by telephone and verified that I am speaking with the correct person using two identifiers. ?Location patient: home ?Location provider: work ?Persons participating in the virtual visit: patient, provider. ?  ?I discussed the limitations, risks, security and privacy concerns of performing an evaluation and management service by telephone and the availability of in person appointments. I also discussed with the patient that there may be a patient responsible charge related to this service. The patient expressed understanding and verbally consented to this telephonic visit.  ?  ?Interactive audio and video telecommunications were attempted between this provider and patient, however failed, due to patient having technical difficulties OR patient did not have access to video capability.  We continued and completed visit with audio only. ? ?  ?Review of Systems    ? ?Cardiac Risk Factors include: advanced age (>49mn, >>74women);male gender ? ?   ?Objective:  ?  ?Today's Vitals  ? ?There is no height or weight on file to calculate BMI. ? ? ?  03/22/2022  ?  8:49 AM 03/11/2021  ?  1:28 PM 09/27/2020  ?  3:05 PM 06/10/2015  ? 10:28 AM 10/26/2012  ?  6:59 AM  ?Advanced Directives  ?Does Patient Have a Medical Advance Directive? Yes Yes Yes Yes Patient has advance directive, copy not in chart  ?Type of AParamedicof ARomeoLiving will Living will;Healthcare Power of AVolinLiving will HBellemeadeLiving will  ?Does patient want to make changes to medical advance directive?  No - Patient declined     ?Copy of H[place]ry[place]in Chart? No - copy requested No - copy requested     ?Pre-existing out of facility DNR order (yellow form or pink MOST form)     No  ? ? ?Current Medications  (verified) ?Outpatient Encounter Medications as of 03/22/2022  ?Medication Sig  ? EPIPEN 2-PAK 0.3 MG/0.3ML SOAJ injection Inject 1 mg into the muscle as needed.  ? ferrous sulfate 325 (65 FE) MG tablet Take 325 mg by mouth daily with breakfast.  ? fish oil-omega-3 fatty acids 1000 MG capsule Take 1 g by mouth daily. Takes 500 mg daily  ? glucosamine-chondroitin 500-400 MG tablet Take 1 tablet by mouth daily.   ? Multiple Vitamins-Minerals (MACUVITE PO) Take 1 tablet by mouth daily.  ? pantoprazole (PROTONIX) 40 MG tablet Take 1 tablet (40 mg total) by mouth 2 (two) times daily before a meal.  ? PRESCRIPTION MEDICATION Inject into the muscle every 30 (thirty) days. ALLERGY INJECTION'S MONTHLY  ? SUMAtriptan (IMITREX) 100 MG tablet Take 25 mg by mouth every 2 (two) hours as needed.   ? vitamin C (ASCORBIC ACID) 500 MG tablet Take 500 mg by mouth daily.   ? ?No facility-administered encounter medications on file as of 03/22/2022.  ? ? ?Allergies (verified) ?Patient has no known allergies.  ? ?History: ?Past Medical History:  ?Diagnosis Date  ? Arthritis LOW BACK AND KNEES  ? Esophageal stricture   ? Hypertension   ? Migraine headache   ? Dr FDomingo Cocking ? Multiple food allergies   ? Nocturia   ? [facility]  ? Gleason 3+3=6, volume 33.84 cc  ? Seasonal allergies   ? on immunotherapy  ? Tubular adenoma of colon 2011  ? ?Past Surgical History:  ?  Procedure Laterality Date  ? BIOPSY PROSTATE  06/14/2012  ? MD OFFICE  ? COLONOSCOPY  05/29/2010  ?  "A sessile polyp was found in the ascending colon.  It was 5 mm in size.   A sessile polyp was found the the distal transverse colon.  It was 3 mm in size.   Moderate diverticulosis was found in the sigmoid colon."   ? COLONOSCOPY  06/17/2020  ? CYSTOSCOPY  10/26/2012  ? Procedure: CYSTOSCOPY FLEXIBLE;  Surgeon: Malka So, MD;  [facility];  Service: Urology;  Laterality: N/A;  ? HEMORRHOID SURGERY  05-19-2006  ? PPH  ? POLYPECTOMY    ?  RADIOACTIVE SEED IMPLANT  10/26/2012  ? Procedure: RADIOACTIVE SEED IMPLANT;  Surgeon: Malka So, MD;  Location: [facility];  Service: Urology;  Laterality: N/A;  ? TONSILLECTOMY  1964  ? 76 years old  ? UPPER GASTROINTESTINAL ENDOSCOPY  06/17/2020  ? WRIST ARTHROSCOPY  03-27-2003  ? DEBRIDEMENT VOLAR ULNAR  & SCAPHOLUNATE LIGAMENT TEARS OF RIGHT WRIST  ? ?Family History  ?Problem Relation Age of Onset  ? Hypertension Father   ? Heart failure Father   ? Colon polyps Father   ? Diabetes Mother   ? Hypertension Mother   ? Hypertension Maternal Grandmother   ? Diabetes Maternal Grandmother   ? Stroke Maternal Grandfather   ?     > 55  ? Bone cancer Paternal Grandfather   ? Colon cancer Neg Hx   ? Esophageal cancer Neg Hx   ? Rectal cancer Neg Hx   ? Stomach cancer Neg Hx   ? ?Social History  ? ?Socioeconomic History  ? Marital status: Married  ?  Spouse name: Not on file  ? Number of children: Not on file  ? Years of education: Not on file  ? Highest education level: Not on file  ?Occupational History  ? Occupation: OWNER  ?  Employer: [employer]  ?  Comment: Manufactures aircraft parts  ?Tobacco Use  ? Smoking status: Never  ? Smokeless tobacco: Never  ?Vaping Use  ? Vaping Use: Never used  ?Substance and Sexual Activity  ? Alcohol use: No  ? Drug use: No  ? Sexual activity: Yes  ?Other Topics Concern  ? Not on file  ?Social History Narrative  ? Married over 40 years  ?   ? 2 daughters  ?   ? 03/20/12 PSA 4.03  ? ?Social Determinants of Health  ? ?Financial Resource Strain: Low Risk   ? Difficulty of Paying Living Expenses: Not hard at all  ?Food Insecurity: No Food Insecurity  ? Worried About Charity fundraiser in the Last Year: Never true  ? Ran Out of Food in the Last Year: Never true  ?Transportation Needs: No Transportation Needs  ? Lack of Transportation (Medical): No  ? Lack of Transportation (Non-Medical): No  ?Physical Activity: Sufficiently Active  ? Days of Exercise per Week: 7 days   ? Minutes of Exercise per Session: 30 min  ?Stress: No Stress Concern Present  ? Feeling of Stress : Not at all  ?Social Connections: Socially Integrated  ? Frequency of Communication with Friends and Family: Twice a week  ? Frequency of Social Gatherings with Friends and Family: Twice a week  ? Attends Religious Services: More than 4 times per year  ? Active Member of Clubs or Organizations: Yes  ? Attends Archivist Meetings: 1 to 4 times per year  ?  Marital Status: Married  ? ? ?Tobacco Counseling ?Counseling given: Not Answered ? ? ?Clinical Intake: ? ?Pre-visit preparation completed: Yes ? ?Pain : No/denies pain ? ?  ? ?Nutritional Risks: None ?Diabetes: No ? ?How often do you need to have someone help you when you read instructions, pamphlets, or other written materials from your doctor or pharmacy?: 1 - Never ?What is the last grade level you completed in school?: college ? ?Diabetic?no  ? ?Interpreter Needed?: No ? ?Information entered by :: [email or web address] ? ? ?Activities of Daily Living ? ?  03/22/2022  ?  8:51 AM  ?In your present state of health, do you have any difficulty performing the following activities:  ?Hearing? 0  ?Vision? 0  ?Difficulty concentrating or making decisions? 0  ?Walking or climbing stairs? 0  ?Dressing or bathing? 0  ?Doing errands, shopping? 0  ?Preparing Food and eating ? N  ?Using the Toilet? N  ?In the past six months, have you accidently leaked urine? N  ?Do you have problems with loss of bowel control? N  ?Managing your Medications? N  ?Managing your Finances? N  ?Housekeeping or managing your Housekeeping? N  ? ? ?Patient Care Team: ?Binnie Rail, MD as PCP - General (Internal Medicine) ?Marica Otter, OD as Consulting Physician (Optometry) ? ?Indicate any recent Medical Services you may have received from other than [facility] providers in the past year (date may be approximate). ? ?   ?Assessment:  ? This is a routine wellness examination for [place]. ? ?Hearing/Vision  screen ?Vision Screening - Comments:: Annual eye exams wears glasses  ? ?Dietary issues and exercise activities discussed: ?  ? ? Goals Addressed   ? ?  ?  ?  ?  ? This Visit's Progress  ?  Patient Stated   On tra

## 2022-03-25 DIAGNOSIS — Z5189 Encounter for other specified aftercare: Secondary | ICD-10-CM | POA: Diagnosis not present

## 2022-04-21 DIAGNOSIS — Z5189 Encounter for other specified aftercare: Secondary | ICD-10-CM | POA: Diagnosis not present

## 2022-04-28 DIAGNOSIS — Z85828 Personal history of other malignant neoplasm of skin: Secondary | ICD-10-CM | POA: Diagnosis not present

## 2022-04-28 DIAGNOSIS — Z1283 Encounter for screening for malignant neoplasm of skin: Secondary | ICD-10-CM | POA: Diagnosis not present

## 2022-04-28 DIAGNOSIS — L821 Other seborrheic keratosis: Secondary | ICD-10-CM | POA: Diagnosis not present

## 2022-04-28 DIAGNOSIS — Z08 Encounter for follow-up examination after completed treatment for malignant neoplasm: Secondary | ICD-10-CM | POA: Diagnosis not present

## 2022-05-03 DIAGNOSIS — Z8546 Personal history of malignant neoplasm of prostate: Secondary | ICD-10-CM | POA: Diagnosis not present

## 2022-05-12 DIAGNOSIS — Z5189 Encounter for other specified aftercare: Secondary | ICD-10-CM | POA: Diagnosis not present

## 2022-06-07 DIAGNOSIS — Z5189 Encounter for other specified aftercare: Secondary | ICD-10-CM | POA: Diagnosis not present

## 2022-07-06 DIAGNOSIS — J3089 Other allergic rhinitis: Secondary | ICD-10-CM | POA: Diagnosis not present

## 2022-07-28 ENCOUNTER — Encounter: Payer: Self-pay | Admitting: Internal Medicine

## 2022-07-29 ENCOUNTER — Telehealth: Payer: Medicare PPO | Admitting: Nurse Practitioner

## 2022-08-05 DIAGNOSIS — Z5189 Encounter for other specified aftercare: Secondary | ICD-10-CM | POA: Diagnosis not present

## 2022-08-09 DIAGNOSIS — Z5189 Encounter for other specified aftercare: Secondary | ICD-10-CM | POA: Diagnosis not present

## 2022-08-13 DIAGNOSIS — Z516 Encounter for desensitization to allergens: Secondary | ICD-10-CM | POA: Diagnosis not present

## 2022-08-17 DIAGNOSIS — Z5189 Encounter for other specified aftercare: Secondary | ICD-10-CM | POA: Diagnosis not present

## 2022-08-19 DIAGNOSIS — Z5189 Encounter for other specified aftercare: Secondary | ICD-10-CM | POA: Diagnosis not present

## 2022-09-12 ENCOUNTER — Other Ambulatory Visit: Payer: Self-pay | Admitting: Internal Medicine

## 2022-09-12 MED ORDER — ATOVAQUONE-PROGUANIL HCL 250-100 MG PO TABS: 1.0000 | ORAL_TABLET | Freq: Every day | ORAL | 0 refills | Status: DC

## 2022-09-12 MED ORDER — AZITHROMYCIN 250 MG PO TABS: ORAL_TABLET | ORAL | 0 refills | Status: DC

## 2022-09-16 DIAGNOSIS — Z5189 Encounter for other specified aftercare: Secondary | ICD-10-CM | POA: Diagnosis not present

## 2022-09-21 ENCOUNTER — Other Ambulatory Visit: Payer: Self-pay | Admitting: Gastroenterology

## 2022-09-21 DIAGNOSIS — K2100 Gastro-esophageal reflux disease with esophagitis, without bleeding: Secondary | ICD-10-CM

## 2022-09-27 ENCOUNTER — Telehealth: Payer: Self-pay | Admitting: Gastroenterology

## 2022-09-27 DIAGNOSIS — K2100 Gastro-esophageal reflux disease with esophagitis, without bleeding: Secondary | ICD-10-CM

## 2022-09-27 MED ORDER — PANTOPRAZOLE SODIUM 40 MG PO TBEC: 40.0000 mg | DELAYED_RELEASE_TABLET | Freq: Two times a day (BID) | ORAL | 0 refills | Status: DC

## 2022-09-27 NOTE — Telephone Encounter (Signed)
Inbound call from patients wife stating that he needed to make an appointment to get a refill for [facility]. Patient was schedule for 11/29 at 9:50. Patients wife is requesting refill to be sent to [facility] in [place].  Please advise.

## 2022-09-27 NOTE — Telephone Encounter (Signed)
Prescription sent to patient's pharmacy until scheduled appt. 

## 2022-09-28 DIAGNOSIS — G43019 Migraine without aura, intractable, without status migrainosus: Secondary | ICD-10-CM | POA: Diagnosis not present

## 2022-10-13 DIAGNOSIS — Z5189 Encounter for other specified aftercare: Secondary | ICD-10-CM | POA: Diagnosis not present

## 2022-10-20 DIAGNOSIS — L820 Inflamed seborrheic keratosis: Secondary | ICD-10-CM | POA: Diagnosis not present

## 2022-10-20 DIAGNOSIS — D489 Neoplasm of uncertain behavior, unspecified: Secondary | ICD-10-CM | POA: Diagnosis not present

## 2022-10-20 DIAGNOSIS — L821 Other seborrheic keratosis: Secondary | ICD-10-CM | POA: Diagnosis not present

## 2022-10-21 ENCOUNTER — Other Ambulatory Visit: Payer: Self-pay | Admitting: Gastroenterology

## 2022-10-21 DIAGNOSIS — K2100 Gastro-esophageal reflux disease with esophagitis, without bleeding: Secondary | ICD-10-CM

## 2022-10-26 DIAGNOSIS — H524 Presbyopia: Secondary | ICD-10-CM | POA: Diagnosis not present

## 2022-10-26 DIAGNOSIS — R519 Headache, unspecified: Secondary | ICD-10-CM | POA: Diagnosis not present

## 2022-10-26 DIAGNOSIS — Z961 Presence of intraocular lens: Secondary | ICD-10-CM | POA: Diagnosis not present

## 2022-10-26 DIAGNOSIS — Z9849 Cataract extraction status, unspecified eye: Secondary | ICD-10-CM | POA: Diagnosis not present

## 2022-10-26 DIAGNOSIS — H5213 Myopia, bilateral: Secondary | ICD-10-CM | POA: Diagnosis not present

## 2022-10-26 DIAGNOSIS — H53143 Visual discomfort, bilateral: Secondary | ICD-10-CM | POA: Diagnosis not present

## 2022-11-04 DIAGNOSIS — Z5189 Encounter for other specified aftercare: Secondary | ICD-10-CM | POA: Diagnosis not present

## 2022-11-08 DIAGNOSIS — N5201 Erectile dysfunction due to arterial insufficiency: Secondary | ICD-10-CM | POA: Diagnosis not present

## 2022-11-08 DIAGNOSIS — Z8546 Personal history of malignant neoplasm of prostate: Secondary | ICD-10-CM | POA: Diagnosis not present

## 2022-11-24 ENCOUNTER — Ambulatory Visit: Payer: Medicare PPO | Admitting: Gastroenterology

## 2022-11-24 ENCOUNTER — Encounter: Payer: Self-pay | Admitting: Gastroenterology

## 2022-11-24 VITALS — BP 144/76 | HR 82 | Ht 68.0 in | Wt 155.0 lb

## 2022-11-24 DIAGNOSIS — K2100 Gastro-esophageal reflux disease with esophagitis, without bleeding: Secondary | ICD-10-CM

## 2022-11-24 DIAGNOSIS — Z86010 Personal history of colonic polyps: Secondary | ICD-10-CM | POA: Diagnosis not present

## 2022-11-24 NOTE — Progress Notes (Signed)
    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 once daily and follow antireflux measures 5-year interval surveillance colonoscopy is recommended in June 2026   HPI    This is a 76 year old male with a history of GERD with LA Grade B esophagitis and a medium- sized hiatal hernia.  His reflux symptoms are well controlled on pantoprazole 40 mg daily. Infrequent reflux in the evenings after dinner. No other GI complaints.    Labs / Imaging       Latest Ref Rng & Units 12/14/2021    8:51 AM 12/11/2020    9:29 AM 09/27/2020    3:05 PM  Hepatic Function  Total Protein 6.0 - 8.3 g/dL 6.9  7.2  7.7   Albumin 3.5 - 5.2 g/dL 4.4  4.5  5.0   AST 0 - 37 U/L _0 ALT 0 - 53 U/L _1 Alk Phosphatase 39 - 117 U/L 88  77  75   Total Bilirubin 0.2 - 1.2 mg/dL 0.8  0.5  1.7        Latest Ref Rng & Units 12/14/2021    8:51 AM 11/03/2021    9:35 AM 09/08/2021    9:35 AM  CBC  WBC 4.0 - 10.5 K/uL 5.7  4.9  5.3   Hemoglobin 13.0 - 17.0 g/dL 15.4  14.5  14.0   Hematocrit 39.0 - 52.0 % 46.3  43.1  42.5   Platelets 150.0 - 400.0 K/uL 195.0  193.0  182.0     Current Medications, Allergies, Past Medical History, Past Surgical History, Family History and Social History were reviewed in [employer] record.   Physical Exam: General: Well developed, well nourished, no acute distress Head: Normocephalic and atraumatic Eyes: Sclerae anicteric, EOMI Ears: Normal auditory acuity Mouth: Not examined Lungs: Clear throughout to auscultation Heart: Regular rate and rhythm; no murmurs, rubs or bruits Abdomen: Soft, non tender and non distended. No masses, hepatosplenomegaly or hernias noted. Normal Bowel sounds Rectal: Not done Musculoskeletal: Symmetrical  with no gross deformities  Pulses:  Normal pulses noted Extremities: No clubbing, cyanosis, edema or deformities noted Neurological: Alert oriented x 4, grossly nonfocal Psychological:  Alert and cooperative. Normal mood and affect   Mylin Gignac T. Fuller Plan, MD 11/24/2022, 9:50 AM

## 2022-11-24 NOTE — Patient Instructions (Signed)
Please have your pharmacy contact us when you need refills of pantoprazole.   The [facility] providers would like to encourage you to use [facility] to communicate with providers for non-urgent requests or questions.  Due to long hold times on the telephone, sending your provider a message by [facility] may be a faster and more efficient way to get a response.  Please allow 48 business hours for a response.  Please remember that this is for non-urgent requests.   Thank you for choosing me and [facility].  Pricilla Riffle. Dagoberto Ligas., MD., Marval Regal

## 2022-12-02 DIAGNOSIS — Z516 Encounter for desensitization to allergens: Secondary | ICD-10-CM | POA: Diagnosis not present

## 2022-12-12 NOTE — Progress Notes (Unsigned)
Subjective:    Patient ID: Brian [name], male    DOB: 04-20-1946, 76 y.o.   MRN: [id number]     HPI Brian [name] is here for a physical exam.   No changes in health. No concerns.    Medications and allergies reviewed with patient and updated if appropriate.  Current Outpatient Medications on File Prior to Visit  Medication Sig Dispense Refill   EPIPEN 2-PAK 0.3 MG/0.3ML SOAJ injection Inject 1 mg into the muscle as needed.     Multiple Vitamins-Minerals (MACUVITE PO) Take 1 tablet by mouth daily.     pantoprazole (PROTONIX) 40 MG tablet TAKE 1 TABLET (40 MG TOTAL) BY MOUTH TWICE A DAY BEFORE MEALS (Patient taking differently: Take 40 mg by mouth daily.) 180 tablet 0   PRESCRIPTION MEDICATION Inject into the muscle every 30 (thirty) days. ALLERGY INJECTION'S MONTHLY     SUMAtriptan (IMITREX) 100 MG tablet Take 25 mg by mouth every 2 (two) hours as needed.      vitamin C (ASCORBIC ACID) 500 MG tablet Take 500 mg by mouth daily.      No current facility-administered medications on file prior to visit.    Review of Systems  Constitutional:  Negative for chills and fever.  HENT:  Negative for hearing loss.   Eyes:  Negative for visual disturbance.  Respiratory:  Negative for cough, shortness of breath and wheezing.   Cardiovascular:  Negative for chest pain, palpitations and leg swelling.  Gastrointestinal:  Negative for abdominal pain, blood in stool, constipation, diarrhea and nausea.       Gerd controlled  Genitourinary:  Negative for dysuria and hematuria.  Musculoskeletal:  Positive for arthralgias (left knee at times) and back pain (occ lower back).  Skin:  Negative for rash.  Neurological:  Positive for headaches (occ). Negative for weakness, light-headedness and numbness.  Psychiatric/Behavioral:  Negative for dysphoric mood. The patient is not nervous/anxious.        Objective:   Vitals:   12/15/22 0815  BP: 128/74  Pulse: 60  Temp: 97.7 F (36.5 C)  SpO2:  97%   Filed Weights   12/15/22 0815  Weight: 155 lb 3.2 oz (70.4 kg)   Body mass index is 23.6 kg/m.  BP Readings from Last 3 Encounters:  12/15/22 128/74  11/24/22 (!) 144/76  12/14/21 134/82    Wt Readings from Last 3 Encounters:  12/15/22 155 lb 3.2 oz (70.4 kg)  11/24/22 155 lb (70.3 kg)  12/14/21 154 lb 3.2 oz (69.9 kg)      Physical Exam Constitutional: He appears well-developed and well-nourished. No distress.  HENT:  Head: Normocephalic and atraumatic.  Right Ear: External ear normal.  Left Ear: External ear normal.  Mouth/Throat: Oropharynx is clear and moist.  Normal ear canals and TM b/l  Eyes: Conjunctivae and EOM are normal.  Neck: Neck supple. No tracheal deviation present. No thyromegaly present.  No carotid bruit  Cardiovascular: Normal rate, regular rhythm, normal heart sounds and intact distal pulses.   No murmur heard. Pulmonary/Chest: Effort normal and breath sounds normal. No respiratory distress. He has no wheezes. He has no rales.  Abdominal: Soft. He exhibits no distension. There is no tenderness.  Genitourinary: deferred  Musculoskeletal: He exhibits no edema.  Lymphadenopathy:   He has no cervical adenopathy.  Skin: Skin is warm and dry. He is not diaphoretic.  Psychiatric: He has a normal mood and affect. His behavior is normal.  Assessment & Plan:   Physical exam: Screening blood work  ordered Exercise  regular  - daily    Weight  normal  Substance abuse   none   Reviewed recommended immunizations.   Health Maintenance  Topic Date Due   COVID-19 Vaccine (5 - 2023-24 season) 01/04/2023   Medicare Annual Wellness (AWV)  03/23/2023   COLONOSCOPY (Pts 45-88yr Insurance coverage will need to be confirmed)  06/17/2025   DTaP/Tdap/Td (3 - Td or Tdap) 09/07/2028   Pneumonia Vaccine 74 Years old  Completed   INFLUENZA VACCINE  Completed   Hepatitis C Screening  Completed   Zoster Vaccines- Shingrix  Completed   HPV  VACCINES  Aged Out     See Problem List for Assessment and Plan of chronic medical problems.

## 2022-12-12 NOTE — Patient Instructions (Addendum)
Blood work was ordered.   The lab is on the first floor.    Medications changes include : None      Return in about 1 year (around 12/16/2023) for Physical Exam.    Health Maintenance, Male Adopting a healthy lifestyle and getting preventive care are important in promoting health and wellness. Ask your health care provider about: The right schedule for you to have regular tests and exams. Things you can do on your own to prevent diseases and keep yourself healthy. What should I know about diet, weight, and exercise? Eat a healthy diet  Eat a diet that includes plenty of vegetables, fruits, low-fat dairy products, and lean protein. Do not eat a lot of foods that are high in solid fats, added sugars, or sodium. Maintain a healthy weight Body mass index (BMI) is a measurement that can be used to identify possible weight problems. It estimates body fat based on height and weight. Your health care provider can help determine your BMI and help you achieve or maintain a healthy weight. Get regular exercise Get regular exercise. This is one of the most important things you can do for your health. Most adults should: Exercise for at least 150 minutes each week. The exercise should increase your heart rate and make you sweat (moderate-intensity exercise). Do strengthening exercises at least twice a week. This is in addition to the moderate-intensity exercise. Spend less time sitting. Even light physical activity can be beneficial. Watch cholesterol and blood lipids Have your blood tested for lipids and cholesterol at 74 years of age, then have this test every 5 years. You may need to have your cholesterol levels checked more often if: Your lipid or cholesterol levels are high. You are older than 74 years of age. You are at high risk for heart disease. What should I know about cancer screening? Many types of cancers can be detected early and may often be prevented. Depending on  your health history and family history, you may need to have cancer screening at various ages. This may include screening for: Colorectal cancer. Prostate cancer. Skin cancer. Lung cancer. What should I know about heart disease, diabetes, and high blood pressure? Blood pressure and heart disease High blood pressure causes heart disease and increases the risk of stroke. This is more likely to develop in people who have high blood pressure readings or are overweight. Talk with your health care provider about your target blood pressure readings. Have your blood pressure checked: Every 3-5 years if you are 80-36 years of age. Every year if you are 55 years old or older. If you are between the ages of 30 and 68 and are a current or former smoker, ask your health care provider if you should have a one-time screening for abdominal aortic aneurysm (AAA). Diabetes Have regular diabetes screenings. This checks your fasting blood sugar level. Have the screening done: Once every three years after age 56 if you are at a normal weight and have a low risk for diabetes. More often and at a younger age if you are overweight or have a high risk for diabetes. What should I know about preventing infection? Hepatitis B If you have a higher risk for hepatitis B, you should be screened for this virus. Talk with your health care provider to find out if you are at risk for hepatitis B infection. Hepatitis C Blood testing is recommended for: Everyone born from [birth year] through 1963. Anyone with known risk  factors for hepatitis C. Sexually transmitted infections (STIs) You should be screened each year for STIs, including gonorrhea and chlamydia, if: You are sexually active and are younger than 74 years of age. You are older than 74 years of age and your health care provider tells you that you are at risk for this type of infection. Your sexual activity has changed since you were last screened, and you are at increased  risk for chlamydia or gonorrhea. Ask your health care provider if you are at risk. Ask your health care provider about whether you are at high risk for HIV. Your health care provider may recommend a prescription medicine to help prevent HIV infection. If you choose to take medicine to prevent HIV, you should first get tested for HIV. You should then be tested every 3 months for as long as you are taking the medicine. Follow these instructions at home: Alcohol use Do not drink alcohol if your health care provider tells you not to drink. If you drink alcohol: Limit how much you have to 0-2 drinks a day. Know how much alcohol is in your drink. In the [place] one drink equals one 12 oz bottle of beer (355 mL), one 5 oz glass of wine (148 mL), or one 1 oz glass of hard liquor (44 mL). Lifestyle Do not use any products that contain nicotine or tobacco. These products include cigarettes, chewing tobacco, and vaping devices, such as e-cigarettes. If you need help quitting, ask your health care provider. Do not use street drugs. Do not share needles. Ask your health care provider for help if you need support or information about quitting drugs. General instructions Schedule regular health, dental, and eye exams. Stay current with your vaccines. Tell your health care provider if: You often feel depressed. You have ever been abused or do not feel safe at home. Summary Adopting a healthy lifestyle and getting preventive care are important in promoting health and wellness. Follow your health care provider's instructions about healthy diet, exercising, and getting tested or screened for diseases. Follow your health care provider's instructions on monitoring your cholesterol and blood pressure. This information is not intended to replace advice given to you by your health care provider. Make sure you discuss any questions you have with your health care provider. Document Revised: 05/04/2021 Document  Reviewed: 05/04/2021 Elsevier Patient Education  [place].

## 2022-12-15 ENCOUNTER — Encounter: Payer: Self-pay | Admitting: Internal Medicine

## 2022-12-15 ENCOUNTER — Ambulatory Visit (INDEPENDENT_AMBULATORY_CARE_PROVIDER_SITE_OTHER): Payer: Medicare PPO | Admitting: Internal Medicine

## 2022-12-15 VITALS — BP 128/74 | HR 60 | Temp 97.7°F | Ht 68.0 in | Wt 155.2 lb

## 2022-12-15 DIAGNOSIS — K2100 Gastro-esophageal reflux disease with esophagitis, without bleeding: Secondary | ICD-10-CM | POA: Diagnosis not present

## 2022-12-15 DIAGNOSIS — G43809 Other migraine, not intractable, without status migrainosus: Secondary | ICD-10-CM

## 2022-12-15 DIAGNOSIS — Z8546 Personal history of malignant neoplasm of prostate: Secondary | ICD-10-CM

## 2022-12-15 DIAGNOSIS — I700 Atherosclerosis of aorta: Secondary | ICD-10-CM | POA: Diagnosis not present

## 2022-12-15 DIAGNOSIS — Z0000 Encounter for general adult medical examination without abnormal findings: Secondary | ICD-10-CM

## 2022-12-15 LAB — CBC WITH DIFFERENTIAL/PLATELET
Basophils Absolute: 0.1 10*3/uL (ref 0.0–0.1)
Basophils Relative: 1.1 % (ref 0.0–3.0)
Eosinophils Absolute: 0.2 10*3/uL (ref 0.0–0.7)
Eosinophils Relative: 2.9 % (ref 0.0–5.0)
HCT: 44 % (ref 39.0–52.0)
Hemoglobin: 15.1 g/dL (ref 13.0–17.0)
Lymphocytes Relative: 25 % (ref 12.0–46.0)
Lymphs Abs: 1.6 10*3/uL (ref 0.7–4.0)
MCHC: 34.2 g/dL (ref 30.0–36.0)
MCV: 88 fl (ref 78.0–100.0)
Monocytes Absolute: 0.6 10*3/uL (ref 0.1–1.0)
Monocytes Relative: 8.8 % (ref 3.0–12.0)
Neutro Abs: 3.9 10*3/uL (ref 1.4–7.7)
Neutrophils Relative %: 62.2 % (ref 43.0–77.0)
Platelets: 225 10*3/uL (ref 150.0–400.0)
RBC: 5 Mil/uL (ref 4.22–5.81)
RDW: 13.5 % (ref 11.5–15.5)
WBC: 6.3 10*3/uL (ref 4.0–10.5)

## 2022-12-15 LAB — COMPREHENSIVE METABOLIC PANEL
ALT: 15 U/L (ref 0–53)
AST: 18 U/L (ref 0–37)
Albumin: 4.5 g/dL (ref 3.5–5.2)
Alkaline Phosphatase: 85 U/L (ref 39–117)
BUN: 17 mg/dL (ref 6–23)
CO2: 28 mEq/L (ref 19–32)
Calcium: 9.6 mg/dL (ref 8.4–10.5)
Chloride: 106 mEq/L (ref 96–112)
Creatinine, Ser: 1.01 mg/dL (ref 0.40–1.50)
GFR: 73.31 mL/min (ref >60.00)
Glucose, Bld: 94 mg/dL (ref 70–99)
Potassium: 4.7 mEq/L (ref 3.5–5.1)
Sodium: 140 mEq/L (ref 135–145)
Total Bilirubin: 0.8 mg/dL (ref 0.2–1.2)
Total Protein: 7.1 g/dL (ref 6.0–8.3)

## 2022-12-15 LAB — LIPID PANEL
Cholesterol: 163 mg/dL (ref 0–200)
HDL: 36.5 mg/dL — ABNORMAL LOW (ref >39.00)
LDL Cholesterol: 104 mg/dL — ABNORMAL HIGH (ref 0–99)
NonHDL: 126.78
Total CHOL/HDL Ratio: 4
Triglycerides: 112 mg/dL (ref 0.0–149.0)
VLDL: 22.4 mg/dL (ref 0.0–40.0)

## 2022-12-15 LAB — TSH: TSH: 3.77 u[IU]/mL (ref 0.35–5.50)

## 2022-12-15 NOTE — Assessment & Plan Note (Signed)
Chronic Sees Dr. Avis Epley Imitrex as needed

## 2022-12-15 NOTE — Assessment & Plan Note (Signed)
Chronic GERD controlled Continue pantoprazole 40 mg daily 

## 2022-12-15 NOTE — Assessment & Plan Note (Signed)
Chronic Coronary calcium score 17 in 2021 Currently not on medication Continue regular exercise, healthy diet Lipids today

## 2022-12-15 NOTE — Assessment & Plan Note (Signed)
History of prostate cancer Following with urology No evidence of recurrence

## 2022-12-29 DIAGNOSIS — J3089 Other allergic rhinitis: Secondary | ICD-10-CM | POA: Diagnosis not present

## 2022-12-29 DIAGNOSIS — J301 Allergic rhinitis due to pollen: Secondary | ICD-10-CM | POA: Diagnosis not present

## 2022-12-29 DIAGNOSIS — R519 Headache, unspecified: Secondary | ICD-10-CM | POA: Diagnosis not present

## 2022-12-30 DIAGNOSIS — Z5189 Encounter for other specified aftercare: Secondary | ICD-10-CM | POA: Diagnosis not present

## 2023-01-22 ENCOUNTER — Other Ambulatory Visit: Payer: Self-pay | Admitting: Gastroenterology

## 2023-01-22 DIAGNOSIS — K2100 Gastro-esophageal reflux disease with esophagitis, without bleeding: Secondary | ICD-10-CM

## 2023-02-01 DIAGNOSIS — E291 Testicular hypofunction: Secondary | ICD-10-CM | POA: Diagnosis not present

## 2023-02-22 ENCOUNTER — Other Ambulatory Visit: Payer: Self-pay | Admitting: Gastroenterology

## 2023-02-22 DIAGNOSIS — K2100 Gastro-esophageal reflux disease with esophagitis, without bleeding: Secondary | ICD-10-CM

## 2023-03-03 DIAGNOSIS — Z5189 Encounter for other specified aftercare: Secondary | ICD-10-CM | POA: Diagnosis not present

## 2023-03-24 ENCOUNTER — Ambulatory Visit (INDEPENDENT_AMBULATORY_CARE_PROVIDER_SITE_OTHER): Payer: Medicare PPO

## 2023-03-24 VITALS — Ht 68.0 in | Wt 155.0 lb

## 2023-03-24 DIAGNOSIS — Z0000 Encounter for general adult medical examination without abnormal findings: Secondary | ICD-10-CM | POA: Diagnosis not present

## 2023-03-24 NOTE — Progress Notes (Signed)
Subjective:   Brian Brian is a 77 y.o. male who presents for Medicare Annual/Subsequent preventive examination.  Review of Systems     Cardiac Risk Factors include: advanced age (>13men, >24 women);male gender     Objective:    Today's Vitals   03/24/23 0958  Weight: 155 lb (70.3 kg)  Height: 5\' 8"  (1.727 m)   Body mass index is 23.57 kg/m.     03/24/2023   10:51 AM 03/22/2022    8:49 AM 03/11/2021    1:28 PM 09/27/2020    3:05 PM 06/10/2015   10:28 AM 10/26/2012    6:59 AM  Advanced Directives  Does Patient Have a Medical Advance Directive? Yes Yes Yes Yes Yes Patient has advance directive, copy not in chart  Type of Advance Directive Living will;Healthcare Power of Brian Brian;Living will Living will;Healthcare Power of Brian Brian;Living will Brian Brian;Living will  Does patient want to make changes to medical advance directive? No - Patient declined  No - Patient declined     Copy of Brian Brian in Chart? Yes - validated most recent copy scanned in chart (See row information) No - copy requested No - copy requested     Pre-existing out of facility DNR order (yellow form or pink MOST form)      No    Current Medications (verified) Outpatient Encounter Medications as of 03/24/2023  Medication Sig   EPIPEN 2-PAK 0.3 MG/0.3ML SOAJ injection Inject 1 mg into the muscle as needed.   Multiple Vitamins-Minerals (MACUVITE PO) Take 1 tablet by mouth daily.   pantoprazole (PROTONIX) 40 MG tablet TAKE 1 TABLET BY MOUTH EVERY DAY   PRESCRIPTION MEDICATION Inject into the muscle every 30 (thirty) days. ALLERGY INJECTION'S MONTHLY   SUMAtriptan (IMITREX) 100 MG tablet Take 25 mg by mouth every 2 (two) hours as needed.    vitamin C (ASCORBIC ACID) 500 MG tablet Take 500 mg by mouth daily.    No facility-administered encounter medications on file as of 03/24/2023.    Allergies  (verified) Patient has no known allergies.   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  It was 5 mm in size.   A sessile polyp was found the the distal transverse colon.  It was 3 mm in size.   Moderate diverticulosis was found in the sigmoid colon."    COLONOSCOPY  06/17/2020   CYSTOSCOPY  10/26/2012   Procedure: CYSTOSCOPY FLEXIBLE;  Surgeon: Brian So, MD;  Location: [facility];  Service: Urology;  Laterality: N/A;   HEMORRHOID SURGERY  05-19-2006   [place]   POLYPECTOMY     RADIOACTIVE SEED IMPLANT  10/26/2012   Procedure: RADIOACTIVE SEED IMPLANT;  Surgeon: Brian So, MD;  Location: [facility];  Service: Urology;  Laterality: N/A;   TONSILLECTOMY  1964   77 years  old   UPPER GASTROINTESTINAL ENDOSCOPY  06/17/2020   WRIST ARTHROSCOPY  03-27-2003   DEBRIDEMENT VOLAR ULNAR  & SCAPHOLUNATE LIGAMENT TEARS OF RIGHT WRIST   Family History  Problem Relation Age of Onset   Hypertension Father    Heart failure Father    Colon polyps Father    Diabetes Mother    Hypertension Mother    Hypertension Maternal Grandmother    Diabetes Maternal Grandmother    Stroke Maternal Grandfather        > 54   Bone cancer Paternal Grandfather    Colon cancer Neg Hx    Esophageal cancer Neg Hx    Rectal cancer Neg Hx    Stomach cancer Neg Hx    Social History   Socioeconomic History   Marital status: Married    Spouse name: Not on file   Number of children: Not on file   Years of education:  Not on file   Highest education level: Not on file  Occupational History   Occupation: OWNER    Employer: Brian Brian    Comment: Primary school teacher parts  Tobacco Use   Smoking status: Never   Smokeless tobacco: Never  Vaping Use   Vaping Use: Never used  Substance and Sexual Activity   Alcohol use: No   Drug use: No   Sexual activity: Yes  Other Topics Concern   Not on file  Social History Narrative   Married over 16 years      2 daughters      03/20/12 PSA 4.03   Social Determinants of Health   Financial Resource Strain: Low Risk  (03/24/2023)   Overall Financial Resource Strain (CARDIA)    Difficulty of Paying Living Expenses: Not hard at all  Food Insecurity: No Food Insecurity (03/24/2023)   Hunger Vital Sign    Worried About Running Out of Food in the Last Year: Never true    Ran Out of Food in the Last Year: Never true  Transportation Needs: No Transportation Needs (03/24/2023)   PRAPARE - Hydrologist (Medical): No    Lack of Transportation (Non-Medical): No  Physical Activity: Sufficiently Active (03/24/2023)   Exercise Vital Sign    Days of Exercise per Week: 7 days    Minutes of Exercise per Session: 30 min  Stress: No Stress Concern Present (03/24/2023)   [place]    Feeling of Stress : Not at all  Social Connections: [place] (03/24/2023)   Social Connection and Isolation Panel [NHANES]    Frequency of Communication with Friends and Family: More than three times a week    Frequency of Social Gatherings with Friends and Family: More than three times a week    Attends Religious Services: More than 4 times per year    Active Member of [employer] or Organizations: Yes    Attends Music therapist: More than 4 times per year    Marital Status: Married    Tobacco Counseling Counseling given: Not Answered   Clinical Intake:  Pre-visit  preparation completed: Yes  Pain : No/denies pain  Diabetes: No  How often do you need to have someone help you when you read instructions, pamphlets, or other written materials from your doctor or pharmacy?: 1 - Never  Diabetic?No   Interpreter Needed?: No  Information entered by :: Brian George LPN   Activities of Daily Living    03/24/2023   10:51  AM 03/22/2023    9:18 AM  In your present state of health, do you have any difficulty performing the following activities:  Hearing? 0 0  Vision? 0 0  Difficulty concentrating or making decisions? 0 0  Walking or climbing stairs? 0 0  Dressing or bathing? 0 0  Doing errands, shopping? 0 0  Preparing Food and eating ? N N  Using the Toilet? N N  In the past six months, have you accidently leaked urine? N N  Do you have problems with loss of bowel control? N N  Managing your Medications? N N  Managing your Finances? N N  Housekeeping or managing your Housekeeping? N N    Patient Care Team: Binnie Rail, MD as PCP - General (Internal Medicine) Marica Otter, [place] as Consulting Physician (Optometry) Harold Hedge, Darrick Grinder, MD as Consulting Physician (Allergy and Immunology) Francene Boyers, MD as Referring Physician (Neurology) Allyn Kenner, MD (Dermatology)  Indicate any recent Medical Services you may have received from other than [facility] providers in the past year (date may be approximate).     Assessment:   This is a routine wellness examination for [place].  Hearing/Vision screen Hearing Screening - Comments:: Denies hearing difficulties Vision Screening - Comments:: Wears rx glasses - up to date with routine eye exams with [facility]    Dietary issues and exercise activities discussed: Current Exercise Habits: Home exercise routine, Type of exercise: walking;stretching;strength training/weights, Time (Minutes): 30, Frequency (Times/Week): 7, Weekly Exercise (Minutes/Week): 210, Intensity: Moderate   Goals Addressed              This Visit's Progress    Patient Stated   On track    To maintain my current health status by continuing to eat healthy, stay physically active and socially active.      Depression Screen    03/24/2023   10:50 AM 12/15/2022    8:19 AM 03/22/2022    8:49 AM 03/22/2022    8:47 AM 03/11/2021    1:27 PM 12/11/2020    8:06 AM 12/10/2019    8:03 AM  PHQ 2/9 Scores  PHQ - 2 Score 0 0 0 0 0 0 0  PHQ- 9 Score  0         Fall Risk    03/24/2023   10:49 AM 03/22/2023    9:18 AM 12/15/2022    8:19 AM 03/22/2022    8:49 AM 03/11/2021    1:28 PM  Fall Risk   Falls in the past year? 0 0 0 0 0  Number falls in past yr: 0 0 0 0 0  Injury with Fall? 0  0 0 0  Risk for fall due to : No Fall Risks  No Fall Risks  No Fall Risks  Follow up Falls prevention discussed;Education provided;Falls evaluation completed  Falls evaluation completed Falls evaluation completed Falls evaluation completed    FALL RISK PREVENTION PERTAINING TO THE HOME:  Any stairs in or around the home? Yes  If [name], are there any without handrails? No  Home free of loose throw rugs in walkways, pet beds, electrical cords, etc? Yes  Adequate lighting in your home to reduce risk of falls? Yes   ASSISTIVE DEVICES UTILIZED TO PREVENT FALLS:  Life alert? No  Use of a cane, walker or w/c? No  Grab bars in the bathroom? Yes  Shower chair or bench in shower? No  Elevated toilet seat or a handicapped toilet? Yes   TIMED  UP AND GO:  Was the test performed? No . Telephonic visit   Cognitive Function:        03/24/2023   10:51 AM  6CIT Screen  What Year? 0 points  What month? 0 points  What time? 0 points  Count back from 20 0 points  Months in reverse 0 points  Repeat phrase 0 points  Total Score 0 points    Immunizations Immunization History  Administered Date(s) Administered   Fluad Quad(high Dose 65+) 08/24/2019, 09/03/2022   Influenza Whole 10/27/2012, 09/26/2013   Influenza, High Dose  Seasonal PF 09/27/2017, 09/07/2018, 10/02/2018, 10/03/2019, 10/02/2020   Influenza-Unspecified 09/26/2014, 09/27/2015, 09/26/2016, 10/13/2021   PFIZER(Purple Top)SARS-COV-2 Vaccination 02/01/2020, 02/26/2020, 10/02/2020   Pfizer Covid-19 Vaccine Bivalent Booster 66yrs & up 11/09/2022   Pneumococcal Conjugate-13 01/28/2015   Pneumococcal Polysaccharide-23 12/14/2013, 09/26/2017, 10/02/2018, 10/03/2019, 10/02/2020   Tdap 10/01/2011, 09/07/2018   Zoster Recombinat (Shingrix) 01/23/2019, 07/03/2019   Zoster, Live 05/04/2011    TDAP status: Up to date  Flu Vaccine status: Up to date  Pneumococcal vaccine status: Up to date  Covid-19 vaccine status: Information provided on how to obtain vaccines.   Qualifies for Shingles Vaccine? Yes   Zostavax completed Yes   Shingrix Completed?: Yes  Screening Tests Health Maintenance  Topic Date Due   COVID-19 Vaccine (5 - 2023-24 season) 01/04/2023   Medicare Annual Wellness (AWV)  03/23/2024   COLONOSCOPY (Pts 45-61yrs Insurance coverage will need to be confirmed)  06/17/2025   DTaP/Tdap/Td (3 - Td or Tdap) 09/07/2028   Pneumonia Vaccine 4+ Years old  Completed   INFLUENZA VACCINE  Completed   Hepatitis C Screening  Completed   Zoster Vaccines- Shingrix  Completed   HPV VACCINES  Aged Out    Health Maintenance  Health Maintenance Due  Topic Date Due   COVID-19 Vaccine (5 - 2023-24 season) 01/04/2023    Colorectal cancer screening: Type of screening: Colonoscopy. Completed 06/17/20. Repeat every 5 years  Lung Cancer Screening: (Low Dose CT Chest recommended if Age 68-80 years, 30 pack-year currently smoking OR have quit w/in 15years.) does not qualify.   Lung Cancer Screening Referral: n/a  Additional Screening:  Hepatitis C Screening: does qualify; Completed 11/19/15  Vision Screening: Recommended annual ophthalmology exams for early detection of glaucoma and other disorders of the eye. Is the patient up to date with their annual  eye exam?  Yes  Who is the provider or what is the name of the office in which the patient attends annual eye exams? [facility] If pt is not established with a provider, would they like to be referred to a provider to establish care? No .   Dental Screening: Recommended annual dental exams for proper oral hygiene  Community Resource Referral / Chronic Care Management: CRR required this visit?  No   CCM required this visit?  No      Plan:     I have personally reviewed and noted the following in the patient's chart:   Medical and social history Use of alcohol, tobacco or illicit drugs  Current medications and supplements including opioid prescriptions. Patient is not currently taking opioid prescriptions. Functional ability and status Nutritional status Physical activity Advanced directives List of other physicians Hospitalizations, surgeries, and ER visits in previous 12 months Vitals Screenings to include cognitive, depression, and falls Referrals and appointments  In addition, I have reviewed and discussed with patient certain preventive protocols, quality metrics, and best practice recommendations. A written personalized care plan for  preventive services as well as general preventive health recommendations were provided to patient.     Vanetta Mulders, [place]   [id number]   Due to this being a virtual visit, the after visit summary with patients personalized plan was offered to patient via mail or my-chart.  Patient would like to access on my-chart  Nurse Notes: No concerns

## 2023-03-24 NOTE — Patient Instructions (Signed)
Brian [name] , Thank you for taking time to come for your Medicare Wellness Visit. I appreciate your ongoing commitment to your health goals. Please review the following plan we discussed and let me know if I can assist you in the future.   These are the goals we discussed:  Goals      Patient Stated     To maintain my current health status by continuing to eat healthy, stay physically active and socially active.        This is a list of the screening recommended for you and due dates:  Health Maintenance  Topic Date Due   COVID-19 Vaccine (5 - 2023-24 season) 01/04/2023   Medicare Annual Wellness Visit  03/23/2024   Colon Cancer Screening  06/17/2025   DTaP/Tdap/Td vaccine (3 - Td or Tdap) 09/07/2028   Pneumonia Vaccine  Completed   Flu Shot  Completed   Hepatitis C Screening: USPSTF Recommendation to screen - Ages 18-79 yo.  Completed   Zoster (Shingles) Vaccine  Completed   HPV Vaccine  Aged Out    Advanced directives: We have a copy of your advanced directives available in your record should your provider ever need to access them.   Conditions/risks identified: Aim for 30 minutes of exercise or brisk walking, 6-8 glasses of water, and 5 servings of fruits and vegetables each day.   Next appointment: Follow up in one year for your annual wellness visit.   Preventive Care 54 Years and Older, Male  Preventive care refers to lifestyle choices and visits with your health care provider that can promote health and wellness. What does preventive care include? A yearly physical exam. This is also called an annual well check. Dental exams once or twice a year. Routine eye exams. Ask your health care provider how often you should have your eyes checked. Personal lifestyle choices, including: Daily care of your teeth and gums. Regular physical activity. Eating a healthy diet. Avoiding tobacco and drug use. Limiting alcohol use. Practicing safe sex. Taking low doses of aspirin  every day. Taking vitamin and mineral supplements as recommended by your health care provider. What happens during an annual well check? The services and screenings done by your health care provider during your annual well check will depend on your age, overall health, lifestyle risk factors, and family history of disease. Counseling  Your health care provider may ask you questions about your: Alcohol use. Tobacco use. Drug use. Emotional well-being. Home and relationship well-being. Sexual activity. Eating habits. History of falls. Memory and ability to understand (cognition). Work and work Statistician. Screening  You may have the following tests or measurements: Height, weight, and BMI. Blood pressure. Lipid and cholesterol levels. These may be checked every 5 years, or more frequently if you are over 57 years old. Skin check. Lung cancer screening. You may have this screening every year starting at age 88 if you have a 30-pack-year history of smoking and currently smoke or have quit within the past 15 years. Fecal occult blood test (FOBT) of the stool. You may have this test every year starting at age 85. Flexible sigmoidoscopy or colonoscopy. You may have a sigmoidoscopy every 5 years or a colonoscopy every 10 years starting at age 67. Prostate cancer screening. Recommendations will vary depending on your family history and other risks. Hepatitis C blood test. Hepatitis B blood test. Sexually transmitted disease (STD) testing. Diabetes screening. This is done by checking your blood sugar (glucose) after you have not eaten  for a while (fasting). You may have this done every 1-3 years. Abdominal aortic aneurysm (AAA) screening. You may need this if you are a current or former smoker. Osteoporosis. You may be screened starting at age 66 if you are at high risk. Talk with your health care provider about your test results, treatment options, and if necessary, the need for more  tests. Vaccines  Your health care provider may recommend certain vaccines, such as: Influenza vaccine. This is recommended every year. Tetanus, diphtheria, and acellular pertussis (Tdap, Td) vaccine. You may need a Td booster every 10 years. Zoster vaccine. You may need this after age 66. Pneumococcal 13-valent conjugate (PCV13) vaccine. One dose is recommended after age 33. Pneumococcal polysaccharide (PPSV23) vaccine. One dose is recommended after age 58. Talk to your health care provider about which screenings and vaccines you need and how often you need them. This information is not intended to replace advice given to you by your health care provider. Make sure you discuss any questions you have with your health care provider. Document Released: 01/09/2016 Document Revised: 09/01/2016 Document Reviewed: 10/14/2015 Elsevier Interactive Patient Education  2017 [place] Prevention in the Home Falls can cause injuries. They can happen to people of all ages. There are many things you can do to make your home safe and to help prevent falls. What can I do on the outside of my home? Regularly fix the edges of walkways and driveways and fix any cracks. Remove anything that might make you trip as you walk through a door, such as a raised step or threshold. Trim any bushes or trees on the path to your home. Use bright outdoor lighting. Clear any walking paths of anything that might make someone trip, such as rocks or tools. Regularly check to see if handrails are loose or broken. Make sure that both sides of any steps have handrails. Any raised decks and porches should have guardrails on the edges. Have any leaves, snow, or ice cleared regularly. Use sand or salt on walking paths during winter. Clean up any spills in your garage right away. This includes oil or grease spills. What can I do in the bathroom? Use night lights. Install grab bars by the toilet and in the tub and shower.  Do not use towel bars as grab bars. Use non-skid mats or decals in the tub or shower. If you need to sit down in the shower, use a plastic, non-slip stool. Keep the floor dry. Clean up any water that spills on the floor as soon as it happens. Remove soap buildup in the tub or shower regularly. Attach bath mats securely with double-sided non-slip rug tape. Do not have throw rugs and other things on the floor that can make you trip. What can I do in the bedroom? Use night lights. Make sure that you have a light by your bed that is easy to reach. Do not use any sheets or blankets that are too big for your bed. They should not hang down onto the floor. Have a firm chair that has side arms. You can use this for support while you get dressed. Do not have throw rugs and other things on the floor that can make you trip. What can I do in the kitchen? Clean up any spills right away. Avoid walking on wet floors. Keep items that you use a lot in easy-to-reach places. If you need to reach something above you, use a strong step stool that has  a grab bar. Keep electrical cords out of the way. Do not use floor polish or wax that makes floors slippery. If you must use wax, use non-skid floor wax. Do not have throw rugs and other things on the floor that can make you trip. What can I do with my stairs? Do not leave any items on the stairs. Make sure that there are handrails on both sides of the stairs and use them. Fix handrails that are broken or loose. Make sure that handrails are as long as the stairways. Check any carpeting to make sure that it is firmly attached to the stairs. Fix any carpet that is loose or worn. Avoid having throw rugs at the top or bottom of the stairs. If you do have throw rugs, attach them to the floor with carpet tape. Make sure that you have a light switch at the top of the stairs and the bottom of the stairs. If you do not have them, ask someone to add them for you. What else  can I do to help prevent falls? Wear shoes that: Do not have high heels. Have rubber bottoms. Are comfortable and fit you well. Are closed at the toe. Do not wear sandals. If you use a stepladder: Make sure that it is fully opened. Do not climb a closed stepladder. Make sure that both sides of the stepladder are locked into place. Ask someone to hold it for you, if possible. Clearly mark and make sure that you can see: Any grab bars or handrails. First and last steps. Where the edge of each step is. Use tools that help you move around (mobility aids) if they are needed. These include: Canes. Walkers. Scooters. Crutches. Turn on the lights when you go into a dark area. Replace any light bulbs as soon as they burn out. Set up your furniture so you have a clear path. Avoid moving your furniture around. If any of your floors are uneven, fix them. If there are any pets around you, be aware of where they are. Review your medicines with your doctor. Some medicines can make you feel dizzy. This can increase your chance of falling. Ask your doctor what other things that you can do to help prevent falls. This information is not intended to replace advice given to you by your health care provider. Make sure you discuss any questions you have with your health care provider. Document Released: 10/09/2009 Document Revised: 05/20/2016 Document Reviewed: 01/17/2015 Elsevier Interactive Patient Education  2017 [employer].

## 2023-03-31 DIAGNOSIS — Z5189 Encounter for other specified aftercare: Secondary | ICD-10-CM | POA: Diagnosis not present

## 2023-04-21 DIAGNOSIS — Z5189 Encounter for other specified aftercare: Secondary | ICD-10-CM | POA: Diagnosis not present

## 2023-05-01 ENCOUNTER — Telehealth: Payer: Medicare PPO | Admitting: Physician Assistant

## 2023-05-01 DIAGNOSIS — B370 Candidal stomatitis: Secondary | ICD-10-CM | POA: Diagnosis not present

## 2023-05-02 MED ORDER — NYSTATIN 100000 UNIT/ML MT SUSP: 5.0000 mL | Freq: Four times a day (QID) | OROMUCOSAL | 0 refills | Status: AC

## 2023-05-02 NOTE — Progress Notes (Signed)
E-Visit for Ginette Pitman  We are sorry that you are not feeling well.  Here is how we plan to help!  Based on what you have shared with me, it appears that you do have Thrush.     The following medications should decrease the discomfort and help with healing. Nystatin suspension Use 4 times daily for 5-7 days.   Prevention: Talk to your doctor if you are taking meds that are known to cause mouth ulcers such as:   Anti-inflammatory drugs (for example Ibuprofen, Naproxen sodium), pain killers, Beta blockers, Oral nicotine replacement drugs, Some street drugs (heroin).   Avoid allowing any tablets to dissolve in your mouth that are meant to swallowed whole Avoid foods/drinks that trigger or worsen symptoms Keep your mouth clean with daily brushing and flossing  Home Care: The goal with treatment is to ease the pain where ulcers occur and help them heal as quickly as possible.  There is no medical treatment to prevent mouth ulcers from coming back or recurring.  Avoid spicy and acidic foods Eat soft foods and avoid rough, crunchy foods Avoid chewing gum Do not use toothpaste that contains sodium lauryl sulphite Use a straw to drink which helps avoid liquids toughing the ulcers near the front of your mouth Use a very soft toothbrush If you have dentures or dental hardware that you feel is not fitting well or contributing to his, please see your dentist. Use saltwater mouthwash which helps healing. Dissolve a  teaspoon of salt in a glass of warm water. Swish around your mouth and spit it out. This can be used as needed if it is soothing.   GET HELP RIGHT AWAY IF: Persistent ulcers require checking IN PERSON (face to face). Any mouth lesion lasting longer than a month should be seen by your DENTIST as soon as possible for evaluation for possible oral cancer. If you have a non-painful ulcer in 1 or more areas of your mouth Ulcers that are spreading, are very large or particularly painful Ulcers last  longer than one week without improving on treatment If you develop a fever, swollen glands and begin to feel unwell Ulcers that developed after starting a new medication MAKE SURE YOU: Understand these instructions. Will watch your condition. Will get help right away if you are not doing well or get worse.  Thank you for choosing an e-visit.  Your e-visit answers were reviewed by a board certified advanced clinical practitioner to complete your personal care plan. Depending upon the condition, your plan could have included both over the counter or prescription medications.  Please review your pharmacy choice. Make sure the pharmacy is open so you can pick up prescription now. If there is a problem, you may contact your provider through [employer] and have the prescription routed to another pharmacy.  Your safety is important to [place]. If you have drug allergies check your prescription carefully.   For the next 24 hours you can use MyChart to ask questions about today's visit, request a non-urgent call back, or ask for a work or school excuse. You will get an email in the next two days asking about your experience. I hope that your e-visit has been valuable and will speed your recovery.  I have spent 5 minutes in review of e-visit questionnaire, review and updating patient chart, medical decision making and response to patient.   Margaretann Loveless, PA-C

## 2023-05-20 DIAGNOSIS — Z5189 Encounter for other specified aftercare: Secondary | ICD-10-CM | POA: Diagnosis not present

## 2023-05-26 DIAGNOSIS — H53143 Visual discomfort, bilateral: Secondary | ICD-10-CM | POA: Diagnosis not present

## 2023-06-15 DIAGNOSIS — Z5189 Encounter for other specified aftercare: Secondary | ICD-10-CM | POA: Diagnosis not present

## 2023-06-20 DIAGNOSIS — J3089 Other allergic rhinitis: Secondary | ICD-10-CM | POA: Diagnosis not present

## 2023-06-21 ENCOUNTER — Telehealth: Payer: Self-pay | Admitting: Gastroenterology

## 2023-06-21 DIAGNOSIS — K2100 Gastro-esophageal reflux disease with esophagitis, without bleeding: Secondary | ICD-10-CM

## 2023-06-21 MED ORDER — PANTOPRAZOLE SODIUM 40 MG PO TBEC: 40.0000 mg | DELAYED_RELEASE_TABLET | Freq: Every day | ORAL | 0 refills | Status: DC

## 2023-06-21 NOTE — Telephone Encounter (Signed)
PT is calling to get a refill on pantoprazole. It should be sent to [facility]

## 2023-06-21 NOTE — Telephone Encounter (Signed)
Prescription sent to patient's pharmacy.

## 2023-07-13 DIAGNOSIS — Z0000 Encounter for general adult medical examination without abnormal findings: Secondary | ICD-10-CM | POA: Diagnosis not present

## 2023-07-13 DIAGNOSIS — B342 Coronavirus infection, unspecified: Secondary | ICD-10-CM | POA: Diagnosis not present

## 2023-07-25 DIAGNOSIS — Z5189 Encounter for other specified aftercare: Secondary | ICD-10-CM | POA: Diagnosis not present

## 2023-07-27 DIAGNOSIS — Z5189 Encounter for other specified aftercare: Secondary | ICD-10-CM | POA: Diagnosis not present

## 2023-07-29 DIAGNOSIS — Z5189 Encounter for other specified aftercare: Secondary | ICD-10-CM | POA: Diagnosis not present

## 2023-08-01 DIAGNOSIS — Z5189 Encounter for other specified aftercare: Secondary | ICD-10-CM | POA: Diagnosis not present

## 2023-08-03 DIAGNOSIS — Z5189 Encounter for other specified aftercare: Secondary | ICD-10-CM | POA: Diagnosis not present

## 2023-08-13 ENCOUNTER — Ambulatory Visit
Admission: EM | Admit: 2023-08-13 | Discharge: 2023-08-13 | Disposition: A | Discharge status: Home / Self Care | Payer: Medicare PPO | Attending: Internal Medicine | Admitting: Internal Medicine

## 2023-08-13 DIAGNOSIS — S61210A Laceration without foreign body of right index finger without damage to nail, initial encounter: Secondary | ICD-10-CM | POA: Diagnosis not present

## 2023-08-13 NOTE — ED Triage Notes (Signed)
"  I cut my index finger of right hand".  UTD with Td/Tdap. DOI: 08-13-2023 "1100 am".

## 2023-08-13 NOTE — Discharge Instructions (Signed)

## 2023-08-13 NOTE — ED Provider Notes (Signed)
[facility]    CSN: [id number] Arrival date & time: 08/13/23  1124      History   Chief Complaint Chief Complaint  Patient presents with   Laceration    Finger    HPI Brian [name] is a 77 y.o. male.   Patient presents to urgent care for evaluation of laceration to the pad of the right pointer finger that happened approximately 2 hours ago.  No numbness or tingling distally to injury.  He was working on his car and accidentally sliced the tip of his right pointer finger on a car part.  Tetanus injection has been within the last 5 years.  No difficulty with range of motion of the affected digit.  Bleeding is controlled.  No use of anticoagulation daily.     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  It was 5 mm in size.   A sessile  polyp was found the the distal transverse colon.  It was 3 mm in size.   Moderate diverticulosis was found in the sigmoid colon."    COLONOSCOPY  06/17/2020   CYSTOSCOPY  10/26/2012   Procedure: CYSTOSCOPY FLEXIBLE;  Surgeon: Anner Crete, MD;  Location: [facility];  Service: Urology;  Laterality: N/A;   HEMORRHOID SURGERY  05-19-2006   PPH   POLYPECTOMY     RADIOACTIVE SEED IMPLANT  10/26/2012   Procedure: RADIOACTIVE SEED IMPLANT;  Surgeon: Anner Crete, MD;  Location: [facility];  Service: Urology;  Laterality: N/A;   TONSILLECTOMY  6492   77 years old   UPPER GASTROINTESTINAL ENDOSCOPY  06/17/2020   WRIST ARTHROSCOPY  03-27-2003   DEBRIDEMENT VOLAR ULNAR  & SCAPHOLUNATE LIGAMENT TEARS OF RIGHT WRIST       Home Medications    Prior to Admission medications   Medication Sig Start Date End Date Taking? Authorizing Provider  Multiple Vitamins-Minerals (MACUVITE PO) Take 1 tablet by mouth daily.   Yes [provider]  pantoprazole (PROTONIX) 40 MG tablet Take 1 tablet (40 mg total) by mouth daily. 06/21/23  Yes Meryl Dare, MD  PRESCRIPTION MEDICATION Inject into the muscle every 30 (thirty) days. ALLERGY INJECTION'S MONTHLY   Yes  [provider]  vitamin C (ASCORBIC ACID) 500 MG tablet Take 500 mg by mouth daily.    Yes [provider]  EPIPEN 2-PAK 0.3 MG/0.3ML SOAJ injection Inject 1 mg into the muscle as needed. 11/05/13   [provider]  SUMAtriptan (IMITREX) 100 MG tablet Take 25 mg by mouth every 2 (two) hours as needed.     [provider]    Family History Family History  Problem Relation Age of Onset   Hypertension Father    Heart failure Father    Colon polyps Father    Diabetes Mother    Hypertension Mother    Hypertension Maternal Grandmother    Diabetes Maternal Grandmother    Stroke Maternal Grandfather        > 86   Bone cancer Paternal Grandfather    Colon cancer Neg Hx     Esophageal cancer Neg Hx    Rectal cancer Neg Hx    Stomach cancer Neg Hx     Social History Social History   Tobacco Use   Smoking status: Never   Smokeless tobacco: Never  Vaping Use   Vaping status: Never Used  Substance Use Topics   Alcohol use: No   Drug use: No     Allergies   Patient has no known allergies.   Review of Systems Review of Systems Per HPI  Physical Exam Triage Vital Signs ED Triage Vitals  Encounter Vitals Group     BP 08/13/23 1204 (!) 160/80     Systolic BP Percentile --      Diastolic BP Percentile --      Pulse Rate 08/13/23 1204 (!) 52     Resp 08/13/23 1204 18     Temp 08/13/23 1204 98.2 F (36.8 C)     Temp Source 08/13/23 1204 Oral     SpO2 08/13/23 1204 97 %     Weight 08/13/23 1202 155 lb (70.3 kg)     Height 08/13/23 1202 5\' 8"  (1.727 m)     Head Circumference --      Peak Flow --      Pain Score 08/13/23 1159 0     Pain Loc --      Pain Education --      Exclude from Growth Chart --    No data found.  Updated Vital Signs BP (!) 167/83 (BP Location: Left Arm)   Pulse (!) 54   Temp 98.2 F (36.8 C) (Oral)   Resp 18   Ht 5\' 8"  (1.727 m)   Wt 155 lb (70.3 kg)   SpO2 97%   BMI 23.57 kg/m   Visual Acuity Right Eye Distance:   Left Eye Distance:   Bilateral Distance:    Right Eye Near:   Left Eye Near:    Bilateral Near:     Physical Exam Vitals and nursing note reviewed.  Constitutional:      Appearance: He is not ill-appearing or toxic-appearing.  HENT:     Head: Normocephalic and atraumatic.     Right Ear: Hearing and external ear normal.     Left Ear: Hearing and external ear normal.     Nose: Nose normal.     Mouth/Throat:     Lips: Pink.  Eyes:     General: Lids are normal. Vision grossly intact. Gaze aligned appropriately.     Extraocular Movements: Extraocular movements intact.     Conjunctiva/sclera: Conjunctivae normal.  Pulmonary:     Effort: Pulmonary  effort is normal.  Musculoskeletal:      Right hand: Laceration (L-shaped laceration to the pad of the distal right pointer finger, see image below) and tenderness (Laceration) present. No swelling, deformity or bony tenderness. Normal range of motion. Normal strength. Normal sensation. There is no disruption of two-point discrimination. Normal capillary refill (Cap refill to affected digit less than 2.). Normal pulse (+2 right radial pulse).     Left hand: Normal.     Cervical back: Neck supple.     Comments: Full ROM of the right pointer finger.   Skin:    General: Skin is warm and dry.     Capillary Refill: Capillary refill takes less than 2 seconds.     Findings: No rash.  Neurological:     General: No focal deficit present.     Mental Status: He is alert and oriented to person, place, and time. Mental status is at baseline.     Cranial Nerves: No dysarthria or facial asymmetry.  Psychiatric:        Mood and Affect: Mood normal.        Speech: Speech normal.        Behavior: Behavior normal.        Thought Content: Thought content normal.        Judgment: Judgment normal.   Prior to laceration repair   Right pointer finger after laceration repair    UC Treatments / Results  Labs (all labs ordered are listed, but only abnormal results are displayed) Labs Reviewed - No data to display  EKG   Radiology No results found.  Procedures Laceration Repair  Date/Time: 08/13/2023 1:42 PM  Performed by: Carlisle Beers, FNP Authorized by: Carlisle Beers, FNP   Consent:    Consent obtained:  Verbal   Consent given by:  Patient   Risks, benefits, and alternatives were discussed: yes     Risks discussed:  Infection, need for additional repair, nerve damage, pain, poor cosmetic result, poor wound healing, retained foreign body, tendon damage and vascular damage   Alternatives discussed:  No treatment Universal protocol:    Patient identity confirmed:  Verbally with patient Anesthesia:    Anesthesia  method:  Nerve block   Block needle gauge:  25 G   Block anesthetic:  Lidocaine 1% w/o epi   Block injection procedure:  Anatomic landmarks identified, anatomic landmarks palpated, negative aspiration for blood, introduced needle and incremental injection   Block outcome:  Anesthesia achieved Laceration details:    Location:  Finger   Finger location:  R index finger   Length (cm):  2   Depth (mm):  5 Exploration:    Wound exploration: entire depth of wound visualized     Wound extent: no foreign bodies/material noted   Treatment:    Area cleansed with:  Povidone-iodine   Amount of cleaning:  Standard Skin repair:    Repair method:  Sutures   Suture size:  4-0   Suture material:  Nylon   Suture technique:  Simple interrupted   Number of sutures:  3 Approximation:    Approximation:  Close Repair type:    Repair type:  Simple Post-procedure details:    Dressing:  Non-adherent dressing   Procedure completion:  Tolerated well, no immediate complications  (including critical care time)  Medications Ordered in UC Medications - No data to display  Initial Impression / Assessment and Plan / UC Course  I have reviewed the triage vital signs and the  nursing notes.  Pertinent labs & imaging results that were available during my care of the patient were reviewed by me and considered in my medical decision making (see chart for details).   1. Laceration of right index finger without foreign body without damage to nail Laceration repaired, see procedure note above for details. Discussed wound care and cleaning at home.  Imaging: Deferred based on stable musculoskeletal exam. Infection return precautions discussed.  Suture removal in 10 days.  Tdap is up to date.  Tylenol as needed for pain at home.  Advised to rest and avoid activities that may increase tension to wound/sutures or expose wound to infection. Excuse note given.   Counseled patient on potential for adverse effects with  medications prescribed/recommended today, strict ER and return-to-clinic precautions discussed, patient verbalized understanding.    Final Clinical Impressions(s) / UC Diagnoses   Final diagnoses:  Laceration of right index finger without foreign body without damage to nail, initial encounter     Discharge Instructions      Wound care: Please keep the area surrounding the wound/sutures clean and dry for the next 24 hours. After 24 hours, you may get the wound wet. Gently clean wound with antibacterial soap. Do not scrub wound. Cover the area with a nonstick bandage and change the bandage 2 times a day.   You should have the sutures removed in 10 days by your primary care provider or at urgent care. Return sooner than 10 days if you experience discharge from your laceration, redness around your laceration, warmth around your laceration, or fever.   You may take over the counter medicines as needed for aches and pains once the numbing wears off.   Thanks for letting me fix your cut today!    ED Prescriptions   None    PDMP not reviewed this encounter.   Carlisle Beers, [place] 08/13/23 1345

## 2023-08-23 ENCOUNTER — Ambulatory Visit
Admission: RE | Admit: 2023-08-23 | Discharge: 2023-08-23 | Disposition: A | Discharge status: Home / Self Care | Payer: Medicare PPO | Source: Ambulatory Visit | Attending: Internal Medicine | Admitting: Internal Medicine

## 2023-08-23 DIAGNOSIS — Z4802 Encounter for removal of sutures: Secondary | ICD-10-CM

## 2023-08-23 NOTE — ED Triage Notes (Signed)
No Concerns. No Questions.

## 2023-08-31 DIAGNOSIS — Z5189 Encounter for other specified aftercare: Secondary | ICD-10-CM | POA: Diagnosis not present

## 2023-09-07 DIAGNOSIS — D229 Melanocytic nevi, unspecified: Secondary | ICD-10-CM | POA: Diagnosis not present

## 2023-09-07 DIAGNOSIS — L820 Inflamed seborrheic keratosis: Secondary | ICD-10-CM | POA: Diagnosis not present

## 2023-09-07 DIAGNOSIS — L578 Other skin changes due to chronic exposure to nonionizing radiation: Secondary | ICD-10-CM | POA: Diagnosis not present

## 2023-09-07 DIAGNOSIS — L814 Other melanin hyperpigmentation: Secondary | ICD-10-CM | POA: Diagnosis not present

## 2023-09-07 DIAGNOSIS — D1801 Hemangioma of skin and subcutaneous tissue: Secondary | ICD-10-CM | POA: Diagnosis not present

## 2023-09-07 DIAGNOSIS — L821 Other seborrheic keratosis: Secondary | ICD-10-CM | POA: Diagnosis not present

## 2023-09-28 DIAGNOSIS — Z5189 Encounter for other specified aftercare: Secondary | ICD-10-CM | POA: Diagnosis not present

## 2023-10-04 DIAGNOSIS — G43019 Migraine without aura, intractable, without status migrainosus: Secondary | ICD-10-CM | POA: Diagnosis not present

## 2023-10-26 DIAGNOSIS — Z5189 Encounter for other specified aftercare: Secondary | ICD-10-CM | POA: Diagnosis not present

## 2023-10-27 DIAGNOSIS — H524 Presbyopia: Secondary | ICD-10-CM | POA: Diagnosis not present

## 2023-10-27 DIAGNOSIS — H53143 Visual discomfort, bilateral: Secondary | ICD-10-CM | POA: Diagnosis not present

## 2023-10-27 DIAGNOSIS — H5201 Hypermetropia, right eye: Secondary | ICD-10-CM | POA: Diagnosis not present

## 2023-10-27 DIAGNOSIS — G43109 Migraine with aura, not intractable, without status migrainosus: Secondary | ICD-10-CM | POA: Diagnosis not present

## 2023-11-23 DIAGNOSIS — Z5189 Encounter for other specified aftercare: Secondary | ICD-10-CM | POA: Diagnosis not present

## 2023-12-08 ENCOUNTER — Other Ambulatory Visit: Payer: Self-pay | Admitting: Medical Genetics

## 2023-12-11 ENCOUNTER — Other Ambulatory Visit: Payer: Self-pay | Admitting: Gastroenterology

## 2023-12-11 DIAGNOSIS — K2100 Gastro-esophageal reflux disease with esophagitis, without bleeding: Secondary | ICD-10-CM

## 2023-12-12 ENCOUNTER — Other Ambulatory Visit
Admission: RE | Admit: 2023-12-12 | Discharge: 2023-12-12 | Disposition: A | Discharge status: Home / Self Care | Payer: Self-pay | Source: Ambulatory Visit | Attending: Medical Genetics | Admitting: Medical Genetics

## 2023-12-19 ENCOUNTER — Encounter: Payer: Medicare PPO | Admitting: Internal Medicine

## 2023-12-19 DIAGNOSIS — Z8546 Personal history of malignant neoplasm of prostate: Secondary | ICD-10-CM | POA: Diagnosis not present

## 2023-12-19 DIAGNOSIS — C61 Malignant neoplasm of prostate: Secondary | ICD-10-CM | POA: Diagnosis not present

## 2023-12-20 LAB — GENECONNECT MOLECULAR SCREEN: Genetic Analysis Overall Interpretation: NEGATIVE

## 2023-12-22 DIAGNOSIS — Z5189 Encounter for other specified aftercare: Secondary | ICD-10-CM | POA: Diagnosis not present

## 2023-12-26 DIAGNOSIS — C61 Malignant neoplasm of prostate: Secondary | ICD-10-CM | POA: Diagnosis not present

## 2023-12-26 DIAGNOSIS — N5201 Erectile dysfunction due to arterial insufficiency: Secondary | ICD-10-CM | POA: Diagnosis not present

## 2023-12-26 DIAGNOSIS — R350 Frequency of micturition: Secondary | ICD-10-CM | POA: Diagnosis not present

## 2024-01-02 ENCOUNTER — Other Ambulatory Visit: Payer: Self-pay | Admitting: Gastroenterology

## 2024-01-02 DIAGNOSIS — R519 Headache, unspecified: Secondary | ICD-10-CM | POA: Diagnosis not present

## 2024-01-02 DIAGNOSIS — J301 Allergic rhinitis due to pollen: Secondary | ICD-10-CM | POA: Diagnosis not present

## 2024-01-02 DIAGNOSIS — J3089 Other allergic rhinitis: Secondary | ICD-10-CM | POA: Diagnosis not present

## 2024-01-02 DIAGNOSIS — K2100 Gastro-esophageal reflux disease with esophagitis, without bleeding: Secondary | ICD-10-CM

## 2024-01-08 ENCOUNTER — Encounter: Payer: Self-pay | Admitting: Internal Medicine

## 2024-01-08 NOTE — Patient Instructions (Addendum)
 Blood work was ordered.       Medications changes include :   None     Return in about 1 year (around 01/09/2025) for Physical Exam.   Health Maintenance, Male Adopting a healthy lifestyle and getting preventive care are important in promoting health and wellness. Ask your health care provider about: The right schedule for you to have regular tests and exams. Things you can do on your own to prevent diseases and keep yourself healthy. What should I know about diet, weight, and exercise? Eat a healthy diet  Eat a diet that includes plenty of vegetables, fruits, low-fat dairy products, and lean protein. Do not eat a lot of foods that are high in solid fats, added sugars, or sodium. Maintain a healthy weight Body mass index (BMI) is a measurement that can be used to identify possible weight problems. It estimates body fat based on height and weight. Your health care provider can help determine your BMI and help you achieve or maintain a healthy weight. Get regular exercise Get regular exercise. This is one of the most important things you can do for your health. Most adults should: Exercise for at least 150 minutes each week. The exercise should increase your heart rate and make you sweat (moderate-intensity exercise). Do strengthening exercises at least twice a week. This is in addition to the moderate-intensity exercise. Spend less time sitting. Even light physical activity can be beneficial. Watch cholesterol and blood lipids Have your blood tested for lipids and cholesterol at 76 years of age, then have this test every 5 years. You may need to have your cholesterol levels checked more often if: Your lipid or cholesterol levels are high. You are older than 76 years of age. You are at high risk for heart disease. What should I know about cancer screening? Many types of cancers can be detected early and may often be prevented. Depending on your health history and family  history, you may need to have cancer screening at various ages. This may include screening for: Colorectal cancer. Prostate cancer. Skin cancer. Lung cancer. What should I know about heart disease, diabetes, and high blood pressure? Blood pressure and heart disease High blood pressure causes heart disease and increases the risk of stroke. This is more likely to develop in people who have high blood pressure readings or are overweight. Talk with your health care provider about your target blood pressure readings. Have your blood pressure checked: Every 3-5 years if you are 79-12 years of age. Every year if you are 42 years old or older. If you are between the ages of 65 and 65 and are a current or former smoker, ask your health care provider if you should have a one-time screening for abdominal aortic aneurysm (AAA). Diabetes Have regular diabetes screenings. This checks your fasting blood sugar level. Have the screening done: Once every three years after age 43 if you are at a normal weight and have a low risk for diabetes. More often and at a younger age if you are overweight or have a high risk for diabetes. What should I know about preventing infection? Hepatitis B If you have a higher risk for hepatitis B, you should be screened for this virus. Talk with your health care provider to find out if you are at risk for hepatitis B infection. Hepatitis C Blood testing is recommended for: Everyone born from [birth year] through 1963. Anyone with known risk factors for hepatitis C. Sexually transmitted  infections (STIs) You should be screened each year for STIs, including gonorrhea and chlamydia, if: You are sexually active and are younger than 76 years of age. You are older than 76 years of age and your health care provider tells you that you are at risk for this type of infection. Your sexual activity has changed since you were last screened, and you are at increased risk for chlamydia or  gonorrhea. Ask your health care provider if you are at risk. Ask your health care provider about whether you are at high risk for HIV. Your health care provider may recommend a prescription medicine to help prevent HIV infection. If you choose to take medicine to prevent HIV, you should first get tested for HIV. You should then be tested every 3 months for as long as you are taking the medicine. Follow these instructions at home: Alcohol use Do not drink alcohol if your health care provider tells you not to drink. If you drink alcohol: Limit how much you have to 0-2 drinks a day. Know how much alcohol is in your drink. In the [place] one drink equals one 12 oz bottle of beer (355 mL), one 5 oz glass of wine (148 mL), or one 1 oz glass of hard liquor (44 mL). Lifestyle Do not use any products that contain nicotine or tobacco. These products include cigarettes, chewing tobacco, and vaping devices, such as e-cigarettes. If you need help quitting, ask your health care provider. Do not use street drugs. Do not share needles. Ask your health care provider for help if you need support or information about quitting drugs. General instructions Schedule regular health, dental, and eye exams. Stay current with your vaccines. Tell your health care provider if: You often feel depressed. You have ever been abused or do not feel safe at home. Summary Adopting a healthy lifestyle and getting preventive care are important in promoting health and wellness. Follow your health care provider's instructions about healthy diet, exercising, and getting tested or screened for diseases. Follow your health care provider's instructions on monitoring your cholesterol and blood pressure. This information is not intended to replace advice given to you by your health care provider. Make sure you discuss any questions you have with your health care provider. Document Revised: 05/04/2021 Document Reviewed: 05/04/2021 Elsevier  Patient Education  2024 [employer].

## 2024-01-08 NOTE — Progress Notes (Signed)
 Subjective:    Patient ID: Brian [name], male    DOB: 07/10/1946, 78 y.o.   MRN: [id number]     HPI Brian [name] is here for a physical exam and his chronic medical problems.  Saw ortho yesterday for hip pain - to have an MRI.  ? Torn labrum.    He only has occ has pain - it is more positional.  It is a knife like pain.    Otherwise doing well.   Medications and allergies reviewed with patient and updated if appropriate.  Current Outpatient Medications on File Prior to Visit  Medication Sig Dispense Refill   EPIPEN 2-PAK 0.3 MG/0.3ML SOAJ injection Inject 1 mg into the muscle as needed.     Multiple Vitamins-Minerals (MACUVITE PO) Take 1 tablet by mouth daily.     pantoprazole  (PROTONIX ) 40 MG tablet TAKE 1 TABLET BY MOUTH EVERY DAY 90 tablet 0   PRESCRIPTION MEDICATION Inject into the muscle every 30 (thirty) days. ALLERGY INJECTION'S MONTHLY     SUMAtriptan (IMITREX) 100 MG tablet Take 25 mg by mouth every 2 (two) hours as needed.      vitamin C (ASCORBIC ACID) 500 MG tablet Take 500 mg by mouth daily.      No current facility-administered medications on file prior to visit.    Review of Systems  Constitutional:  Negative for fever.  Eyes:  Negative for visual disturbance.  Respiratory:  Negative for cough, shortness of breath and wheezing.   Cardiovascular:  Positive for palpitations (occ, transient). Negative for chest pain and leg swelling.  Gastrointestinal:  Negative for abdominal pain, blood in stool, constipation and diarrhea.       No gerd  Genitourinary:  Negative for dysuria and hematuria.  Musculoskeletal:  Positive for arthralgias (right hip, right shoulder) and back pain (left lower back).  Skin:  Negative for rash.  Neurological:  Positive for dizziness (occ has episodes). Negative for light-headedness and headaches.  Psychiatric/Behavioral:  Negative for dysphoric mood. The patient is not nervous/anxious.        Objective:   Vitals:   01/10/24 0904   BP: 122/78  Pulse: 69  Temp: 97.9 F (36.6 C)  SpO2: 98%   Filed Weights   01/10/24 0904  Weight: 155 lb (70.3 kg)   Body mass index is 23.57 kg/m.  BP Readings from Last 3 Encounters:  01/10/24 122/78  08/13/23 (!) 167/83  12/15/22 128/74    Wt Readings from Last 3 Encounters:  01/10/24 155 lb (70.3 kg)  08/23/23 154 lb 15.7 oz (70.3 kg)  08/13/23 155 lb (70.3 kg)      Physical Exam Constitutional: He appears well-developed and well-nourished. No distress.  HENT:  Head: Normocephalic and atraumatic.  Right Ear: External ear normal.  Left Ear: External ear normal.  Normal ear canals and TM b/l  Mouth/Throat: Oropharynx is clear and moist. Eyes: Conjunctivae and EOM are normal.  Neck: Neck supple. No tracheal deviation present. No thyromegaly present.  No carotid bruit  Cardiovascular: Normal rate, regular rhythm, normal heart sounds and intact distal pulses.   No murmur heard.  No lower extremity edema. Pulmonary/Chest: Effort normal and breath sounds normal. No respiratory distress. He has no wheezes. He has no rales.  Abdominal: Soft. He exhibits no distension. There is no tenderness.  Genitourinary: deferred  Lymphadenopathy:   He has no cervical adenopathy.  Skin: Skin is warm and dry. He is not diaphoretic.  Psychiatric: He has a normal mood and affect.  His behavior is normal.         Assessment & Plan:   Physical exam: Screening blood work  ordered Exercise   regular - gym Weight  normal Substance abuse   none   Reviewed recommended immunizations.   Health Maintenance  Topic Date Due   COVID-19 Vaccine (5 - 2024-25 season) 01/26/2024 (Originally 08/28/2023)   INFLUENZA VACCINE  03/26/2024 (Originally 07/28/2023)   Medicare Annual Wellness (AWV)  03/23/2024   Colonoscopy  06/17/2025   DTaP/Tdap/Td (3 - Td or Tdap) 09/07/2028   Pneumonia Vaccine 5+ Years old  Completed   Hepatitis C Screening  Completed   Zoster Vaccines- Shingrix  Completed    HPV VACCINES  Aged Out     See Problem List for Assessment and Plan of chronic medical problems.

## 2024-01-09 DIAGNOSIS — M25551 Pain in right hip: Secondary | ICD-10-CM | POA: Diagnosis not present

## 2024-01-10 ENCOUNTER — Encounter: Payer: Self-pay | Admitting: Internal Medicine

## 2024-01-10 ENCOUNTER — Ambulatory Visit (INDEPENDENT_AMBULATORY_CARE_PROVIDER_SITE_OTHER): Payer: Medicare PPO | Admitting: Internal Medicine

## 2024-01-10 VITALS — BP 122/78 | HR 69 | Temp 97.9°F | Ht 68.0 in | Wt 155.0 lb

## 2024-01-10 DIAGNOSIS — K2100 Gastro-esophageal reflux disease with esophagitis, without bleeding: Secondary | ICD-10-CM | POA: Diagnosis not present

## 2024-01-10 DIAGNOSIS — Z0000 Encounter for general adult medical examination without abnormal findings: Secondary | ICD-10-CM

## 2024-01-10 DIAGNOSIS — G43809 Other migraine, not intractable, without status migrainosus: Secondary | ICD-10-CM

## 2024-01-10 DIAGNOSIS — Z8546 Personal history of malignant neoplasm of prostate: Secondary | ICD-10-CM | POA: Diagnosis not present

## 2024-01-10 DIAGNOSIS — I700 Atherosclerosis of aorta: Secondary | ICD-10-CM

## 2024-01-10 LAB — CBC WITH DIFFERENTIAL/PLATELET
Basophils Absolute: 0 10*3/uL (ref 0.0–0.1)
Basophils Relative: 1 % (ref 0.0–3.0)
Eosinophils Absolute: 0.1 10*3/uL (ref 0.0–0.7)
Eosinophils Relative: 2.1 % (ref 0.0–5.0)
HCT: 43.6 % (ref 39.0–52.0)
Hemoglobin: 14.5 g/dL (ref 13.0–17.0)
Lymphocytes Relative: 27.4 % (ref 12.0–46.0)
Lymphs Abs: 1.4 10*3/uL (ref 0.7–4.0)
MCHC: 33.2 g/dL (ref 30.0–36.0)
MCV: 90.1 fL (ref 78.0–100.0)
Monocytes Absolute: 0.4 10*3/uL (ref 0.1–1.0)
Monocytes Relative: 8.2 % (ref 3.0–12.0)
Neutro Abs: 3.1 10*3/uL (ref 1.4–7.7)
Neutrophils Relative %: 61.3 % (ref 43.0–77.0)
Platelets: 202 10*3/uL (ref 150.0–400.0)
RBC: 4.83 Mil/uL (ref 4.22–5.81)
RDW: 13.8 % (ref 11.5–15.5)
WBC: 5 10*3/uL (ref 4.0–10.5)

## 2024-01-10 LAB — COMPREHENSIVE METABOLIC PANEL
ALT: 14 U/L (ref 0–53)
AST: 20 U/L (ref 0–37)
Albumin: 4.7 g/dL (ref 3.5–5.2)
Alkaline Phosphatase: 69 U/L (ref 39–117)
BUN: 18 mg/dL (ref 6–23)
CO2: 27 meq/L (ref 19–32)
Calcium: 9.6 mg/dL (ref 8.4–10.5)
Chloride: 106 meq/L (ref 96–112)
Creatinine, Ser: 0.97 mg/dL (ref 0.40–1.50)
GFR: 76.38 mL/min (ref >60.00)
Glucose, Bld: 89 mg/dL (ref 70–99)
Potassium: 4.6 meq/L (ref 3.5–5.1)
Sodium: 139 meq/L (ref 135–145)
Total Bilirubin: 0.9 mg/dL (ref 0.2–1.2)
Total Protein: 7 g/dL (ref 6.0–8.3)

## 2024-01-10 LAB — LIPID PANEL
Cholesterol: 149 mg/dL (ref 0–200)
HDL: 41.7 mg/dL (ref >39.00)
LDL Cholesterol: 86 mg/dL (ref 0–99)
NonHDL: 107.36
Total CHOL/HDL Ratio: 4
Triglycerides: 108 mg/dL (ref 0.0–149.0)
VLDL: 21.6 mg/dL (ref 0.0–40.0)

## 2024-01-10 LAB — TSH: TSH: 4.07 u[IU]/mL (ref 0.35–5.50)

## 2024-01-10 NOTE — Assessment & Plan Note (Signed)
 Chronic Controlled Sees Dr. Aneta Mins Imitrex as needed

## 2024-01-10 NOTE — Assessment & Plan Note (Signed)
 Chronic Following with GI Moderate hiatal hernia GERD controlled Continue pantoprazole 40 mg daily

## 2024-01-10 NOTE — Assessment & Plan Note (Signed)
 Chronic Coronary calcium score 17 in 2021 Currently not on medication Continue regular exercise, healthy diet Lipids today Consider repeat CAC next year to evaluate need for statin

## 2024-01-10 NOTE — Assessment & Plan Note (Signed)
History of prostate cancer Following with urology No evidence of recurrence 

## 2024-01-17 DIAGNOSIS — M25551 Pain in right hip: Secondary | ICD-10-CM | POA: Diagnosis not present

## 2024-01-20 DIAGNOSIS — M25551 Pain in right hip: Secondary | ICD-10-CM | POA: Diagnosis not present

## 2024-01-25 DIAGNOSIS — Z5189 Encounter for other specified aftercare: Secondary | ICD-10-CM | POA: Diagnosis not present

## 2024-02-10 ENCOUNTER — Ambulatory Visit: Payer: Medicare PPO

## 2024-02-10 DIAGNOSIS — Z0000 Encounter for general adult medical examination without abnormal findings: Secondary | ICD-10-CM

## 2024-02-10 NOTE — Progress Notes (Signed)
Subjective:   Brian [name] is a 78 y.o. male who presents for Medicare Annual/Subsequent preventive examination.  Visit Complete: Virtual I connected with  Brian [name] on 02/10/24 by a video and audio enabled telemedicine application and verified that I am speaking with the correct person using two identifiers.  Patient Location: Home  Provider Location: Office/Clinic  I discussed the limitations of evaluation and management by telemedicine. The patient expressed understanding and agreed to proceed.  Vital Signs: Because this visit was a virtual/telehealth visit, some criteria may be missing or patient reported. Any vitals not documented were not able to be obtained and vitals that have been documented are patient reported.  Patient Medicare AWV questionnaire was completed by the patient on 02/09/2024; I have confirmed that all information answered by patient is correct and no changes since this date.  Cardiac Risk Factors include: advanced age (>57men, >48 women);male gender     Objective:    Today's Vitals   There is no height or weight on file to calculate BMI.     02/10/2024    2:58 PM 03/24/2023   10:51 AM 03/22/2022    8:49 AM 03/11/2021    1:28 PM 09/27/2020    3:05 PM 06/10/2015   10:28 AM 09/19/2014    9:09 AM  Advanced Directives  Does Patient Have a Medical Advance Directive? Yes Yes Yes Yes Yes Yes --  Type of Estate agent of [place];Living will Living will;Healthcare Power of [employer] Power of [place];Living will Living will;Healthcare Power of [employer] Power of [place];Living will   Does patient want to make changes to medical advance directive?  No - Patient declined  No - Patient declined     Copy of Healthcare Power of Attorney in Chart? Yes - validated most recent copy scanned in chart (See row information) Yes - validated most recent copy scanned in chart (See row information) No - copy requested No - copy  requested       Current Medications (verified) Outpatient Encounter Medications as of 02/10/2024  Medication Sig   EPIPEN 2-PAK 0.3 MG/0.3ML SOAJ injection Inject 1 mg into the muscle as needed.   Multiple Vitamins-Minerals (MACUVITE PO) Take 1 tablet by mouth daily.   pantoprazole (PROTONIX) 40 MG tablet TAKE 1 TABLET BY MOUTH EVERY DAY   PRESCRIPTION MEDICATION Inject into the muscle every 30 (thirty) days. ALLERGY INJECTION'S MONTHLY   SUMAtriptan (IMITREX) 100 MG tablet Take 25 mg by mouth every 2 (two) hours as needed.    vitamin C (ASCORBIC ACID) 500 MG tablet Take 500 mg by mouth daily.    No facility-administered encounter medications on file as of 02/10/2024.    Allergies (verified) Patient has no known allergies.   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  It was 5 mm in size.   A sessile polyp was found the the distal transverse colon.  It was 3 mm in size.   Moderate diverticulosis was found in the sigmoid colon."    COLONOSCOPY  06/17/2020   CYSTOSCOPY  10/26/2012   Procedure: CYSTOSCOPY FLEXIBLE;  Surgeon: Anner Crete, MD;  Location: [facility];  Service: Urology;  Laterality: N/A;   HEMORRHOID SURGERY  05-19-2006   PPH   POLYPECTOMY     RADIOACTIVE SEED IMPLANT  10/26/2012   Procedure: RADIOACTIVE SEED IMPLANT;  Surgeon: Anner Crete, MD;  Location: [facility];  Service: Urology;  Laterality: N/A;   TONSILLECTOMY  4927   78 years old   UPPER GASTROINTESTINAL ENDOSCOPY   06/17/2020   WRIST ARTHROSCOPY  03-27-2003   DEBRIDEMENT VOLAR ULNAR  & SCAPHOLUNATE LIGAMENT TEARS OF RIGHT WRIST   Family History  Problem Relation Age of Onset   Hypertension Father    Heart failure Father    Colon polyps Father    Diabetes Mother    Hypertension Mother    Hypertension Maternal Grandmother    Diabetes Maternal Grandmother    Stroke Maternal Grandfather        > 25   Bone cancer Paternal Grandfather    Colon cancer Neg Hx    Esophageal cancer Neg Hx    Rectal cancer Neg Hx    Stomach cancer Neg Hx    Social History   Socioeconomic History   Marital status: Married    Spouse name: Not on file   Number of children: Not on file   Years of education: Not on file   Highest education level: Associate degree: occupational, Scientist, product/process development, or vocational program  Occupational History   Occupation: OWNER    Employer: [employer]    Comment: Systems developer parts  Tobacco Use   Smoking status: Never   Smokeless tobacco: Never  Vaping Use   Vaping status: Never Used  Substance and Sexual Activity   Alcohol use: No   Drug use: No   Sexual activity: Yes  Other Topics Concern   Not on file  Social History Narrative   Married over 40 years      2 daughters      03/20/12 PSA 4.03   Social Drivers of Health   Financial Resource Strain: Low Risk  (02/10/2024)   Overall Financial Resource Strain (CARDIA)    Difficulty of Paying Living Expenses: Not hard at all  Food Insecurity: No Food Insecurity (02/10/2024)   Hunger Vital Sign    Worried About Running Out of Food in the Last Year: Never true    Ran Out of Food in the Last Year: Never true  Transportation Needs: No Transportation Needs (02/10/2024)   PRAPARE - Administrator, Civil Service (Medical): No    Lack of Transportation (Non-Medical): No  Physical Activity: Sufficiently Active (02/10/2024)   Exercise Vital Sign    Days of Exercise per Week: 7 days    Minutes of Exercise per  Session: 50 min  Stress: No Stress Concern Present (02/10/2024)   [employer] of Occupational Health - Occupational Stress Questionnaire    Feeling of Stress : Not at all  Social Connections: Socially Integrated (02/10/2024)   Social Connection and Isolation Panel [NHANES]    Frequency of Communication with Friends and Family: Twice a week    Frequency of Social Gatherings with Friends and Family: Once a week    Attends Religious Services: More than 4 times per year    Active Member of [employer] or Organizations: Yes  Attends Engineer, structural: More than 4 times per year    Marital Status: Married    Tobacco Counseling Counseling given: Not Answered   Clinical Intake:  Pre-visit preparation completed: Yes  Pain : No/denies pain     Nutritional Risks: None Diabetes: No  How often do you need to have someone help you when you read instructions, pamphlets, or other written materials from your doctor or pharmacy?: 1 - Never  Interpreter Needed?: No  Information entered by :: NAllen LPN   Activities of Daily Living    02/09/2024    1:30 PM 03/24/2023   10:51 AM  In your present state of health, do you have any difficulty performing the following activities:  Hearing? 0 0  Vision? 0 0  Difficulty concentrating or making decisions? 0 0  Walking or climbing stairs? 0 0  Dressing or bathing? 0 0  Doing errands, shopping? 0 0  Preparing Food and eating ? N N  Using the Toilet? N N  In the past six months, have you accidently leaked urine? N N  Do you have problems with loss of bowel control? N N  Managing your Medications? N N  Managing your Finances? N N  Housekeeping or managing your Housekeeping? N N    Patient Care Team: Pincus Sanes, MD as PCP - General (Internal Medicine) Blima Ledger, OD as Consulting Physician (Optometry) Irena Cords, Enzo Montgomery, MD as Consulting Physician (Allergy and Immunology) Elmo Putt, MD as Referring Physician  (Neurology) Nita Sells, MD (Dermatology)  Indicate any recent Medical Services you may have received from other than [facility] providers in the past year (date may be approximate).     Assessment:   This is a routine wellness examination for Brian [name].  Hearing/Vision screen Hearing Screening - Comments:: Denies hearing issues Vision Screening - Comments:: Regular eye exams. [facility]   Goals Addressed             This Visit's Progress    Patient Stated       02/10/2024, keep moving       Depression Screen    02/10/2024    2:59 PM 01/10/2024    9:12 AM 01/10/2024    9:11 AM 03/24/2023   10:50 AM 12/15/2022    8:19 AM 03/22/2022    8:49 AM 03/22/2022    8:47 AM  PHQ 2/9 Scores  PHQ - 2 Score 0 0 0 0 0 0 0  PHQ- 9 Score 0 0   0      Fall Risk    02/09/2024    1:30 PM 01/10/2024    9:11 AM 03/24/2023   10:49 AM 03/22/2023    9:18 AM 12/15/2022    8:19 AM  Fall Risk   Falls in the past year? 0 0 0 0 0  Number falls in past yr: 0 0 0 0 0  Injury with Fall? 0 0 0  0  Risk for fall due to : No Fall Risks No Fall Risks No Fall Risks  No Fall Risks  Follow up Falls evaluation completed;Falls prevention discussed Falls evaluation completed Falls prevention discussed;Education provided;Falls evaluation completed  Falls evaluation completed    MEDICARE RISK AT HOME: Medicare Risk at Home Any stairs in or around the home?: (Patient-Rptd) Yes If so, are there any without handrails?: (Patient-Rptd) No Home free of loose throw rugs in walkways, pet beds, electrical cords, etc?: (Patient-Rptd) Yes Adequate lighting in your home to reduce risk of falls?: (  Patient-Rptd) Yes Life alert?: (Patient-Rptd) No Use of a cane, walker or w/c?: (Patient-Rptd) No Grab bars in the bathroom?: (Patient-Rptd) Yes Shower chair or bench in shower?: (Patient-Rptd) No Elevated toilet seat or a handicapped toilet?: (Patient-Rptd) No  TIMED UP AND GO:  Was the test performed?  No    Cognitive  Function:        02/10/2024    3:00 PM 03/24/2023   10:51 AM  6CIT Screen  What Year? 0 points 0 points  What month? 0 points 0 points  What time? 0 points 0 points  Count back from 20 0 points 0 points  Months in reverse 0 points 0 points  Repeat phrase 0 points 0 points  Total Score 0 points 0 points    Immunizations Immunization History  Administered Date(s) Administered   Fluad Quad(high Dose 65+) 08/24/2019, 09/03/2022   Influenza Whole 10/27/2012, 09/26/2013   Influenza, High Dose Seasonal PF 09/27/2017, 09/07/2018, 10/02/2018, 10/03/2019, 10/02/2020   Influenza-Unspecified 09/26/2014, 09/27/2015, 09/26/2016, 10/13/2021, 10/03/2023   PFIZER(Purple Top)SARS-COV-2 Vaccination 02/01/2020, 02/26/2020, 10/02/2020   Pfizer Covid-19 Vaccine Bivalent Booster 11yrs & up 11/09/2022   Pfizer(Comirnaty)Fall Seasonal Vaccine 12 years and older 10/19/2023   Pneumococcal Conjugate-13 01/28/2015   Pneumococcal Polysaccharide-23 12/14/2013, 09/26/2017, 10/02/2018, 10/03/2019, 10/02/2020   Respiratory Syncytial Virus Vaccine,Recomb Aduvanted(Arexvy) 01/10/2024   Tdap 10/01/2011, 09/07/2018   Zoster Recombinant(Shingrix) 01/23/2019, 07/03/2019   Zoster, Live 05/04/2011    TDAP status: Up to date  Flu Vaccine status: Up to date  Pneumococcal vaccine status: Up to date  Covid-19 vaccine status: Completed vaccines  Qualifies for Shingles Vaccine? Yes   Zostavax completed Yes   Shingrix Completed?: Yes  Screening Tests Health Maintenance  Topic Date Due   COVID-19 Vaccine (6 - 2024-25 season) 12/14/2023   Medicare Annual Wellness (AWV)  02/09/2025   Colonoscopy  06/17/2025   DTaP/Tdap/Td (3 - Td or Tdap) 09/07/2028   Pneumonia Vaccine 49+ Years old  Completed   INFLUENZA VACCINE  Completed   Hepatitis C Screening  Completed   Zoster Vaccines- Shingrix  Completed   HPV VACCINES  Aged Out    Health Maintenance  Health Maintenance Due  Topic Date Due   COVID-19 Vaccine (6 -  2024-25 season) 12/14/2023    Colorectal cancer screening: Type of screening: Colonoscopy. Completed 06/17/2020. Repeat every 5 years  Lung Cancer Screening: (Low Dose CT Chest recommended if Age 16-80 years, 20 pack-year currently smoking OR have quit w/in 15years.) does not qualify.   Lung Cancer Screening Referral: no  Additional Screening:  Hepatitis C Screening: does qualify; Completed 11/19/2015  Vision Screening: Recommended annual ophthalmology exams for early detection of glaucoma and other disorders of the eye. Is the patient up to date with their annual eye exam?  Yes  Who is the provider or what is the name of the office in which the patient attends annual eye exams? [facility] If pt is not established with a provider, would they like to be referred to a provider to establish care? No .   Dental Screening: Recommended annual dental exams for proper oral hygiene  Diabetic Foot Exam: n/a  Community Resource Referral / Chronic Care Management: CRR required this visit?  No   CCM required this visit?  No     Plan:     I have personally reviewed and noted the following in the patient's chart:   Medical and social history Use of alcohol, tobacco or illicit drugs  Current medications and supplements including opioid prescriptions.  Patient is not currently taking opioid prescriptions. Functional ability and status Nutritional status Physical activity Advanced directives List of other physicians Hospitalizations, surgeries, and ER visits in previous 12 months Vitals Screenings to include cognitive, depression, and falls Referrals and appointments  In addition, I have reviewed and discussed with patient certain preventive protocols, quality metrics, and best practice recommendations. A written personalized care plan for preventive services as well as general preventive health recommendations were provided to patient.     Barb Merino, LPN   [id number]/9562   After  Visit Summary: (MyChart) Due to this being a telephonic visit, the after visit summary with patients personalized plan was offered to patient via MyChart   Nurse Notes: none

## 2024-02-10 NOTE — Patient Instructions (Signed)
Brian [name] , Thank you for taking time to come for your Medicare Wellness Visit. I appreciate your ongoing commitment to your health goals. Please review the following plan we discussed and let me know if I can assist you in the future.   Referrals/Orders/Follow-Ups/Clinician Recommendations: none  This is a list of the screening recommended for you and due dates:  Health Maintenance  Topic Date Due   COVID-19 Vaccine (6 - 2024-25 season) 12/14/2023   Medicare Annual Wellness Visit  02/09/2025   Colon Cancer Screening  06/17/2025   DTaP/Tdap/Td vaccine (3 - Td or Tdap) 09/07/2028   Pneumonia Vaccine  Completed   Flu Shot  Completed   Hepatitis C Screening  Completed   Zoster (Shingles) Vaccine  Completed   HPV Vaccine  Aged Out    Advanced directives: (In Chart) A copy of your advanced directives are scanned into your chart should your provider ever need it.  Next Medicare Annual Wellness Visit scheduled for next year: Yes  insert Preventive Care attachment Insert FALL PREVENTION attachment if needed

## 2024-02-24 DIAGNOSIS — Z5189 Encounter for other specified aftercare: Secondary | ICD-10-CM | POA: Diagnosis not present

## 2024-02-26 NOTE — Progress Notes (Unsigned)
  Gastroenterology Return Visit   Referring Provider Pincus Sanes, MD [address] [place],  [place] [address]  Primary Care Provider Lawerance Bach, Bobette Mo, MD  Patient Profile: Brian [name] is a 78 y.o. male who returns to the [facility] for follow-up of the problem(s) noted below.  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 Russella Dar   Current GI Meds  Pantoprazole 40 mg orally daily  Interval History    Last colonoscopy: 05/2020 -LA grade B esophagitis, benign stenosis 36 cm from the incisors savory dilated to 14 mm, HH Last endoscopy: 05/2020 -  2 SSPs, left colon diverticula, EH  Last Abd CT/CTE/MRE: ***  GI Review of Symptoms Significant for {GIROS:50592}. Otherwise negative.  General Review of Systems  Review of systems is significant for the pertinent positives and negatives as listed per the HPI.  Full ROS is otherwise negative.  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  It was 5 mm in size.   A sessile polyp was found the the distal transverse colon.  It was 3 mm in size.   Moderate diverticulosis was found in the sigmoid colon."    COLONOSCOPY  06/17/2020    CYSTOSCOPY  10/26/2012   Procedure: CYSTOSCOPY FLEXIBLE;  Surgeon: Anner Crete, MD;  Location: [facility];  Service: Urology;  Laterality: N/A;   HEMORRHOID SURGERY  05-19-2006   PPH   POLYPECTOMY     RADIOACTIVE SEED IMPLANT  10/26/2012   Procedure: RADIOACTIVE SEED IMPLANT;  Surgeon: Anner Crete, MD;  Location: [facility];  Service: Urology;  Laterality: N/A;   TONSILLECTOMY  2360   78 years old   UPPER GASTROINTESTINAL ENDOSCOPY  06/17/2020   WRIST ARTHROSCOPY  03-27-2003   DEBRIDEMENT VOLAR ULNAR  & SCAPHOLUNATE LIGAMENT TEARS OF RIGHT WRIST     Allergies and Medications  No Known Allergies  @MEDSTODAY @  Family History   Family History  Problem Relation Age of Onset   Hypertension Father    Heart failure Father    Colon polyps Father    Diabetes Mother    Hypertension Mother    Hypertension Maternal Grandmother    Diabetes Maternal Grandmother    Stroke Maternal Grandfather        > 14   Bone cancer Paternal Grandfather    Colon cancer Neg Hx    Esophageal cancer Neg Hx    Rectal cancer Neg Hx    Stomach cancer Neg Hx    GI Specific Family History: {gifamhx:50061}   Social History   Social History  Tobacco Use   Smoking status: Never   Smokeless tobacco: Never  Vaping Use   Vaping status: Never Used  Substance Use Topics   Alcohol use: No   Drug use: No   [name] reports that he has never smoked. He has never used smokeless tobacco. He reports that he does not drink alcohol and does not use drugs.  Vital Signs and Physical Examination  There were no vitals filed for this visit. There is no height or weight on file to calculate BMI.    General: Well developed, well nourished, no acute distress Head: Normocephalic and atraumatic Eyes: Sclerae anicteric, EOMI Ears: Normal auditory acuity Mouth: No deformities or lesions noted Lungs: Clear throughout to auscultation Heart: Regular rate and rhythm; No murmurs, rubs or  bruits Abdomen: Soft, non tender and non distended. No masses, hepatosplenomegaly or hernias noted. Normal Bowel sounds Rectal: Musculoskeletal: Symmetrical with no gross deformities  Pulses:  Normal pulses noted Extremities: No edema or deformities noted Neurological: Alert oriented x 4, grossly nonfocal Psychological:  Alert and cooperative. Normal mood and affect   Review of Data  The following data was reviewed at the time of this encounter:  Laboratory Studies      Latest Ref Rng & Units 01/10/2024   10:11 AM 12/15/2022    8:42 AM 12/14/2021    8:51 AM  CBC  WBC 4.0 - 10.5 K/uL 5.0  6.3  5.7   Hemoglobin 13.0 - 17.0 g/dL 16.1  09.6  04.5   Hematocrit 39.0 - 52.0 % 43.6  44.0  46.3   Platelets 150.0 - 400.0 K/uL 202.0  225.0  195.0     Lab Results  Component Value Date   LIPASE 29 09/27/2020      Latest Ref Rng & Units 01/10/2024   10:11 AM 12/15/2022    8:42 AM 12/14/2021    8:51 AM  CMP  Glucose 70 - 99 mg/dL 89  94  87   BUN 6 - 23 mg/dL 18  17  14    Creatinine 0.40 - 1.50 mg/dL 4.09  8.11  9.14   Sodium 135 - 145 mEq/L 139  140  139   Potassium 3.5 - 5.1 mEq/L 4.6  4.7  4.7   Chloride 96 - 112 mEq/L 106  106  105   CO2 19 - 32 mEq/L 27  28  27    Calcium 8.4 - 10.5 mg/dL 9.6  9.6  9.5   Total Protein 6.0 - 8.3 g/dL 7.0  7.1  6.9   Total Bilirubin 0.2 - 1.2 mg/dL 0.9  0.8  0.8   Alkaline Phos 39 - 117 U/L 69  85  88   AST 0 - 37 U/L 20  18  24    ALT 0 - 53 U/L 14  15  23       Imaging Studies     GI Procedures and Studies  EGD/Colonoscopy 05/2020 EGD - LA grade B esophagitis, benign stenosis 36 cm from the incisors savory dilated to 14 mm, HH Colonoscopy - 2 SSPs, left colon diverticula, EH  Colonoscopy 05/2015 Presumed polyp was lymphoid aggregate, left colon diverticula, AH  Colonoscopy 05/2010 2 sessile polyps, moderate diverticulosis in [place], IH  Colonoscopy 04/2005 Left colon diverticula, EH  Colonoscopy 04/2002 1 sessile polyp -hyperplastic  and lymphoid aggregate, left colon diverticula, IH  Colonoscopy 02/2001 2 polyps removed on histopathology benign granulation tissue and lymphoid nodule  Clinical Impression  It is my clinical impression that Brian [name] is  a 78 y.o. 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  The patient or caregiver verbalized understanding of the material covered, with no barriers to understanding. All questions were answered. Patient or caregiver is agreeable with the plan outlined above.    It was a pleasure to see Brian [name].  If you have any questions or concerns regarding this evaluation, do not hesitate to contact me.  Maren Beach, MD [facility]

## 2024-02-27 ENCOUNTER — Ambulatory Visit: Payer: Medicare PPO | Admitting: Pediatrics

## 2024-02-27 ENCOUNTER — Encounter: Payer: Self-pay | Admitting: Pediatrics

## 2024-02-27 VITALS — BP 122/80 | HR 53 | Ht 67.0 in | Wt 153.0 lb

## 2024-02-27 DIAGNOSIS — Z860100 Personal history of colon polyps, unspecified: Secondary | ICD-10-CM

## 2024-02-27 DIAGNOSIS — K449 Diaphragmatic hernia without obstruction or gangrene: Secondary | ICD-10-CM

## 2024-02-27 DIAGNOSIS — Z862 Personal history of diseases of the blood and blood-forming organs and certain disorders involving the immune mechanism: Secondary | ICD-10-CM | POA: Diagnosis not present

## 2024-02-27 DIAGNOSIS — K222 Esophageal obstruction: Secondary | ICD-10-CM

## 2024-02-27 DIAGNOSIS — Z860101 Personal history of adenomatous and serrated colon polyps: Secondary | ICD-10-CM

## 2024-02-27 DIAGNOSIS — K2100 Gastro-esophageal reflux disease with esophagitis, without bleeding: Secondary | ICD-10-CM

## 2024-02-27 MED ORDER — PANTOPRAZOLE SODIUM 40 MG PO TBEC: 40.0000 mg | DELAYED_RELEASE_TABLET | Freq: Every day | ORAL | 3 refills | Status: DC

## 2024-02-27 NOTE — Patient Instructions (Addendum)
 _______________________________________________________  If your blood pressure at your visit was 140/90 or greater, please contact your primary care physician to follow up on this.  _______________________________________________________  If you are age 76 or older, your body mass index should be between 23-30. Your Body mass index is 23.96 kg/m. If this is out of the aforementioned range listed, please consider follow up with your Primary Care Provider.  If you are age 83 or younger, your body mass index should be between 19-25. Your Body mass index is 23.96 kg/m. If this is out of the aformentioned range listed, please consider follow up with your Primary Care Provider.   ________________________________________________________  The [facility] providers would like to encourage you to use [facility] to communicate with providers for non-urgent requests or questions.  Due to long hold times on the telephone, sending your provider a message by [facility] may be a faster and more efficient way to get a response.  Please allow 48 business hours for a response.  Please remember that this is for non-urgent requests.  _______________________________________________________  We have sent the following medications to your pharmacy for you to pick up at your convenience: Protonix  Please follow up in 12 months. Give [place] a call at [phone] to schedule an appointment.  It was a pleasure to see you today!  Thank you for trusting me with your gastrointestinal care!

## 2024-02-29 DIAGNOSIS — L820 Inflamed seborrheic keratosis: Secondary | ICD-10-CM | POA: Diagnosis not present

## 2024-03-22 DIAGNOSIS — Z5189 Encounter for other specified aftercare: Secondary | ICD-10-CM | POA: Diagnosis not present

## 2024-04-04 DIAGNOSIS — M1611 Unilateral primary osteoarthritis, right hip: Secondary | ICD-10-CM | POA: Diagnosis not present

## 2024-04-19 DIAGNOSIS — Z5189 Encounter for other specified aftercare: Secondary | ICD-10-CM | POA: Diagnosis not present

## 2024-04-24 DIAGNOSIS — M25551 Pain in right hip: Secondary | ICD-10-CM | POA: Diagnosis not present

## 2024-05-03 DIAGNOSIS — M25551 Pain in right hip: Secondary | ICD-10-CM | POA: Diagnosis not present

## 2024-05-09 DIAGNOSIS — Z5189 Encounter for other specified aftercare: Secondary | ICD-10-CM | POA: Diagnosis not present

## 2024-05-10 DIAGNOSIS — M25551 Pain in right hip: Secondary | ICD-10-CM | POA: Diagnosis not present

## 2024-06-04 DIAGNOSIS — L237 Allergic contact dermatitis due to plants, except food: Secondary | ICD-10-CM | POA: Diagnosis not present

## 2024-06-07 DIAGNOSIS — Z5189 Encounter for other specified aftercare: Secondary | ICD-10-CM | POA: Diagnosis not present

## 2024-06-14 DIAGNOSIS — J3089 Other allergic rhinitis: Secondary | ICD-10-CM | POA: Diagnosis not present

## 2024-07-05 DIAGNOSIS — Z5189 Encounter for other specified aftercare: Secondary | ICD-10-CM | POA: Diagnosis not present

## 2024-07-09 DIAGNOSIS — Z5189 Encounter for other specified aftercare: Secondary | ICD-10-CM | POA: Diagnosis not present

## 2024-07-12 DIAGNOSIS — Z5189 Encounter for other specified aftercare: Secondary | ICD-10-CM | POA: Diagnosis not present

## 2024-07-16 DIAGNOSIS — Z5189 Encounter for other specified aftercare: Secondary | ICD-10-CM | POA: Diagnosis not present

## 2024-07-19 DIAGNOSIS — Z5189 Encounter for other specified aftercare: Secondary | ICD-10-CM | POA: Diagnosis not present

## 2024-08-15 DIAGNOSIS — Z5189 Encounter for other specified aftercare: Secondary | ICD-10-CM | POA: Diagnosis not present

## 2024-09-05 DIAGNOSIS — D1801 Hemangioma of skin and subcutaneous tissue: Secondary | ICD-10-CM | POA: Diagnosis not present

## 2024-09-05 DIAGNOSIS — L814 Other melanin hyperpigmentation: Secondary | ICD-10-CM | POA: Diagnosis not present

## 2024-09-05 DIAGNOSIS — D235 Other benign neoplasm of skin of trunk: Secondary | ICD-10-CM | POA: Diagnosis not present

## 2024-09-05 DIAGNOSIS — D229 Melanocytic nevi, unspecified: Secondary | ICD-10-CM | POA: Diagnosis not present

## 2024-09-05 DIAGNOSIS — L821 Other seborrheic keratosis: Secondary | ICD-10-CM | POA: Diagnosis not present

## 2024-09-05 DIAGNOSIS — L578 Other skin changes due to chronic exposure to nonionizing radiation: Secondary | ICD-10-CM | POA: Diagnosis not present

## 2024-09-12 DIAGNOSIS — Z5189 Encounter for other specified aftercare: Secondary | ICD-10-CM | POA: Diagnosis not present

## 2024-10-01 DIAGNOSIS — G43719 Chronic migraine without aura, intractable, without status migrainosus: Secondary | ICD-10-CM | POA: Diagnosis not present

## 2024-10-10 DIAGNOSIS — Z5189 Encounter for other specified aftercare: Secondary | ICD-10-CM | POA: Diagnosis not present

## 2024-11-15 DIAGNOSIS — M21622 Bunionette of left foot: Secondary | ICD-10-CM | POA: Diagnosis not present

## 2025-01-10 DIAGNOSIS — I2510 Atherosclerotic heart disease of native coronary artery without angina pectoris: Secondary | ICD-10-CM | POA: Insufficient documentation

## 2025-01-10 NOTE — Progress Notes (Signed)
 "   Subjective:    Patient ID: Brian [name], male    DOB: 05/22/1946, 79 y.o.   MRN: [id number]     HPI Brian [name] is here for a physical exam and his chronic medical problems.  Doing well.  Has right labram tear - no pain currently.  Short course of meloxicam or his foot improved the pain.    Medications and allergies reviewed with patient and updated if appropriate.  Medications Ordered Prior to Encounter[1]  Review of Systems  Constitutional:  Negative for fever.  Eyes:  Negative for visual disturbance.  Respiratory:  Negative for cough, shortness of breath and wheezing.   Cardiovascular:  Negative for chest pain, palpitations and leg swelling.  Gastrointestinal:  Negative for abdominal pain, blood in stool, constipation and diarrhea.       No gerd  Genitourinary:  Negative for difficulty urinating and dysuria.  Musculoskeletal:  Positive for arthralgias (right hip labrum tear, right shoulder). Negative for back pain.  Skin:  Negative for rash.  Neurological:  Positive for dizziness (with quick movements - transient). Negative for light-headedness, numbness and headaches.  Psychiatric/Behavioral:  Negative for dysphoric mood and sleep disturbance. The patient is not nervous/anxious.        Objective:   Vitals:   01/11/25 0848  BP: 120/80  Pulse: 76  Temp: 98 F (36.7 C)  SpO2: 98%   Filed Weights   01/11/25 0848  Weight: 151 lb (68.5 kg)   Body mass index is 23.65 kg/m.  BP Readings from Last 3 Encounters:  01/11/25 120/80  02/27/24 122/80  01/10/24 122/78    Wt Readings from Last 3 Encounters:  01/11/25 151 lb (68.5 kg)  02/27/24 153 lb (69.4 kg)  01/10/24 155 lb (70.3 kg)      Physical Exam Constitutional: He appears well-developed and well-nourished. No distress.  HENT:  Head: Normocephalic and atraumatic.  Right Ear: External ear normal.  Left Ear: External ear normal.  Normal ear canals and TM b/l  Mouth/Throat: Oropharynx is clear and  moist. Eyes: Conjunctivae and EOM are normal.  Neck: Neck supple. No tracheal deviation present. No thyromegaly present.  No carotid bruit  Cardiovascular: Normal rate, regular rhythm, normal heart sounds and intact distal pulses.   No murmur heard.  No lower extremity edema. Pulmonary/Chest: Effort normal and breath sounds normal. No respiratory distress. He has no wheezes. He has no rales.  Abdominal: Soft. He exhibits no distension. There is no tenderness.  Genitourinary: deferred  Lymphadenopathy:   He has no cervical adenopathy.  Skin: Skin is warm and dry. He is not diaphoretic.  Psychiatric: He has a normal mood and affect. His behavior is normal.         Assessment & Plan:   Physical exam: Screening blood work  ordered Exercise   regular - gym Weight normal Substance abuse   none   Reviewed recommended immunizations.   Health Maintenance  Topic Date Due   Medicare Annual Wellness (AWV)  02/09/2025   COVID-19 Vaccine (7 - 2025-26 season) 01/27/2025 (Originally 08/27/2024)   Colonoscopy  06/17/2025   DTaP/Tdap/Td (3 - Td or Tdap) 09/07/2028   Pneumococcal Vaccine: 50+ Years  Completed   Influenza Vaccine  Completed   Hepatitis C Screening  Completed   Zoster Vaccines- Shingrix  Completed   Meningococcal B Vaccine  Aged Out     See Problem List for Assessment and Plan of chronic medical problems.      [1]  Current Outpatient Medications  on File Prior to Visit  Medication Sig Dispense Refill   EPIPEN 2-PAK 0.3 MG/0.3ML SOAJ injection Inject 1 mg into the muscle as needed.     Multiple Vitamins-Minerals (MACUVITE PO) Take 1 tablet by mouth daily.     pantoprazole  (PROTONIX ) 40 MG tablet TAKE 1 TABLET BY MOUTH EVERY DAY 90 tablet 0   PRESCRIPTION MEDICATION Inject into the muscle every 30 (thirty) days. ALLERGY INJECTION'S MONTHLY     SUMAtriptan (IMITREX) 100 MG tablet Take 25 mg by mouth every 2 (two) hours as needed.      vitamin C (ASCORBIC ACID) 500 MG  tablet Take 500 mg by mouth daily.      No current facility-administered medications on file prior to visit.   "

## 2025-01-10 NOTE — Assessment & Plan Note (Addendum)
 Chronic Minimal plaque Lab Results  Component Value Date   LDLCALC 86 01/10/2024   Check lipid panel, CMP, CBC, TSH Continue healthy diet, regular exercise Discussed considering repeat CT CAC next year

## 2025-01-10 NOTE — Patient Instructions (Addendum)
 "     Blood work was ordered.       Medications changes include :   None     Return in about 1 year (around 01/11/2026) for Physical Exam.     Health Maintenance, Male Adopting a healthy lifestyle and getting preventive care are important in promoting health and wellness. Ask your health care provider about: The right schedule for you to have regular tests and exams. Things you can do on your own to prevent diseases and keep yourself healthy. What should I know about diet, weight, and exercise? Eat a healthy diet  Eat a diet that includes plenty of vegetables, fruits, low-fat dairy products, and lean protein. Do not eat a lot of foods that are high in solid fats, added sugars, or sodium. Maintain a healthy weight Body mass index (BMI) is a measurement that can be used to identify possible weight problems. It estimates body fat based on height and weight. Your health care provider can help determine your BMI and help you achieve or maintain a healthy weight. Get regular exercise Get regular exercise. This is one of the most important things you can do for your health. Most adults should: Exercise for at least 150 minutes each week. The exercise should increase your heart rate and make you sweat (moderate-intensity exercise). Do strengthening exercises at least twice a week. This is in addition to the moderate-intensity exercise. Spend less time sitting. Even light physical activity can be beneficial. Watch cholesterol and blood lipids Have your blood tested for lipids and cholesterol at 77 years of age, then have this test every 5 years. You may need to have your cholesterol levels checked more often if: Your lipid or cholesterol levels are high. You are older than 77 years of age. You are at high risk for heart disease. What should I know about cancer screening? Many types of cancers can be detected early and may often be prevented. Depending on your health history and family  history, you may need to have cancer screening at various ages. This may include screening for: Colorectal cancer. Prostate cancer. Skin cancer. Lung cancer. What should I know about heart disease, diabetes, and high blood pressure? Blood pressure and heart disease High blood pressure causes heart disease and increases the risk of stroke. This is more likely to develop in people who have high blood pressure readings or are overweight. Talk with your health care provider about your target blood pressure readings. Have your blood pressure checked: Every 3-5 years if you are 110-43 years of age. Every year if you are 50 years old or older. If you are between the ages of 6 and 75 and are a current or former smoker, ask your health care provider if you should have a one-time screening for abdominal aortic aneurysm (AAA). Diabetes Have regular diabetes screenings. This checks your fasting blood sugar level. Have the screening done: Once every three years after age 70 if you are at a normal weight and have a low risk for diabetes. More often and at a younger age if you are overweight or have a high risk for diabetes. What should I know about preventing infection? Hepatitis B If you have a higher risk for hepatitis B, you should be screened for this virus. Talk with your health care provider to find out if you are at risk for hepatitis B infection. Hepatitis C Blood testing is recommended for: Everyone born from [birth year] through 1965. Anyone with known risk factors for hepatitis  C. Sexually transmitted infections (STIs) You should be screened each year for STIs, including gonorrhea and chlamydia, if: You are sexually active and are younger than 77 years of age. You are older than 77 years of age and your health care provider tells you that you are at risk for this type of infection. Your sexual activity has changed since you were last screened, and you are at increased risk for chlamydia or  gonorrhea. Ask your health care provider if you are at risk. Ask your health care provider about whether you are at high risk for HIV. Your health care provider may recommend a prescription medicine to help prevent HIV infection. If you choose to take medicine to prevent HIV, you should first get tested for HIV. You should then be tested every 3 months for as long as you are taking the medicine. Follow these instructions at home: Alcohol use Do not drink alcohol if your health care provider tells you not to drink. If you drink alcohol: Limit how much you have to 0-2 drinks a day. Know how much alcohol is in your drink. In the [place] one drink equals one 12 oz bottle of beer (355 mL), one 5 oz glass of wine (148 mL), or one 1 oz glass of hard liquor (44 mL). Lifestyle Do not use any products that contain nicotine or tobacco. These products include cigarettes, chewing tobacco, and vaping devices, such as e-cigarettes. If you need help quitting, ask your health care provider. Do not use street drugs. Do not share needles. Ask your health care provider for help if you need support or information about quitting drugs. General instructions Schedule regular health, dental, and eye exams. Stay current with your vaccines. Tell your health care provider if: You often feel depressed. You have ever been abused or do not feel safe at home. Summary Adopting a healthy lifestyle and getting preventive care are important in promoting health and wellness. Follow your health care provider's instructions about healthy diet, exercising, and getting tested or screened for diseases. Follow your health care provider's instructions on monitoring your cholesterol and blood pressure. This information is not intended to replace advice given to you by your health care provider. Make sure you discuss any questions you have with your health care provider. Document Revised: 05/04/2021 Document Reviewed: 05/04/2021 Elsevier  Patient Education  2024 [employer]. "

## 2025-01-11 ENCOUNTER — Ambulatory Visit: Payer: Medicare PPO | Admitting: Internal Medicine

## 2025-01-11 VITALS — BP 120/80 | HR 76 | Temp 98.0°F | Ht 67.0 in | Wt 151.0 lb

## 2025-01-11 DIAGNOSIS — I2510 Atherosclerotic heart disease of native coronary artery without angina pectoris: Secondary | ICD-10-CM

## 2025-01-11 DIAGNOSIS — Z0000 Encounter for general adult medical examination without abnormal findings: Secondary | ICD-10-CM | POA: Diagnosis not present

## 2025-01-11 DIAGNOSIS — G43809 Other migraine, not intractable, without status migrainosus: Secondary | ICD-10-CM

## 2025-01-11 DIAGNOSIS — K2100 Gastro-esophageal reflux disease with esophagitis, without bleeding: Secondary | ICD-10-CM

## 2025-01-11 DIAGNOSIS — Z8546 Personal history of malignant neoplasm of prostate: Secondary | ICD-10-CM | POA: Diagnosis not present

## 2025-01-11 LAB — LIPID PANEL
Cholesterol: 142 mg/dL (ref 28–200)
HDL: 41.9 mg/dL
LDL Cholesterol: 83 mg/dL (ref 10–99)
NonHDL: 99.64
Total CHOL/HDL Ratio: 3
Triglycerides: 83 mg/dL (ref 10.0–149.0)
VLDL: 16.6 mg/dL (ref 0.0–40.0)

## 2025-01-11 LAB — COMPREHENSIVE METABOLIC PANEL WITH GFR
ALT: 14 U/L (ref 3–53)
AST: 17 U/L (ref 5–37)
Albumin: 4.6 g/dL (ref 3.5–5.2)
Alkaline Phosphatase: 70 U/L (ref 39–117)
BUN: 17 mg/dL (ref 6–23)
CO2: 28 meq/L (ref 19–32)
Calcium: 9.2 mg/dL (ref 8.4–10.5)
Chloride: 106 meq/L (ref 96–112)
Creatinine, Ser: 0.99 mg/dL (ref 0.40–1.50)
GFR: 74.01 mL/min
Glucose, Bld: 85 mg/dL (ref 70–99)
Potassium: 4 meq/L (ref 3.5–5.1)
Sodium: 140 meq/L (ref 135–145)
Total Bilirubin: 0.8 mg/dL (ref 0.2–1.2)
Total Protein: 7 g/dL (ref 6.0–8.3)

## 2025-01-11 LAB — CBC
HCT: 41.2 % (ref 39.0–52.0)
Hemoglobin: 14.2 g/dL (ref 13.0–17.0)
MCHC: 34.4 g/dL (ref 30.0–36.0)
MCV: 87.6 fl (ref 78.0–100.0)
Platelets: 208 K/uL (ref 150.0–400.0)
RBC: 4.7 Mil/uL (ref 4.22–5.81)
RDW: 13.4 % (ref 11.5–15.5)
WBC: 5.1 K/uL (ref 4.0–10.5)

## 2025-01-11 LAB — TSH: TSH: 3.34 u[IU]/mL (ref 0.35–5.50)

## 2025-01-11 NOTE — Assessment & Plan Note (Signed)
 Chronic Following with GI Moderate hiatal hernia GERD controlled Continue pantoprazole 40 mg daily

## 2025-01-11 NOTE — Assessment & Plan Note (Signed)
 History of prostate cancer S/p radiation seed implant 2013 Following with urology No evidence of recurrence

## 2025-01-11 NOTE — Assessment & Plan Note (Addendum)
 Chronic Controlled-infrequent migraines Sees Dr. Oneita Mask Imitrex as needed

## 2025-01-12 ENCOUNTER — Ambulatory Visit: Payer: Self-pay | Admitting: Internal Medicine

## 2025-01-14 ENCOUNTER — Encounter: Payer: Self-pay | Admitting: Internal Medicine

## 2025-02-11 ENCOUNTER — Ambulatory Visit: Payer: Medicare PPO

## 2025-03-04 ENCOUNTER — Ambulatory Visit: Admitting: Pediatrics

## 2026-01-14 ENCOUNTER — Encounter: Admitting: Internal Medicine
